# Patient Record
Sex: Female | Born: 1935 | Race: White | Hispanic: No | Marital: Married | Smoking: Never smoker
Health system: Southern US, Community
[De-identification: ages and names within clinical notes are randomized; demographics above are authoritative.]

## PROBLEM LIST (undated history)

## (undated) DIAGNOSIS — K760 Fatty (change of) liver, not elsewhere classified: Secondary | ICD-10-CM

## (undated) DIAGNOSIS — R143 Flatulence: Secondary | ICD-10-CM

## (undated) DIAGNOSIS — I10 Essential (primary) hypertension: Secondary | ICD-10-CM

## (undated) DIAGNOSIS — R0602 Shortness of breath: Secondary | ICD-10-CM

## (undated) DIAGNOSIS — C801 Malignant (primary) neoplasm, unspecified: Secondary | ICD-10-CM

## (undated) DIAGNOSIS — I712 Thoracic aortic aneurysm, without rupture: Secondary | ICD-10-CM

## (undated) DIAGNOSIS — J189 Pneumonia, unspecified organism: Secondary | ICD-10-CM

## (undated) DIAGNOSIS — H269 Unspecified cataract: Secondary | ICD-10-CM

## (undated) DIAGNOSIS — I499 Cardiac arrhythmia, unspecified: Secondary | ICD-10-CM

## (undated) DIAGNOSIS — N951 Menopausal and female climacteric states: Secondary | ICD-10-CM

## (undated) DIAGNOSIS — E7800 Pure hypercholesterolemia, unspecified: Secondary | ICD-10-CM

## (undated) DIAGNOSIS — R112 Nausea with vomiting, unspecified: Secondary | ICD-10-CM

## (undated) DIAGNOSIS — K259 Gastric ulcer, unspecified as acute or chronic, without hemorrhage or perforation: Secondary | ICD-10-CM

## (undated) DIAGNOSIS — K2940 Chronic atrophic gastritis without bleeding: Secondary | ICD-10-CM

## (undated) DIAGNOSIS — R059 Cough, unspecified: Secondary | ICD-10-CM

## (undated) DIAGNOSIS — N390 Urinary tract infection, site not specified: Secondary | ICD-10-CM

## (undated) DIAGNOSIS — K861 Other chronic pancreatitis: Secondary | ICD-10-CM

## (undated) DIAGNOSIS — R141 Gas pain: Secondary | ICD-10-CM

## (undated) DIAGNOSIS — K449 Diaphragmatic hernia without obstruction or gangrene: Secondary | ICD-10-CM

## (undated) DIAGNOSIS — C50919 Malignant neoplasm of unspecified site of unspecified female breast: Secondary | ICD-10-CM

## (undated) DIAGNOSIS — R142 Eructation: Secondary | ICD-10-CM

## (undated) DIAGNOSIS — M1990 Unspecified osteoarthritis, unspecified site: Secondary | ICD-10-CM

## (undated) DIAGNOSIS — T8859XA Other complications of anesthesia, initial encounter: Secondary | ICD-10-CM

## (undated) DIAGNOSIS — T4145XA Adverse effect of unspecified anesthetic, initial encounter: Secondary | ICD-10-CM

## (undated) DIAGNOSIS — R05 Cough: Secondary | ICD-10-CM

## (undated) DIAGNOSIS — R0789 Other chest pain: Secondary | ICD-10-CM

## (undated) DIAGNOSIS — IMO0002 Reserved for concepts with insufficient information to code with codable children: Secondary | ICD-10-CM

## (undated) DIAGNOSIS — Z9889 Other specified postprocedural states: Secondary | ICD-10-CM

## (undated) DIAGNOSIS — K589 Irritable bowel syndrome without diarrhea: Secondary | ICD-10-CM

## (undated) DIAGNOSIS — D126 Benign neoplasm of colon, unspecified: Secondary | ICD-10-CM

## (undated) DIAGNOSIS — Z923 Personal history of irradiation: Secondary | ICD-10-CM

## (undated) DIAGNOSIS — Z800 Family history of malignant neoplasm of digestive organs: Secondary | ICD-10-CM

## (undated) DIAGNOSIS — J029 Acute pharyngitis, unspecified: Secondary | ICD-10-CM

## (undated) DIAGNOSIS — K219 Gastro-esophageal reflux disease without esophagitis: Secondary | ICD-10-CM

## (undated) DIAGNOSIS — J40 Bronchitis, not specified as acute or chronic: Secondary | ICD-10-CM

## (undated) DIAGNOSIS — R490 Dysphonia: Secondary | ICD-10-CM

## (undated) HISTORY — PX: CATARACT EXTRACTION: SUR2

## (undated) HISTORY — DX: Dysphonia: R49.0

## (undated) HISTORY — DX: Malignant neoplasm of unspecified site of unspecified female breast: C50.919

## (undated) HISTORY — DX: Family history of malignant neoplasm of digestive organs: Z80.0

## (undated) HISTORY — DX: Essential (primary) hypertension: I10

## (undated) HISTORY — PX: BREAST SURGERY: SHX581

## (undated) HISTORY — DX: Unspecified cataract: H26.9

## (undated) HISTORY — DX: Flatulence: R14.3

## (undated) HISTORY — DX: Acute pharyngitis, unspecified: J02.9

## (undated) HISTORY — DX: Chronic atrophic gastritis without bleeding: K29.40

## (undated) HISTORY — PX: EYE SURGERY: SHX253

## (undated) HISTORY — DX: Gastric ulcer, unspecified as acute or chronic, without hemorrhage or perforation: K25.9

## (undated) HISTORY — PX: SKIN CANCER EXCISION: SHX779

## (undated) HISTORY — DX: Cough: R05

## (undated) HISTORY — DX: Other chest pain: R07.89

## (undated) HISTORY — DX: Benign neoplasm of colon, unspecified: D12.6

## (undated) HISTORY — PX: COLONOSCOPY: SHX174

## (undated) HISTORY — DX: Thoracic aortic aneurysm, without rupture: I71.2

## (undated) HISTORY — DX: Fatty (change of) liver, not elsewhere classified: K76.0

## (undated) HISTORY — DX: Gastro-esophageal reflux disease without esophagitis: K21.9

## (undated) HISTORY — DX: Other chronic pancreatitis: K86.1

## (undated) HISTORY — DX: Eructation: R14.2

## (undated) HISTORY — DX: Malignant (primary) neoplasm, unspecified: C80.1

## (undated) HISTORY — DX: Pneumonia, unspecified organism: J18.9

## (undated) HISTORY — DX: Urinary tract infection, site not specified: N39.0

## (undated) HISTORY — DX: Flatulence: R14.1

## (undated) HISTORY — DX: Cardiac arrhythmia, unspecified: I49.9

## (undated) HISTORY — DX: Bronchitis, not specified as acute or chronic: J40

## (undated) HISTORY — DX: Cough, unspecified: R05.9

## (undated) HISTORY — PX: HEMORROIDECTOMY: SUR656

## (undated) HISTORY — DX: Reserved for concepts with insufficient information to code with codable children: IMO0002

## (undated) HISTORY — DX: Pure hypercholesterolemia, unspecified: E78.00

## (undated) HISTORY — DX: Irritable bowel syndrome, unspecified: K58.9

## (undated) HISTORY — DX: Diaphragmatic hernia without obstruction or gangrene: K44.9

## (undated) HISTORY — DX: Menopausal and female climacteric states: N95.1

---

## 1997-12-31 ENCOUNTER — Other Ambulatory Visit: Admission: RE | Admit: 1997-12-31 | Discharge: 1997-12-31 | Payer: Self-pay | Admitting: Obstetrics and Gynecology

## 1999-01-02 ENCOUNTER — Other Ambulatory Visit: Admission: RE | Admit: 1999-01-02 | Discharge: 1999-01-02 | Payer: Self-pay | Admitting: Obstetrics and Gynecology

## 1999-08-11 ENCOUNTER — Encounter: Admission: RE | Admit: 1999-08-11 | Discharge: 1999-08-11 | Payer: Self-pay | Admitting: Obstetrics and Gynecology

## 1999-08-11 ENCOUNTER — Encounter: Payer: Self-pay | Admitting: Obstetrics and Gynecology

## 2000-02-12 ENCOUNTER — Other Ambulatory Visit: Admission: RE | Admit: 2000-02-12 | Discharge: 2000-02-12 | Payer: Self-pay | Admitting: Obstetrics and Gynecology

## 2000-10-26 ENCOUNTER — Encounter: Payer: Self-pay | Admitting: Obstetrics and Gynecology

## 2000-10-26 ENCOUNTER — Encounter: Admission: RE | Admit: 2000-10-26 | Discharge: 2000-10-26 | Payer: Self-pay | Admitting: Obstetrics and Gynecology

## 2001-03-22 ENCOUNTER — Other Ambulatory Visit: Admission: RE | Admit: 2001-03-22 | Discharge: 2001-03-22 | Payer: Self-pay | Admitting: Obstetrics and Gynecology

## 2002-04-13 ENCOUNTER — Other Ambulatory Visit: Admission: RE | Admit: 2002-04-13 | Discharge: 2002-04-13 | Payer: Self-pay | Admitting: Obstetrics and Gynecology

## 2003-01-22 ENCOUNTER — Encounter: Payer: Self-pay | Admitting: Obstetrics and Gynecology

## 2003-01-22 ENCOUNTER — Encounter: Admission: RE | Admit: 2003-01-22 | Discharge: 2003-01-22 | Payer: Self-pay | Admitting: Obstetrics and Gynecology

## 2003-01-25 ENCOUNTER — Encounter: Admission: RE | Admit: 2003-01-25 | Discharge: 2003-01-25 | Payer: Self-pay | Admitting: Obstetrics and Gynecology

## 2003-01-25 ENCOUNTER — Encounter: Payer: Self-pay | Admitting: Obstetrics and Gynecology

## 2003-02-26 ENCOUNTER — Encounter: Admission: RE | Admit: 2003-02-26 | Discharge: 2003-02-26 | Payer: Self-pay | Admitting: Obstetrics and Gynecology

## 2003-02-26 ENCOUNTER — Encounter (INDEPENDENT_AMBULATORY_CARE_PROVIDER_SITE_OTHER): Payer: Self-pay | Admitting: *Deleted

## 2003-02-26 ENCOUNTER — Encounter: Payer: Self-pay | Admitting: Obstetrics and Gynecology

## 2003-03-01 ENCOUNTER — Encounter (HOSPITAL_COMMUNITY): Admission: RE | Admit: 2003-03-01 | Discharge: 2003-05-30 | Payer: Self-pay | Admitting: General Surgery

## 2003-03-01 ENCOUNTER — Encounter: Payer: Self-pay | Admitting: General Surgery

## 2003-03-02 ENCOUNTER — Encounter: Payer: Self-pay | Admitting: General Surgery

## 2003-03-20 ENCOUNTER — Encounter (INDEPENDENT_AMBULATORY_CARE_PROVIDER_SITE_OTHER): Payer: Self-pay | Admitting: General Surgery

## 2003-03-20 ENCOUNTER — Encounter (INDEPENDENT_AMBULATORY_CARE_PROVIDER_SITE_OTHER): Payer: Self-pay | Admitting: Specialist

## 2003-03-21 ENCOUNTER — Ambulatory Visit (HOSPITAL_BASED_OUTPATIENT_CLINIC_OR_DEPARTMENT_OTHER): Admission: RE | Admit: 2003-03-21 | Discharge: 2003-03-21 | Payer: Self-pay | Admitting: General Surgery

## 2003-03-21 ENCOUNTER — Encounter: Admission: RE | Admit: 2003-03-21 | Discharge: 2003-03-21 | Payer: Self-pay | Admitting: General Surgery

## 2003-03-21 ENCOUNTER — Encounter: Payer: Self-pay | Admitting: General Surgery

## 2003-03-23 HISTORY — PX: BREAST LUMPECTOMY: SHX2

## 2003-04-27 ENCOUNTER — Ambulatory Visit: Admission: RE | Admit: 2003-04-27 | Discharge: 2003-07-04 | Payer: Self-pay | Admitting: Radiation Oncology

## 2003-05-15 ENCOUNTER — Other Ambulatory Visit: Admission: RE | Admit: 2003-05-15 | Discharge: 2003-05-15 | Payer: Self-pay | Admitting: Obstetrics and Gynecology

## 2003-08-07 ENCOUNTER — Ambulatory Visit: Admission: RE | Admit: 2003-08-07 | Discharge: 2003-08-07 | Payer: Self-pay | Admitting: Radiation Oncology

## 2003-10-30 ENCOUNTER — Encounter: Admission: RE | Admit: 2003-10-30 | Discharge: 2003-10-30 | Payer: Self-pay | Admitting: Oncology

## 2004-01-23 ENCOUNTER — Encounter: Admission: RE | Admit: 2004-01-23 | Discharge: 2004-01-23 | Payer: Self-pay | Admitting: Oncology

## 2004-08-18 ENCOUNTER — Ambulatory Visit: Payer: Self-pay | Admitting: Oncology

## 2004-08-26 ENCOUNTER — Other Ambulatory Visit: Admission: RE | Admit: 2004-08-26 | Discharge: 2004-08-26 | Payer: Self-pay | Admitting: Obstetrics and Gynecology

## 2004-09-23 ENCOUNTER — Ambulatory Visit: Payer: Self-pay | Admitting: Internal Medicine

## 2004-10-13 ENCOUNTER — Ambulatory Visit: Payer: Self-pay | Admitting: Internal Medicine

## 2004-10-28 ENCOUNTER — Ambulatory Visit: Payer: Self-pay | Admitting: Gastroenterology

## 2005-01-23 ENCOUNTER — Encounter: Admission: RE | Admit: 2005-01-23 | Discharge: 2005-01-23 | Payer: Self-pay | Admitting: Oncology

## 2005-02-06 ENCOUNTER — Ambulatory Visit: Payer: Self-pay | Admitting: Oncology

## 2005-07-23 ENCOUNTER — Ambulatory Visit (HOSPITAL_COMMUNITY): Admission: RE | Admit: 2005-07-23 | Discharge: 2005-07-23 | Payer: Self-pay | Admitting: Obstetrics and Gynecology

## 2005-07-23 ENCOUNTER — Encounter (INDEPENDENT_AMBULATORY_CARE_PROVIDER_SITE_OTHER): Payer: Self-pay | Admitting: Specialist

## 2005-09-17 ENCOUNTER — Ambulatory Visit: Payer: Self-pay | Admitting: Oncology

## 2005-10-22 ENCOUNTER — Other Ambulatory Visit: Admission: RE | Admit: 2005-10-22 | Discharge: 2005-10-22 | Payer: Self-pay | Admitting: Obstetrics and Gynecology

## 2006-01-15 ENCOUNTER — Ambulatory Visit: Payer: Self-pay | Admitting: Gastroenterology

## 2006-01-28 ENCOUNTER — Encounter: Admission: RE | Admit: 2006-01-28 | Discharge: 2006-01-28 | Payer: Self-pay | Admitting: Oncology

## 2006-03-04 ENCOUNTER — Ambulatory Visit: Payer: Self-pay | Admitting: Gastroenterology

## 2006-03-05 ENCOUNTER — Ambulatory Visit: Payer: Self-pay | Admitting: Gastroenterology

## 2006-03-10 ENCOUNTER — Ambulatory Visit: Payer: Self-pay | Admitting: Oncology

## 2006-03-10 LAB — CBC WITH DIFFERENTIAL/PLATELET
BASO%: 0.7 % (ref 0.0–2.0)
EOS%: 1 % (ref 0.0–7.0)
HCT: 32.5 % — ABNORMAL LOW (ref 34.8–46.6)
LYMPH%: 43.7 % (ref 14.0–48.0)
MCH: 31.5 pg (ref 26.0–34.0)
MCHC: 34.4 g/dL (ref 32.0–36.0)
MCV: 91.6 fL (ref 81.0–101.0)
MONO%: 8.8 % (ref 0.0–13.0)
NEUT%: 45.8 % (ref 39.6–76.8)
lymph#: 2.2 10*3/uL (ref 0.9–3.3)

## 2006-03-10 LAB — COMPREHENSIVE METABOLIC PANEL
AST: 15 U/L (ref 0–37)
Alkaline Phosphatase: 48 U/L (ref 39–117)
BUN: 15 mg/dL (ref 6–23)
Creatinine, Ser: 0.86 mg/dL (ref 0.40–1.20)
Glucose, Bld: 105 mg/dL — ABNORMAL HIGH (ref 70–99)
Potassium: 4.1 mEq/L (ref 3.5–5.3)
Total Bilirubin: 0.4 mg/dL (ref 0.3–1.2)

## 2006-03-12 ENCOUNTER — Ambulatory Visit: Payer: Self-pay | Admitting: Gastroenterology

## 2006-03-12 ENCOUNTER — Encounter: Payer: Self-pay | Admitting: Gastroenterology

## 2006-03-12 DIAGNOSIS — K2940 Chronic atrophic gastritis without bleeding: Secondary | ICD-10-CM | POA: Insufficient documentation

## 2006-05-11 ENCOUNTER — Ambulatory Visit: Payer: Self-pay | Admitting: Gastroenterology

## 2006-05-11 LAB — CONVERTED CEMR LAB: Folate: >20 ng/mL

## 2006-07-16 ENCOUNTER — Ambulatory Visit: Payer: Self-pay | Admitting: Gastroenterology

## 2006-08-16 ENCOUNTER — Ambulatory Visit: Payer: Self-pay | Admitting: Gastroenterology

## 2006-09-13 ENCOUNTER — Ambulatory Visit: Payer: Self-pay | Admitting: Oncology

## 2006-09-16 LAB — CBC WITH DIFFERENTIAL/PLATELET
Basophils Absolute: 0.1 10*3/uL (ref 0.0–0.1)
EOS%: 0.7 % (ref 0.0–7.0)
Eosinophils Absolute: 0 10*3/uL (ref 0.0–0.5)
HCT: 35.1 % (ref 34.8–46.6)
HGB: 12.2 g/dL (ref 11.6–15.9)
MCH: 31.6 pg (ref 26.0–34.0)
MCV: 90.5 fL (ref 81.0–101.0)
MONO%: 9.5 % (ref 0.0–13.0)
NEUT#: 2.3 10*3/uL (ref 1.5–6.5)
NEUT%: 44.8 % (ref 39.6–76.8)

## 2006-09-16 LAB — COMPREHENSIVE METABOLIC PANEL
AST: 18 U/L (ref 0–37)
Albumin: 4.1 g/dL (ref 3.5–5.2)
Alkaline Phosphatase: 47 U/L (ref 39–117)
BUN: 14 mg/dL (ref 6–23)
Calcium: 8.7 mg/dL (ref 8.4–10.5)
Chloride: 103 mEq/L (ref 96–112)
Creatinine, Ser: 0.85 mg/dL (ref 0.40–1.20)
Glucose, Bld: 92 mg/dL (ref 70–99)
Potassium: 3.9 mEq/L (ref 3.5–5.3)

## 2007-01-31 ENCOUNTER — Encounter: Admission: RE | Admit: 2007-01-31 | Discharge: 2007-01-31 | Payer: Self-pay | Admitting: Oncology

## 2007-03-25 ENCOUNTER — Ambulatory Visit: Payer: Self-pay | Admitting: Oncology

## 2007-03-31 LAB — CBC WITH DIFFERENTIAL/PLATELET
BASO%: 0.6 % (ref 0.0–2.0)
EOS%: 0.3 % (ref 0.0–7.0)
MCH: 31.8 pg (ref 26.0–34.0)
MCHC: 35.1 g/dL (ref 32.0–36.0)
RBC: 3.83 10*6/uL (ref 3.70–5.32)
RDW: 13.8 % (ref 11.3–14.5)
lymph#: 1.6 10*3/uL (ref 0.9–3.3)

## 2007-03-31 LAB — COMPREHENSIVE METABOLIC PANEL
ALT: 10 U/L (ref 0–35)
AST: 17 U/L (ref 0–37)
Albumin: 4.1 g/dL (ref 3.5–5.2)
Calcium: 8.7 mg/dL (ref 8.4–10.5)
Chloride: 101 mEq/L (ref 96–112)
Potassium: 3.9 mEq/L (ref 3.5–5.3)

## 2007-07-01 ENCOUNTER — Ambulatory Visit: Payer: Self-pay | Admitting: Gastroenterology

## 2007-08-13 DIAGNOSIS — C50919 Malignant neoplasm of unspecified site of unspecified female breast: Secondary | ICD-10-CM | POA: Insufficient documentation

## 2007-08-13 DIAGNOSIS — I499 Cardiac arrhythmia, unspecified: Secondary | ICD-10-CM | POA: Insufficient documentation

## 2007-08-13 DIAGNOSIS — K589 Irritable bowel syndrome without diarrhea: Secondary | ICD-10-CM

## 2007-08-13 DIAGNOSIS — R0789 Other chest pain: Secondary | ICD-10-CM

## 2007-08-13 DIAGNOSIS — N951 Menopausal and female climacteric states: Secondary | ICD-10-CM

## 2007-08-13 DIAGNOSIS — J40 Bronchitis, not specified as acute or chronic: Secondary | ICD-10-CM | POA: Insufficient documentation

## 2007-08-13 DIAGNOSIS — R143 Flatulence: Secondary | ICD-10-CM

## 2007-08-13 DIAGNOSIS — K219 Gastro-esophageal reflux disease without esophagitis: Secondary | ICD-10-CM | POA: Insufficient documentation

## 2007-08-13 DIAGNOSIS — R059 Cough, unspecified: Secondary | ICD-10-CM | POA: Insufficient documentation

## 2007-08-13 DIAGNOSIS — E7800 Pure hypercholesterolemia, unspecified: Secondary | ICD-10-CM

## 2007-08-13 DIAGNOSIS — R141 Gas pain: Secondary | ICD-10-CM | POA: Insufficient documentation

## 2007-08-13 DIAGNOSIS — N390 Urinary tract infection, site not specified: Secondary | ICD-10-CM

## 2007-08-13 DIAGNOSIS — R05 Cough: Secondary | ICD-10-CM

## 2007-08-13 DIAGNOSIS — J189 Pneumonia, unspecified organism: Secondary | ICD-10-CM

## 2007-08-13 DIAGNOSIS — I10 Essential (primary) hypertension: Secondary | ICD-10-CM

## 2007-08-13 DIAGNOSIS — R142 Eructation: Secondary | ICD-10-CM

## 2007-09-26 ENCOUNTER — Ambulatory Visit: Payer: Self-pay | Admitting: Oncology

## 2007-10-18 LAB — CBC WITH DIFFERENTIAL/PLATELET
BASO%: 0.8 % (ref 0.0–2.0)
Basophils Absolute: 0 10*3/uL (ref 0.0–0.1)
Eosinophils Absolute: 0 10*3/uL (ref 0.0–0.5)
HCT: 38.1 % (ref 34.8–46.6)
HGB: 13.1 g/dL (ref 11.6–15.9)
LYMPH%: 48.3 % — ABNORMAL HIGH (ref 14.0–48.0)
MCHC: 34.3 g/dL (ref 32.0–36.0)
MONO#: 0.4 10*3/uL (ref 0.1–0.9)
NEUT%: 41.5 % (ref 39.6–76.8)
Platelets: 290 10*3/uL (ref 145–400)
WBC: 4.7 10*3/uL (ref 3.9–10.0)

## 2007-10-18 LAB — COMPREHENSIVE METABOLIC PANEL
BUN: 14 mg/dL (ref 6–23)
CO2: 26 mEq/L (ref 19–32)
Calcium: 9.4 mg/dL (ref 8.4–10.5)
Chloride: 105 mEq/L (ref 96–112)
Creatinine, Ser: 0.77 mg/dL (ref 0.40–1.20)
Glucose, Bld: 128 mg/dL — ABNORMAL HIGH (ref 70–99)
Total Bilirubin: 1.2 mg/dL (ref 0.3–1.2)

## 2007-10-18 LAB — LACTATE DEHYDROGENASE: LDH: 149 U/L (ref 94–250)

## 2008-02-07 ENCOUNTER — Encounter: Admission: RE | Admit: 2008-02-07 | Discharge: 2008-02-07 | Payer: Self-pay | Admitting: Oncology

## 2008-08-13 ENCOUNTER — Encounter: Admission: RE | Admit: 2008-08-13 | Discharge: 2008-08-13 | Payer: Self-pay | Admitting: Oncology

## 2008-10-15 ENCOUNTER — Ambulatory Visit: Payer: Self-pay | Admitting: Oncology

## 2008-10-17 ENCOUNTER — Encounter: Payer: Self-pay | Admitting: Gastroenterology

## 2008-10-17 LAB — COMPREHENSIVE METABOLIC PANEL
ALT: 9 U/L (ref 0–35)
AST: 17 U/L (ref 0–37)
BUN: 10 mg/dL (ref 6–23)
CO2: 25 mEq/L (ref 19–32)
Creatinine, Ser: 0.78 mg/dL (ref 0.40–1.20)
Total Bilirubin: 0.7 mg/dL (ref 0.3–1.2)

## 2008-10-17 LAB — CBC WITH DIFFERENTIAL/PLATELET
BASO%: 0.8 % (ref 0.0–2.0)
EOS%: 1 % (ref 0.0–7.0)
HCT: 36.1 % (ref 34.8–46.6)
LYMPH%: 48.9 % (ref 14.0–49.7)
MCH: 31.3 pg (ref 25.1–34.0)
MCHC: 34 g/dL (ref 31.5–36.0)
NEUT%: 41.2 % (ref 38.4–76.8)
Platelets: 281 10*3/uL (ref 145–400)

## 2009-02-07 ENCOUNTER — Encounter: Admission: RE | Admit: 2009-02-07 | Discharge: 2009-02-07 | Payer: Self-pay | Admitting: Oncology

## 2009-02-15 ENCOUNTER — Encounter: Payer: Self-pay | Admitting: Gastroenterology

## 2009-02-21 ENCOUNTER — Encounter: Admission: RE | Admit: 2009-02-21 | Discharge: 2009-02-21 | Payer: Self-pay | Admitting: Oncology

## 2009-03-19 ENCOUNTER — Ambulatory Visit: Payer: Self-pay | Admitting: Gastroenterology

## 2009-03-19 DIAGNOSIS — R079 Chest pain, unspecified: Secondary | ICD-10-CM

## 2009-04-15 ENCOUNTER — Ambulatory Visit (HOSPITAL_COMMUNITY): Admission: RE | Admit: 2009-04-15 | Discharge: 2009-04-15 | Payer: Self-pay | Admitting: Gastroenterology

## 2009-04-15 ENCOUNTER — Encounter: Payer: Self-pay | Admitting: Gastroenterology

## 2009-06-18 ENCOUNTER — Ambulatory Visit: Payer: Self-pay | Admitting: Gastroenterology

## 2009-10-14 ENCOUNTER — Ambulatory Visit: Payer: Self-pay | Admitting: Oncology

## 2009-10-16 LAB — COMPREHENSIVE METABOLIC PANEL
ALT: 16 U/L (ref 0–35)
AST: 25 U/L (ref 0–37)
Albumin: 3.8 g/dL (ref 3.5–5.2)
Calcium: 8.6 mg/dL (ref 8.4–10.5)
Chloride: 99 mEq/L (ref 96–112)
Creatinine, Ser: 0.72 mg/dL (ref 0.40–1.20)
Glucose, Bld: 100 mg/dL — ABNORMAL HIGH (ref 70–99)
Sodium: 130 mEq/L — ABNORMAL LOW (ref 135–145)
Total Bilirubin: 1.1 mg/dL (ref 0.3–1.2)

## 2009-10-16 LAB — CBC WITH DIFFERENTIAL/PLATELET
BASO%: 1.4 % (ref 0.0–2.0)
HCT: 34.2 % — ABNORMAL LOW (ref 34.8–46.6)
LYMPH%: 41.7 % (ref 14.0–49.7)
MCH: 32.2 pg (ref 25.1–34.0)
MCV: 92.4 fL (ref 79.5–101.0)
MONO#: 0.5 10*3/uL (ref 0.1–0.9)
MONO%: 9.9 % (ref 0.0–14.0)
NEUT%: 46 % (ref 38.4–76.8)
WBC: 5.4 10*3/uL (ref 3.9–10.3)

## 2009-10-23 ENCOUNTER — Encounter: Payer: Self-pay | Admitting: Gastroenterology

## 2010-02-13 ENCOUNTER — Encounter: Payer: Self-pay | Admitting: Gastroenterology

## 2010-02-13 ENCOUNTER — Encounter: Admission: RE | Admit: 2010-02-13 | Discharge: 2010-02-13 | Payer: Self-pay | Admitting: Cardiovascular Disease

## 2010-02-14 ENCOUNTER — Encounter: Admission: RE | Admit: 2010-02-14 | Discharge: 2010-02-14 | Payer: Self-pay | Admitting: Oncology

## 2010-03-21 ENCOUNTER — Telehealth: Payer: Self-pay | Admitting: Gastroenterology

## 2010-04-18 ENCOUNTER — Ambulatory Visit: Payer: Self-pay | Admitting: Oncology

## 2010-04-22 LAB — COMPREHENSIVE METABOLIC PANEL
AST: 21 U/L (ref 0–37)
CO2: 28 mEq/L (ref 19–32)
Glucose, Bld: 88 mg/dL (ref 70–99)
Potassium: 3.8 mEq/L (ref 3.5–5.3)

## 2010-04-22 LAB — CBC WITH DIFFERENTIAL/PLATELET
BASO%: 0.7 % (ref 0.0–2.0)
EOS%: 0.7 % (ref 0.0–7.0)
HCT: 35.9 % (ref 34.8–46.6)
LYMPH%: 41.1 % (ref 14.0–49.7)
MCH: 31.6 pg (ref 25.1–34.0)
MCHC: 34.3 g/dL (ref 31.5–36.0)
MONO%: 9.1 % (ref 0.0–14.0)
NEUT#: 2.7 10*3/uL (ref 1.5–6.5)
RBC: 3.9 10*6/uL (ref 3.70–5.45)
RDW: 13.4 % (ref 11.2–14.5)

## 2010-04-22 LAB — CANCER ANTIGEN 27.29: CA 27.29: 22 U/mL (ref 0–39)

## 2010-04-29 ENCOUNTER — Encounter: Payer: Self-pay | Admitting: Gastroenterology

## 2010-08-16 ENCOUNTER — Other Ambulatory Visit: Payer: Self-pay | Admitting: Oncology

## 2010-08-16 DIAGNOSIS — Z9889 Other specified postprocedural states: Secondary | ICD-10-CM

## 2010-08-17 ENCOUNTER — Encounter: Payer: Self-pay | Admitting: Cardiovascular Disease

## 2010-08-17 ENCOUNTER — Encounter: Payer: Self-pay | Admitting: Oncology

## 2010-08-26 NOTE — Letter (Signed)
[facility]   Imported By: Lennie Odor 12/03/2009 13:57:26  _____________________________________________________________________  External Attachment:    Type:   Image     Comment:   External Document

## 2010-08-26 NOTE — Progress Notes (Signed)
Summary: Med Refill  Phone Note Call from Patient Call back at Home Phone [phone]   Caller: Patient Call For: Dr Jarold Motto Reason for Call: Refill Medication, Talk to Nurse Details for Reason: Med Refill Summary of Call: Pt needs new RX for Domperidone.  Initial call taken by: Dwan Bolt,  March 21, 2010 11:19 AM  Follow-up for Phone Call        Pt given information on [facility].  Pt will contact them re price and call us back and let us know where she wants Rx sent. Follow-up by: Ashok Cordia RN,  March 21, 2010 3:22 PM  Additional Follow-up for Phone Call Additional follow up Details #1::        Pt calling.  Would like rx sent to [facility].  Rx sent as requested. Additional Follow-up by: Ashok Cordia RN,  March 24, 2010 12:07 PM    Prescriptions: DOMPERIDOME 10 MG 1 by mouth tid  #90 x 6   Entered by:   Ashok Cordia RN   Authorized by:   Mardella Layman MD [facility]   Signed by:   Ashok Cordia RN on 03/24/2010   Method used:   Faxed to ...       [facility]* (retail)       [address]       [place], [place]  [address]       Ph: [phone]       Fax: [phone]   RxID:   [id number]

## 2010-08-26 NOTE — Letter (Signed)
Summary: [facility]  [facility]   Imported By: Sherian Rein 06/17/2010 14:42:31  _____________________________________________________________________  External Attachment:    Type:   Image     Comment:   External Document

## 2010-12-09 NOTE — Assessment & Plan Note (Signed)
[facility]                         GASTROENTEROLOGY OFFICE NOTE   NAME:Haley [name], Haley [name]                      MRN:          [id number]  DATE:07/01/2007                            DOB:          January 10, 1934    Mrs. Dsouza continues to complain of gas and bloating, belching, burping,  all made worse by acid suppressive therapy in the form of Nexium and  omeprazole.  She has had a thorough, extensive GI workup, which has been  extensively negative.  She currently is taking a variety of homeopathic  agents, including Digest Gold three times a day and some other ginger  extracts for upper GI complaints.  She has had no anorexia, weight-loss,  or specific hepatobiliary complaints.   All of her laboratory data has been unremarkable, as have radiographic  studies, except for some fatty infiltration of her liver.  She has had  both endoscopy and colonoscopy, which were normal.   EXAMINATION:  She weighs 144 pounds and blood pressure is 126/72, pulse  was 62 and regular.  ABDOMINAL EXAM:  Entirely unremarkable, without organomegaly, masses or  tenderness.  Bowel sounds were normal.  RECTAL EXAM:  Deferred.   ASSESSMENT:  I remain perplexed about Sui's problems, which seem to  be mostly functional in nature.  She has had no response to acid  suppressant therapy, treatment for bacterial overgrowth syndrome, or  treatment with pancreatic extracts.   RECOMMENDATIONS:  I have gone ahead and set Celise up for 24-hour pH  probe testing and esophageal manometry to see if we can get to the  bottom of her problem.  She did not desire any further laboratory tests  at this time.  She has had previous small-bowel biopsy in August of  2007, which was entirely normal, as were celiac antibodies.     Vania Rea. Jarold Motto, MD, Caleen Essex, FAGA  Electronically Signed    DRP/MedQ  DD: 07/01/2007  DT: 07/01/2007  Job #: [id number]   cc:   Brett Canales A. Cleta Alberts, M.D.

## 2010-12-12 NOTE — H&P (Signed)
NAME:  Haley [name], Haley [name]               ACCOUNT NO.:  [id number]   MEDICAL RECORD NO.:  [id number]            PATIENT TYPE:   LOCATION:                                 FACILITY:   PHYSICIAN:  Juluis Mire, M.D.        DATE OF BIRTH:   DATE OF ADMISSION:  DATE OF DISCHARGE:                                HISTORY & PHYSICAL   HISTORY OF PRESENT ILLNESS:  The patient is a 77 year old gravida 2, para 2,  post menopausal female who presents for a hysteroscopy with D&C.   In relation to her present admission, the patient has a history of previous  lumpectomy of the right breast consistent with intraductal carcinoma in  situ.  She underwent radiation therapy and is presently in a study where she  is either receiving Arimidex or Tamoxifen, she is unsure.  Has had some  bleeding.  We attempted to do a saline infusion, ultrasound, endometrium and  homogenous with cystic areas.  Because of cervical stenosis, we could not  get a catheter to pass.  In view of this, the patient now presents for a  hysteroscopy with D&C to rule out endometrial pathology.  Since we do not  know whether she is on Arimidex or Tamoxifen.   ALLERGIES:  No known drug allergies.   MEDICATIONS:  1.  Lipitor.  2.  Verapamil.  3.  Nexium.  4.  Toprol.   PAST MEDICAL HISTORY:  1.  Hypertension.  2.  Bradycardia under active management.  3.  Sees Dr. Tresa Endo of Cardiology.  There is a questionable history of      atherosclerotic cardiovascular disease in the past.  Recent evaluation      was evidently negative.   PAST SURGICAL HISTORY:  1.  Hemorrhoidectomy.  2.  Previous lumpectomy as noted.   OBSTETRIC HISTORY:  Two vaginal deliveries.   SOCIAL HISTORY:  No tobacco or alcohol use.   FAMILY HISTORY:  Noncontributory.   REVIEW OF SYSTEMS:  Noncontributory.   PHYSICAL EXAMINATION:  VITAL SIGNS:  Afebrile, stable vital signs.  HEENT:  Normocephalic.  Pupils equal, round and reactive to light and  accommodation.   Extraocular movements intact.  Sclerae and conjunctivae are  clear.  Oropharynx clear.  NECK:  Without thyromegaly.  BREASTS:  Not examined.  LUNGS:  Clear.  CARDIOVASCULAR:  Regular rate and rhythm.  No murmurs or gallops.  ABDOMEN:  Benign.  No masses, organomegaly or tenderness.  PELVIC:  Normal external genitalia.  Atrophic vaginal changes.  Cervix  slightly stenotic.  Uterus feels to be of normal size and shape.  Adnexa  unremarkable.  EXTREMITIES:  Trace edema.  NEUROLOGICAL:  Grossly within normal limits.   IMPRESSION:  1.  Abnormal postmenopausal bleeding.  Rule out endometrial pathology.  2.  History of previous lumpectomy of right breast.  3.  Hypertension and history of bradycardia.   PLAN:  The patient will undergo hysteroscopy with D&C.  The risks of surgery  have been discussed including the risks of infection.  The risk of  hemorrhage could require transfusion or  possible hysterectomy.  The risk of  uterine perforation could lead to injury to adjacent organs requiring  exploratory surgery.  Risk of deep vein thrombosis and pulmonary embolus.  The patient expressed understanding of indications and risks.      Juluis Mire, M.D.  Electronically Signed     JSM/MEDQ  D:  07/23/2005  T:  07/23/2005  Job:  [id number]

## 2010-12-12 NOTE — Procedures (Signed)
[facility]                            ABDOMINAL ULTRASOUND REPORT   NAME:Haley [name], Haley [name]                      MRN:          [id number]  DATE:03/08/2006                            DOB:          November 18, 1933    ACCESSION NUMBER:  [id number]   READING PHYSICIAN:  Vania Rea. Jarold Motto, MD, Clementeen Graham, FACP   PROCEDURE:  Multiplanar abdominal ultrasound imaging was performed in the  upright, supine, right and left lateral decubitus positions.   RESULTS:  Abdominal aorta appears normal.  Maximal diameter of 2.1 cm.  The  IVC is patent.   The pancreas is not dilated.  There are no focal masses or lesions.  However, the pancreatic duct is slightly dilated measuring 4.3 mm.   Gallbladder is normal.  Wall thickness 2.2 cm. with no pericholecystic fluid  or intraluminal echogenic foci to suggest gallstone disease.   The common bile duct is normal and measures 5.3 mm in maximal diameter  without evidence of intraluminal foci.   The liver - there appears to be rather marked hepatomegaly with rather  marked increased liver echogenicity, but no focal hepatic mass, lesions or  dilated biliary structures.   Kidneys are normal in appearance; right 9.5 cm, left 10.0 cm.   Spleen is normal in size, measuring 10.6 cm without parenchymal lesion.   ASSESSMENT:  This ultrasound exam shows an echodense liver consistent with  fatty infiltration of the liver.  There is no biliary ductal dilatation or  focal fatty mass lesions.  The gallbladder is well imaged and appears  normal.  The pancreas is of normal size, but there is a slightly prominent  pancreatic duct suggestive of chronic pancreatitis.                                   Vania Rea. Jarold Motto, MD, Clementeen Graham, [place]   DRP/MedQ  DD:  03/08/2006  DT:  03/08/2006  Job #:  [id number]

## 2010-12-12 NOTE — Op Note (Signed)
Haley [name], Haley [name]               ACCOUNT NO.:  [id number]   MEDICAL RECORD NO.:  [id number]          PATIENT TYPE:  AMB   LOCATION:  SDC                           FACILITY:  [facility]   PHYSICIAN:  Juluis Mire, M.D.   DATE OF BIRTH:  11-28-1933   DATE OF PROCEDURE:  07/23/2005  DATE OF DISCHARGE:                                 OPERATIVE REPORT   PREOPERATIVE DIAGNOSIS:  Postmenopausal bleeding.  Patient possibly on  tamoxifen, rule out endometrial pathology.   POSTOPERATIVE DIAGNOSIS:  Postmenopausal bleeding.  Patient possibly on  tamoxifen, rule out endometrial pathology.   PROCEDURES:  1.  Paracervical block.  2.  Cervical dilatation.  3.  Hysteroscopy with endometrial biopsies.  4.  Endometrial curettings.   SURGEON:  Juluis Mire, M.D.   ANESTHESIA:  Paracervical block and sedation.   ESTIMATED BLOOD LOSS:  Minimal.   PACKS AND DRAINS:  None.   INTRAOPERATIVE BLOOD REPLACED:  None.   COMPLICATIONS:  None.   INDICATIONS:  As noted in the history and physical.   PROCEDURE:  The patient was taken to the OR and placed in the supine  position.  After a satisfactory level of sedation, the patient was placed in  the dorsal lithotomy position using the Allen stirrups.  At this point in  time the patient was then draped as a sterile field.  A speculum was placed  in the vaginal vault.  The cervix and vagina were cleansed with Betadine.  A  paracervical block was instituted using 1% Nesacaine.  The cervix was  secured with a single-tooth tenaculum.  The uterus sounded to 8 cm.  The  cervix was serially dilated to a size 33 Pratt dilator.  The operative  hysteroscope was introduced, the intrauterine cavity was distended using  sorbitol.  Visualization revealed an atrophic endometrium.  There was no  evidence of excrescences or polyps.  Multiple endometrial biopsies were  obtained along with curettings.  There was no active bleeding or signs of  perforation.  Total deficit  was 40 mL.  The  single-tooth tenaculum and speculum were then removed.  The patient taken of  the dorsal lithotomy position, once alert transferred to the recovery room  in good condition.  Sponge, instrument and needle count reported as correct  by the circulating nurse.      Juluis Mire, M.D.  Electronically Signed     JSM/MEDQ  D:  07/23/2005  T:  07/23/2005  Job:  [id number]

## 2011-02-09 ENCOUNTER — Other Ambulatory Visit: Payer: Self-pay | Admitting: Dermatology

## 2011-02-16 ENCOUNTER — Ambulatory Visit
Admission: RE | Admit: 2011-02-16 | Discharge: 2011-02-16 | Disposition: A | Discharge status: Home / Self Care | Payer: Medicare Other | Source: Ambulatory Visit | Attending: Oncology | Admitting: Oncology

## 2011-02-16 DIAGNOSIS — Z9889 Other specified postprocedural states: Secondary | ICD-10-CM

## 2011-02-24 ENCOUNTER — Other Ambulatory Visit: Payer: Self-pay | Admitting: Cardiovascular Disease

## 2011-02-24 DIAGNOSIS — R911 Solitary pulmonary nodule: Secondary | ICD-10-CM

## 2011-02-25 ENCOUNTER — Ambulatory Visit
Admission: RE | Admit: 2011-02-25 | Discharge: 2011-02-25 | Disposition: A | Discharge status: Home / Self Care | Payer: Medicare Other | Source: Ambulatory Visit | Attending: Cardiovascular Disease | Admitting: Cardiovascular Disease

## 2011-02-25 DIAGNOSIS — R911 Solitary pulmonary nodule: Secondary | ICD-10-CM

## 2011-02-25 MED ORDER — IOHEXOL 300 MG/ML  SOLN
75.0000 mL | Freq: Once | INTRAMUSCULAR | Status: AC | PRN
  Administered 2011-02-25: 75 mL via INTRAVENOUS

## 2011-03-04 ENCOUNTER — Telehealth: Payer: Self-pay | Admitting: Gastroenterology

## 2011-03-04 NOTE — Telephone Encounter (Signed)
Called pharm and advised them that we can not refill meds until she has an office visit not seen since 2010. I have called pt and advised her she made an appt for 03/17/2011 she states that she has not been taking her meds like she should be. She would have been out in [place] of this year if she was taking as rxed. She will come for ov before any more rx is given.

## 2011-03-17 ENCOUNTER — Ambulatory Visit (INDEPENDENT_AMBULATORY_CARE_PROVIDER_SITE_OTHER): Payer: Medicare Other | Admitting: Gastroenterology

## 2011-03-17 ENCOUNTER — Encounter: Payer: Self-pay | Admitting: Gastroenterology

## 2011-03-17 VITALS — BP 122/70 | HR 60 | Ht 66.0 in | Wt 148.8 lb

## 2011-03-17 DIAGNOSIS — K219 Gastro-esophageal reflux disease without esophagitis: Secondary | ICD-10-CM

## 2011-03-17 DIAGNOSIS — Z853 Personal history of malignant neoplasm of breast: Secondary | ICD-10-CM | POA: Insufficient documentation

## 2011-03-17 DIAGNOSIS — Z800 Family history of malignant neoplasm of digestive organs: Secondary | ICD-10-CM | POA: Insufficient documentation

## 2011-03-17 DIAGNOSIS — K224 Dyskinesia of esophagus: Secondary | ICD-10-CM

## 2011-03-17 MED ORDER — AMBULATORY NON FORMULARY MEDICATION: Status: DC

## 2011-03-17 NOTE — Patient Instructions (Signed)
We have sent your Domperidone to the pharmacy in [place].

## 2011-03-17 NOTE — Progress Notes (Signed)
This is a 77 year old Caucasian female with chronic acid reflux and esophageal motility disturbance documented by manometry. She has done well on domperidone 10 mg 3 times a day as needed and Prevacid 15 mg a day. She denies chest pain, dysphagia, or hepatobiliary complaints at this time. Her appetite is good her weight is stable. She is up-to-date on her endoscopy, and is due for followup colonoscopy in January of 2013 per positive family history of colon cancer. She denies a current lower gastrointestinal problems, melena, or hematochezia. Her appetite is good and her weight is stable. He has no other neurological difficulties or bladder control issues.  Current Medications, Allergies, Past Medical History, Past Surgical History, Family History and Social History were reviewed in [employer] record.  Pertinent Review of Systems Negative.. she denies a current pulmonary or cardiovascular symptomatology. No history of Raynaud's phenomenon and or other symptoms of collagen vascular disease. She has had previous mastectomy for breast cancer, radiation therapy, and previous tamoxifen treatment.   Physical Exam: Awake alert no acute distress. Cannot appreciate stigmata of chronic liver disease. Her chest is clear she appears to be in a regular rhythm without murmurs gallops or rubs. I cannot appreciate hepatosplenomegaly, abdominal masses or tenderness or distention. Peripheral extremities unremarkable mental status is normal.    Assessment and Plan: Chronic GERD with associated esophageal motility disturbance, and probable delayed gastric emptying. We will continue all of her medications as listed above with when necessary followup as needed. Colonoscopy will be scheduled in January or February. She does have a pulmonary nodule which is being evaluated by Dr. Levy Pupa pulmonary medicine. No diagnosis found.

## 2011-03-20 ENCOUNTER — Ambulatory Visit (INDEPENDENT_AMBULATORY_CARE_PROVIDER_SITE_OTHER): Payer: Medicare Other | Admitting: Emergency Medicine

## 2011-03-20 ENCOUNTER — Encounter: Payer: Self-pay | Admitting: Emergency Medicine

## 2011-03-20 VITALS — BP 124/76 | HR 50 | Temp 97.8°F | Ht 66.5 in | Wt 151.0 lb

## 2011-03-20 DIAGNOSIS — R918 Other nonspecific abnormal finding of lung field: Secondary | ICD-10-CM

## 2011-03-20 NOTE — Assessment & Plan Note (Signed)
Never smoker, but she is at risk for recurrent breast CA. The stability over 1 year by CT scan is reassuring. She needs another scar for stability in 1 year or sooner if she develops symptoms. She needs a scan at that time for her thoracic aortic aneurysm so we will coordinate.  - follow up CT scan w contrast in July 2013

## 2011-03-20 NOTE — Progress Notes (Signed)
Subjective:    Patient ID: Haley [name], female    DOB: 24-Aug-1933, 77 y.o.   MRN: [id number]  HPI 77 yo never smoker, hx of breast CA '04 that has been followed by Dr Donnie Coffin. F/u MRI last year showed possible thoracic aortic aneurysm, so she underwent CT 02/13/10 that showed 6.83mm R apical nodule and a 1cm LUL ground glass nodule. She is [place] native, never lived in [place] or [place]. No hx sarcoidosis or other nodular disease. She is referred regarding the pulm nodules.  Denies any symptoms   Review of Systems No symptoms.   Past Medical History  Diagnosis Date  . Urinary tract infection, site not specified   . Symptomatic menopausal or female climacteric states   . Fatty liver   . Bronchitis, not specified as acute or chronic   . Hiatal hernia   . Atrophic gastritis without mention of hemorrhage   . Cardiac dysrhythmia, unspecified   . Pneumonia, organism unspecified   . Pure hypercholesterolemia   . Unspecified essential hypertension   . Cough   . Irritable bowel syndrome   . Other chest pain   . Malignant neoplasm of breast (female), unspecified site   . Esophageal reflux   . Chronic pancreatitis   . Flatulence, eructation, and gas pain   . Gastric erosions     [name]  . Irritable bowel syndrome   . Family history of malignant neoplasm of gastrointestinal tract      Family History  Problem Relation Age of Onset  . Colon cancer Father   . Diabetes Paternal Aunt   . Heart disease Mother   . Heart disease Brother      History   Social History  . Marital Status: Married    Spouse Name: N/A    Number of Children: 2  . Years of Education: N/A   Occupational History  . Retired     Programmer, systems   Social History Main Topics  . Smoking status: Never Smoker   . Smokeless tobacco: Never Used  . Alcohol Use: No  . Drug Use: No  . Sexually Active: Not on file   Other Topics Concern  . Not on file   Social History Narrative  . No narrative on file     No Known Allergies   Outpatient Prescriptions Prior to Visit  Medication Sig Dispense Refill  . atorvastatin (LIPITOR) 10 MG tablet Take 20 mg by mouth daily.       Marland Kitchen b complex vitamins tablet Take 1 tablet by mouth daily.        . Cholecalciferol (VITAMIN D3) 2000 UNITS TABS Take 1 tablet by mouth daily.        Boris Lown Oil 300 MG CAPS Take 1 capsule by mouth daily.        . lansoprazole (PREVACID) 15 MG capsule Take 15 mg by mouth daily.        . metoprolol succinate (TOPROL-XL) 25 MG 24 hr tablet Take 1/2 tablet by mouth once a day as needed      . Probiotic Product (PROBIOTIC FORMULA) CAPS Take 1 capsule by mouth daily.        . verapamil (CALAN-SR) 120 MG CR tablet Take 120 mg by mouth at bedtime.        . AMBULATORY NON FORMULARY MEDICATION Domperidone 10 mg  Take one tablet by mouth three times a day  240 tablet  3  . calcium citrate-vitamin D (CITRACAL+D) 315-200 MG-UNIT per tablet  Take 2 tablets by mouth daily.               Objective:   Physical Exam  Gen: Pleasant, well-nourished, in no distress,  normal affect  ENT: No lesions,  mouth clear,  oropharynx clear, no postnasal drip  Neck: No JVD, no TMG, no carotid bruits  Lungs: No use of accessory muscles, no dullness to percussion, clear without rales or rhonchi  Cardiovascular: RRR, heart sounds normal, no murmur or gallops, no peripheral edema  Musculoskeletal: No deformities, no cyanosis or clubbing  Neuro: alert, non focal  Skin: Warm, no lesions or rashes     Assessment & Plan:  Pulmonary nodules Never smoker, but she is at risk for recurrent breast CA. The stability over 1 year by CT scan is reassuring. She needs another scar for stability in 1 year or sooner if she develops symptoms. She needs a scan at that time for her thoracic aortic aneurysm so we will coordinate.  - follow up CT scan w contrast in July 2013

## 2011-03-20 NOTE — Patient Instructions (Signed)
We will set up a repeat CT scan of the chest with contrast to be done in July 2013.  Please follow up with Dr Delton Coombes after your CT scan to review.

## 2011-04-23 ENCOUNTER — Other Ambulatory Visit: Payer: Self-pay | Admitting: Oncology

## 2011-04-23 ENCOUNTER — Encounter (HOSPITAL_BASED_OUTPATIENT_CLINIC_OR_DEPARTMENT_OTHER): Payer: Medicare Other | Admitting: Oncology

## 2011-04-23 DIAGNOSIS — Z853 Personal history of malignant neoplasm of breast: Secondary | ICD-10-CM

## 2011-04-23 DIAGNOSIS — D0590 Unspecified type of carcinoma in situ of unspecified breast: Secondary | ICD-10-CM

## 2011-04-23 LAB — CBC WITH DIFFERENTIAL/PLATELET
Basophils Absolute: 0 10*3/uL (ref 0.0–0.1)
EOS%: 0.7 % (ref 0.0–7.0)
HGB: 11.9 g/dL (ref 11.6–15.9)
LYMPH%: 43.8 % (ref 14.0–49.7)
MCH: 31.5 pg (ref 25.1–34.0)
MCV: 91.2 fL (ref 79.5–101.0)
MONO%: 8.1 % (ref 0.0–14.0)
Platelets: 273 10*3/uL (ref 145–400)
RBC: 3.78 10*6/uL (ref 3.70–5.45)
RDW: 13.6 % (ref 11.2–14.5)

## 2011-04-24 LAB — COMPREHENSIVE METABOLIC PANEL
ALT: <8 U/L (ref 0–35)
AST: 18 U/L (ref 0–37)
Creatinine, Ser: 0.71 mg/dL (ref 0.50–1.10)
Sodium: 141 mEq/L (ref 135–145)
Total Bilirubin: 0.7 mg/dL (ref 0.3–1.2)

## 2011-04-30 ENCOUNTER — Other Ambulatory Visit: Payer: Self-pay | Admitting: Oncology

## 2011-04-30 ENCOUNTER — Encounter (HOSPITAL_BASED_OUTPATIENT_CLINIC_OR_DEPARTMENT_OTHER): Payer: Medicare Other | Admitting: Oncology

## 2011-04-30 DIAGNOSIS — E559 Vitamin D deficiency, unspecified: Secondary | ICD-10-CM

## 2011-04-30 DIAGNOSIS — Z1231 Encounter for screening mammogram for malignant neoplasm of breast: Secondary | ICD-10-CM

## 2011-04-30 DIAGNOSIS — D0590 Unspecified type of carcinoma in situ of unspecified breast: Secondary | ICD-10-CM

## 2011-06-15 ENCOUNTER — Other Ambulatory Visit: Payer: Self-pay | Admitting: Dermatology

## 2011-10-05 ENCOUNTER — Other Ambulatory Visit: Payer: Self-pay | Admitting: Dermatology

## 2011-11-16 ENCOUNTER — Ambulatory Visit (INDEPENDENT_AMBULATORY_CARE_PROVIDER_SITE_OTHER): Payer: Medicare Other | Admitting: Family Medicine

## 2011-11-16 VITALS — BP 121/69 | HR 64 | Temp 98.3°F | Resp 16 | Ht 66.0 in | Wt 146.8 lb

## 2011-11-16 DIAGNOSIS — J40 Bronchitis, not specified as acute or chronic: Secondary | ICD-10-CM

## 2011-11-16 MED ORDER — AMOXICILLIN 875 MG PO TABS: 875.0000 mg | ORAL_TABLET | Freq: Two times a day (BID) | ORAL | Status: AC

## 2011-11-16 MED ORDER — HYDROCODONE-HOMATROPINE 5-1.5 MG/5ML PO SYRP: 5.0000 mL | ORAL_SOLUTION | Freq: Four times a day (QID) | ORAL | Status: AC | PRN

## 2011-11-16 MED ORDER — BENZONATATE 100 MG PO CAPS: ORAL_CAPSULE | ORAL | Status: AC

## 2011-11-16 NOTE — Patient Instructions (Signed)

## 2011-11-16 NOTE — Progress Notes (Signed)
Subjective:  patient is here with about a day history of a respiratory tract infection. Aparantly  it started in her chest and is stayed there. She is coughing up purulent phlegm. She does not smoke. Her son and that her husband had similar but didn't last. Her just seems to have gone. She used to get a lot of infections back when she worked, but has not done badly since she has been retired. She stays pretty active. She does not smoke.  Objective: Pleasant alert [place] female in no acute distress though she does cough some. Her TMs are normal. Throat clear. Neck supple without significant nodes. Chest is clear to auscultation. Heart regular without murmurs.  Assessment  bronchitis, persistent   PLlan:  Antibiotics and cough rx.

## 2012-01-01 ENCOUNTER — Encounter: Payer: Self-pay | Admitting: Gastroenterology

## 2012-01-01 ENCOUNTER — Other Ambulatory Visit (INDEPENDENT_AMBULATORY_CARE_PROVIDER_SITE_OTHER): Payer: Medicare Other

## 2012-01-01 ENCOUNTER — Ambulatory Visit (INDEPENDENT_AMBULATORY_CARE_PROVIDER_SITE_OTHER): Payer: Medicare Other | Admitting: Gastroenterology

## 2012-01-01 VITALS — BP 120/68 | HR 60 | Ht 66.0 in | Wt 143.0 lb

## 2012-01-01 DIAGNOSIS — Z800 Family history of malignant neoplasm of digestive organs: Secondary | ICD-10-CM

## 2012-01-01 DIAGNOSIS — R1310 Dysphagia, unspecified: Secondary | ICD-10-CM

## 2012-01-01 DIAGNOSIS — K224 Dyskinesia of esophagus: Secondary | ICD-10-CM

## 2012-01-01 DIAGNOSIS — K219 Gastro-esophageal reflux disease without esophagitis: Secondary | ICD-10-CM

## 2012-01-01 DIAGNOSIS — R1314 Dysphagia, pharyngoesophageal phase: Secondary | ICD-10-CM

## 2012-01-01 DIAGNOSIS — Z79899 Other long term (current) drug therapy: Secondary | ICD-10-CM

## 2012-01-01 DIAGNOSIS — D649 Anemia, unspecified: Secondary | ICD-10-CM

## 2012-01-01 DIAGNOSIS — B0802 Orf virus disease: Secondary | ICD-10-CM

## 2012-01-01 LAB — IBC PANEL: Saturation Ratios: 31.1 % (ref 20.0–50.0)

## 2012-01-01 LAB — VITAMIN B12: Vitamin B-12: 855 pg/mL (ref 211–911)

## 2012-01-01 MED ORDER — NYSTATIN 100000 UNIT/ML MT SUSP: OROMUCOSAL | Status: DC

## 2012-01-01 MED ORDER — DEXLANSOPRAZOLE 60 MG PO CPDR: 60.0000 mg | DELAYED_RELEASE_CAPSULE | Freq: Every day | ORAL | Status: DC

## 2012-01-01 MED ORDER — MOVIPREP 100 G PO SOLR: 1.0000 | Freq: Once | ORAL | Status: DC

## 2012-01-01 NOTE — Patient Instructions (Signed)
Please go to the basement today for your labs.  Your procedure has been scheduled for 01/18/2012, please follow the seperate instructions.  Stop your Prevacid  Start Dexilant once a day, samples given Use Nystatin swish and swallow three times a day. Your prescription(s) have been sent to you pharmacy, Nystatin, [facility]

## 2012-01-01 NOTE — Progress Notes (Signed)
History of Present Illness:  This is a 78 year old Caucasian female with chronic acid reflux. Previous esophageal manometry has shown lower esophageal sphincter incompetency, normal peristalsis but markedly diminished amplitude of contractions. She is on Prevacid 50 mg a day and continues with symptoms are really hard to discern. She complains of burning in her mouth, occasional dysphagia, increased mucus in her throat, and apparently has had a recent bronchitis with treatment  with amoxicillin per primary care. She previously was on domperidone 10 mg 3 times a day but discontinued this because of mouth soreness. Her family history is positive for colon cancer in her father. Last colonoscopy was 5 years ago. There is no history of hepatitis or pancreatitis. The patient does apparently take a daily multivitamin and vitamin D 3.  I have reviewed this patient's present history, medical and surgical past history, allergies and medications.     ROS: The remainder of the 10 point ROS is negative... no Raynaud's phenomenim or history of collagen vascular disease, other cardiopulmonary or systemic symptoms. Her appetite is good her weight is stable, and she denies a specific food intolerances. Lab review shows a chronic mild anemia, but I cannot see a previous B12 level.     Physical Exam: Blood pressure 120/68, pulse 60 and regular, weight 142 pounds. General well developed well nourished patient in no acute distress, appearing her stated age Eyes PERRLA, no icterus, fundoscopic exam per opthamologist Skin no lesions noted Neck supple, no adenopathy, no thyroid enlargement, no tenderness Chest clear to percussion and auscultation Heart no significant murmurs, gallops or rubs noted Abdomen no hepatosplenomegaly masses or tenderness, BS normal. . Extremities no acute joint lesions, edema, phlebitis or evidence of cellulitis. Neurologic patient oriented x 3, cranial nerves intact, no focal neurologic  deficits noted. Psychological mental status normal and normal affect.  Assessment and plan: Probable worsening acid reflux in a patient with an esophageal motility disorder. She may have an element of Candida esophagitis related to recent antibiotic use. On oropharyngeal exam, I could not see any mucosal lesions or evidence of thrush. I have increased her PPI current to Dexilant 60 mg every morning, and we'll treat her with Mycostatin mouthwash 3 times a day, and followup endoscopy at the time of her colonoscopy per her family history of colon cancer. I have ordered anemia profile for review. She is to continue other medications per primary care. She has had previous surgery for breast carcinoma.  Encounter Diagnoses  Name Primary?  . Family history of malignant neoplasm of gastrointestinal tract Yes  . Esophageal reflux   . Esophageal dysphagia

## 2012-01-18 ENCOUNTER — Ambulatory Visit (AMBULATORY_SURGERY_CENTER): Payer: Medicare Other | Admitting: Gastroenterology

## 2012-01-18 ENCOUNTER — Encounter: Payer: Self-pay | Admitting: Gastroenterology

## 2012-01-18 VITALS — BP 164/82 | HR 65 | Temp 98.4°F | Resp 22 | Ht 66.0 in | Wt 143.0 lb

## 2012-01-18 DIAGNOSIS — K219 Gastro-esophageal reflux disease without esophagitis: Secondary | ICD-10-CM

## 2012-01-18 DIAGNOSIS — Z1211 Encounter for screening for malignant neoplasm of colon: Secondary | ICD-10-CM

## 2012-01-18 DIAGNOSIS — Z8719 Personal history of other diseases of the digestive system: Secondary | ICD-10-CM

## 2012-01-18 DIAGNOSIS — D126 Benign neoplasm of colon, unspecified: Secondary | ICD-10-CM

## 2012-01-18 DIAGNOSIS — Z800 Family history of malignant neoplasm of digestive organs: Secondary | ICD-10-CM

## 2012-01-18 MED ORDER — SODIUM CHLORIDE 0.9 % IV SOLN: 500.0000 mL | INTRAVENOUS | Status: DC

## 2012-01-18 MED ORDER — DEXLANSOPRAZOLE 60 MG PO CPDR: 60.0000 mg | DELAYED_RELEASE_CAPSULE | Freq: Every day | ORAL | Status: DC

## 2012-01-18 NOTE — Progress Notes (Signed)
16;04 Shon Hough, CRNA hung 2nd bag of normal saline 0.9% 50 ml. Maw

## 2012-01-18 NOTE — Progress Notes (Signed)
The pt tolerated the colonoscopy very well. Maw   

## 2012-01-18 NOTE — Op Note (Signed)
[facility] [address] [place], [place]  [address]  ENDOSCOPY PROCEDURE REPORT  PATIENT:  Haley [name], Haley [name]  MR#:  [id number] BIRTHDATE:  1933-12-31, 77 yrs. old  GENDER:  female  ENDOSCOPIST:  Vania Rea. Jarold Motto, MD, [facility] Referred by:  PROCEDURE DATE:  01/18/2012 PROCEDURE:  EGD with biopsy for H. pylori [id number] ASA CLASS:  Class III INDICATIONS:  CHRONIC GERD,,ON PPI RX.  MEDICATIONS:   There was residual sedation effect present from prior procedure., propofol (Diprivan) 150 mg IV TOPICAL ANESTHETIC:  DESCRIPTION OF PROCEDURE:   After the risks and benefits of the procedure were explained, informed consent was obtained.  The [facility] GIF-H180 [id number] endoscope was introduced through the mouth and advanced to the second portion of the duodenum.  The instrument was slowly withdrawn as the mucosa was fully examined. <<PROCEDUREIMAGES>>  The upper, middle, and distal third of the esophagus were carefully inspected and no abnormalities were noted. The z-line was well seen at the GEJ. The endoscope was pushed into the fundus which was normal including a retroflexed view. The antrum,gastric body, first and second part of the duodenum were unremarkable. CLO BX. DONE    Retroflexed views revealed no abnormalities.    The scope was then withdrawn from the patient and the procedure completed.  COMPLICATIONS:  None  ENDOSCOPIC IMPRESSION: 1) Normal EGD GERD ON RX. RECOMMENDATIONS: 1) Await biopsy results 2) Rx CLO if positive 3) continue PPI  ______________________________ Vania Rea. Jarold Motto, MD, Clementeen Graham  CC:  Lesle Chris, MD  n. Rosalie Doctor:   Vania Rea. Javoris Star at 01/18/2012 04:12 PM  Oretha Ellis, [phone]

## 2012-01-18 NOTE — Op Note (Signed)
[facility] [address] [place], [place]  [address]  COLONOSCOPY PROCEDURE REPORT  PATIENT:  Haley [name], Haley [name]  MR#:  [id number] BIRTHDATE:  1934-02-07, 77 yrs. old  GENDER:  female ENDOSCOPIST:  Vania Rea. Jarold Motto, MD, [facility] REF. BY: PROCEDURE DATE:  01/18/2012 PROCEDURE:  Colonoscopy with snare polypectomy ASA CLASS:  Class III INDICATIONS:  Routine Risk Screening, family history of colon cancer MEDICATIONS:   propofol (Diprivan) 200 mg IV  DESCRIPTION OF PROCEDURE:   After the risks and benefits and of the procedure were explained, informed consent was obtained. Digital rectal exam was performed and revealed no abnormalities. The LB CF-[id number] endoscope was introduced through the anus and advanced to the cecum, which was identified by both the appendix and ileocecal valve.  The quality of the prep was excellent, using MoviPrep.  The instrument was then slowly withdrawn as the colon was fully examined. <<PROCEDUREIMAGES>>  FINDINGS:  A sessile polyp was found in the cecum. 5-6 MM FLAT CECAL POLYP HOT SNARE REMOVED.SEE PICTURES.   Retroflexed views in the rectum revealed no abnormalities.    The scope was then withdrawn from the patient and the procedure completed.  COMPLICATIONS:  None ENDOSCOPIC IMPRESSION: 1) Sessile polyp in the cecum R/O ADENOMA RECOMMENDATIONS: 1) Await biopsy results 2) Repeat colonoscopy in 5 years if polyp adenomatous; otherwise 10 years  REPEAT EXAM:  No  ______________________________ Vania Rea. Jarold Motto, MD, Clement[facility] Graham  CC:  Lesle Chris, MD  n. Rosalie Doctor:   Vania Rea. Boni Maclellan at 01/18/2012 04:01 PM  Oretha Ellis, [id number]

## 2012-01-18 NOTE — Progress Notes (Signed)
Patient did not experience any of the following events: a burn prior to discharge; a fall within the facility; wrong site/side/patient/procedure/implant event; or a hospital transfer or hospital admission upon discharge from the facility. (G8907) Patient did not have preoperative order for IV antibiotic SSI prophylaxis. (G8918)  

## 2012-01-18 NOTE — Progress Notes (Signed)
The pt tolerated the egd very well. Maw   

## 2012-01-18 NOTE — Patient Instructions (Signed)

## 2012-01-19 ENCOUNTER — Telehealth: Payer: Self-pay

## 2012-01-19 ENCOUNTER — Telehealth: Payer: Self-pay | Admitting: *Deleted

## 2012-01-19 MED ORDER — OMEPRAZOLE 40 MG PO CPDR: 40.0000 mg | DELAYED_RELEASE_CAPSULE | Freq: Every day | ORAL | Status: DC

## 2012-01-19 NOTE — Telephone Encounter (Signed)
Patient has been advised that we have gotten a note from her insurance company requiring prior authorization for Dexilant. I did attempt to get an authorization but it has been denied as patient states that she has never tried omeprazole in the past. She has tried Nexium and Prevacid. I have advised her that I will send a script for omeprazole for 1 month to see if this controls her symptoms. If not, she is to call back. She verbalizes understanding.

## 2012-01-19 NOTE — Telephone Encounter (Signed)
  Follow up Call-  Call back number 01/18/2012  Post procedure Call Back phone  # [phone]  Permission to leave phone message Yes     Patient questions:  Do you have a fever, pain , or abdominal swelling? no Pain Score  0 *  Have you tolerated food without any problems? yes  Have you been able to return to your normal activities? yes  Do you have any questions about your discharge instructions: Diet   no Medications  no Follow up visit  no  Do you have questions or concerns about your Care? no  Actions: * If pain score is 4 or above: No action needed, pain <4.    Per the pt, "My neck is a little stiff".  She said it does happen some time to her.  I advised her to call back if it does not improved.  The pt said she would call if needed. Maw

## 2012-01-20 NOTE — Addendum Note (Signed)
Addended by: Clide Cliff D on: 01/20/2012 01:48 PM   Modules accepted: Orders

## 2012-01-21 ENCOUNTER — Encounter: Payer: Self-pay | Admitting: Gastroenterology

## 2012-01-26 ENCOUNTER — Encounter: Payer: Self-pay | Admitting: Gastroenterology

## 2012-02-04 ENCOUNTER — Telehealth: Payer: Self-pay

## 2012-02-04 NOTE — Telephone Encounter (Signed)
Called patient's insurance company to attempt to get another PA for Dexilant after she has tried [employer]. [facility] state that we cannot initiate another PA until 60 days after the denial. Faxed this to the pharmacy to let them know of Denial.

## 2012-02-10 ENCOUNTER — Telehealth: Payer: Self-pay | Admitting: Gastroenterology

## 2012-02-10 NOTE — Telephone Encounter (Signed)
STOP ALL REFLUX MEDS AND DO 24H PH AND MANOMETRY

## 2012-02-10 NOTE — Telephone Encounter (Signed)
Gave pt B12 results. Pt reports she still has the same problems she had when she came for her OV and prior to the [facility] in June, 2013. She reports burning in her throat and hoarseness. She was switched from prevacid to Dexilant and back to Prilosec; none helping with the symptoms. She took one bottle of Nystatin as prescribed stating it helped some, but did not clear the problem. Please advise. Thanks.

## 2012-02-11 NOTE — Telephone Encounter (Signed)
lmom for pt to call back

## 2012-02-11 NOTE — Telephone Encounter (Signed)
Dr Jarold Motto, I placed the 2010 report on your desk to review for repeat EM and 24 HR PH. Thanks.

## 2012-02-11 NOTE — Telephone Encounter (Signed)
Informed pt she needs an EM and 24hr Ph Probe and asked if it's OK to order.  Pt stated she had one done last year, but she'll repeat it if needed. She states at that she did not have a problem with hoarseness and burning in her throat. I cannot find anything in EPIC; called Jill in Endo at [facility] who will check for me.

## 2012-02-11 NOTE — Telephone Encounter (Signed)
Spoke with Jesusita Oka at [facility] ENDO who stated pt's last EM and 24 HR Urine was 03/2009. He stated the EM report was basically normal except: proximal esophageal pressure was 15 and the norm is 30 - 180; Upper UES was 156 and the norm is 30-118. There were some problems noted on the [facility] study and he will fax over the report for Dr Jarold Motto to review.

## 2012-02-12 MED ORDER — DEXLANSOPRAZOLE 60 MG PO CPDR: 60.0000 mg | DELAYED_RELEASE_CAPSULE | Freq: Every day | ORAL | Status: AC

## 2012-02-12 NOTE — Telephone Encounter (Signed)
Spoke with pt and informed her of results of the prior studies and that Dr Jarold Motto would like for her to go back on Domperidone. We talked about her severe reflux also. Pt would like to try dexilant again since she was limited to a couple of weeks d/t the cost. If it doesn't help with the soreness and hoarseness, she will restart the domperidone.

## 2012-02-12 NOTE — Telephone Encounter (Signed)
Dr Jarold Motto reviewed the EM and 24 HR PH Probe report from 04/15/2009. Previous esophageal manometry has shown lower esophageal sphincter incompetency, normal peristalsis but markedly diminished amplitude of contractions. Dr Jarold Motto thinks pt should go back on the Domperidone. lmom for pt to call back.

## 2012-02-17 ENCOUNTER — Encounter: Payer: Self-pay | Admitting: Gastroenterology

## 2012-02-17 ENCOUNTER — Ambulatory Visit
Admission: RE | Admit: 2012-02-17 | Discharge: 2012-02-17 | Disposition: A | Discharge status: Home / Self Care | Payer: Medicare Other | Source: Ambulatory Visit | Attending: Oncology | Admitting: Oncology

## 2012-02-17 DIAGNOSIS — Z1231 Encounter for screening mammogram for malignant neoplasm of breast: Secondary | ICD-10-CM

## 2012-03-20 ENCOUNTER — Other Ambulatory Visit: Payer: Self-pay | Admitting: Gastroenterology

## 2012-03-22 ENCOUNTER — Other Ambulatory Visit: Payer: Self-pay | Admitting: Gastroenterology

## 2012-04-20 ENCOUNTER — Telehealth: Payer: Self-pay | Admitting: Gastroenterology

## 2012-04-20 MED ORDER — ESOMEPRAZOLE MAGNESIUM 40 MG PO CPDR: 40.0000 mg | DELAYED_RELEASE_CAPSULE | Freq: Every day | ORAL | Status: DC

## 2012-04-20 NOTE — Telephone Encounter (Signed)
Pt reports she needs a refill on omeprazole; she states it didn't work as well as the [employer], but her insurance will not cover unless she fails Nexium and the omeprazole. Will leave her samples of nexium to try.

## 2012-04-21 ENCOUNTER — Telehealth: Payer: Self-pay | Admitting: *Deleted

## 2012-04-21 NOTE — Telephone Encounter (Signed)
Patient confirmed over the phone the new date and time 

## 2012-05-17 ENCOUNTER — Telehealth: Payer: Self-pay | Admitting: Gastroenterology

## 2012-05-17 MED ORDER — ESOMEPRAZOLE MAGNESIUM 40 MG PO CPDR: 40.0000 mg | DELAYED_RELEASE_CAPSULE | Freq: Every day | ORAL | Status: DC

## 2012-05-17 NOTE — Telephone Encounter (Signed)
Patient requesting that 3 months worth of Nexium be sent in to [facility], informed her that I would do this now so she can pick up today.  She said she is still having a few problems but that her insurance won't cover the Rx she has had in past until she try's and fails other PPI's.

## 2012-05-18 ENCOUNTER — Other Ambulatory Visit: Payer: Medicare Other | Admitting: Lab

## 2012-05-18 ENCOUNTER — Ambulatory Visit (HOSPITAL_BASED_OUTPATIENT_CLINIC_OR_DEPARTMENT_OTHER): Payer: Medicare Other | Admitting: Oncology

## 2012-05-18 VITALS — BP 128/73 | HR 58 | Temp 97.5°F | Resp 20 | Ht 66.0 in | Wt 144.6 lb

## 2012-05-18 DIAGNOSIS — C50919 Malignant neoplasm of unspecified site of unspecified female breast: Secondary | ICD-10-CM

## 2012-05-18 DIAGNOSIS — Z87898 Personal history of other specified conditions: Secondary | ICD-10-CM

## 2012-05-18 NOTE — Progress Notes (Signed)
Hematology and Oncology Follow Up Visit  Haley [name] [id number] Jan 23, 1934 78 y.o. 05/18/2012 4:26 PM   DIAGNOSIS:   PROBLEMS:   1. 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  2. History gastroesophageal reflux disease.  3. History of irritable bowel syndrome.  PAST THERAPY:  As above ; hx of tamoxifen therapy x 5 years  Interim History:  She has been doing well, apart from GI issues for which she has close f/u. She has annual physical in 2 months. She has ongoing issues with palpitations.  Medications: I have reviewed the patient's current medications.  Allergies: No Known Allergies  Past Medical History, Surgical history, Social history, and Family History were reviewed and updated.  Review of Systems: Constitutional:  Negative for fever, chills, night sweats, anorexia, weight loss, pain. Cardiovascular: no chest pain or dyspnea on exertion +palpitations Respiratory: no cough, shortness of breath, or wheezing Neurological: no TIA or stroke symptoms Dermatological: negative ENT: negative Skin Gastrointestinal: negative Genito-Urinary: negative Hematological and Lymphatic: negative Breast: negative Musculoskeletal: negative Remaining ROS negative.  Physical Exam:  Blood pressure 128/73, pulse 58, temperature 97.5 F (36.4 C), resp. rate 20, height 5\' 6"  (1.676 m), weight 144 lb 9.6 oz (65.59 kg).  ECOG: 0   HEENT:  Sclerae anicteric, conjunctivae pink.  Oropharynx clear.  No mucositis or candidiasis.  Nodes:  No cervical, supraclavicular, or axillary lymphadenopathy palpated.  Breast Exam:  Right breast is benign.  No masses, discharge, skin change, or nipple inversion.  Left breast is benign.  No masses, discharge, skin change, or nipple inversion..  Lungs:  Clear to auscultation bilaterally.  No crackles, rhonchi, or wheezes.  Heart:  Regular rate and rhythm.   Abdomen:  Soft, nontender.  Positive bowel sounds.  No organomegaly or masses palpated.  Musculoskeletal:  No focal spinal tenderness to palpation.  Extremities:  Benign.  No peripheral edema or cyanosis.  Skin:  Benign.  Neuro:  Nonfocal.    Lab Results: Lab Results  Component Value Date   WBC 4.9 04/23/2011   HGB 11.9 04/23/2011   HCT 34.5* 04/23/2011   MCV 91.2 04/23/2011   PLT 273 04/23/2011     Chemistry      Component Value Date/Time   NA 141 04/23/2011 1403   NA 141 04/23/2011 1403   K 4.3 04/23/2011 1403   K 4.3 04/23/2011 1403   CL 105 04/23/2011 1403   CL 105 04/23/2011 1403   CO2 26 04/23/2011 1403   CO2 26 04/23/2011 1403   BUN 13 04/23/2011 1403   BUN 13 04/23/2011 1403   CREATININE 0.71 04/23/2011 1403   CREATININE 0.71 04/23/2011 1403      Component Value Date/Time   CALCIUM 8.9 04/23/2011 1403   CALCIUM 8.9 04/23/2011 1403   ALKPHOS 62 04/23/2011 1403   ALKPHOS 62 04/23/2011 1403   AST 18 04/23/2011 1403   AST 18 04/23/2011 1403   ALT <8 04/23/2011 1403   ALT <8 04/23/2011 1403   BILITOT 0.7 04/23/2011 1403   BILITOT 0.7 04/23/2011 1403       Radiological Studies:  .mammogram 02/17/12-wnl   IMPRESSIONS AND PLAN: A 78 y.o. female with   Hx of DCIS s/p lumpectomy, xrt and enrollment in B35. She is doing well and will be seen in 12 months with imaging studies.  Spent more than half the time coordinating care, as well as discussion of BMI and  its implications.      Haley [name] 10/23/20134:26 PM Cell [phone]

## 2012-06-13 ENCOUNTER — Telehealth: Payer: Self-pay | Admitting: Gastroenterology

## 2012-06-13 DIAGNOSIS — R918 Other nonspecific abnormal finding of lung field: Secondary | ICD-10-CM

## 2012-06-14 ENCOUNTER — Telehealth: Payer: Self-pay | Admitting: Gastroenterology

## 2012-06-14 MED ORDER — ESOMEPRAZOLE MAGNESIUM 40 MG PO CPDR: 40.0000 mg | DELAYED_RELEASE_CAPSULE | Freq: Every day | ORAL | Status: DC

## 2012-06-14 NOTE — Telephone Encounter (Signed)
Pt reports she was reading her paperwork from previous visits and she noticed a CT scan has been ordered. Informed pt I will call her back.  Pulmonary nodules - BYRUM,ROBERT S., MD 03/20/2011 5:12 PM Signed  Never smoker, but she is at risk for recurrent breast CA. The stability over 1 year by CT scan is reassuring. She needs another scar for stability in 1 year or sooner if she develops symptoms. She needs a scan at that time for her thoracic aortic aneurysm so we will coordinate.  - follow up CT scan w contrast in July 2013  Dr Delton Coombes this CT scan was never scheduled, but the order is in per OV note above. Do you still want the scan done and can your office contact the pt? Thanks. Graciella Freer, RN for doctors Jarold Motto and Pyrtle.

## 2012-06-14 NOTE — Telephone Encounter (Signed)
Called [facility] at [phone] per patient request because her Nexium needed prior authorization.  Per Dorene Grebe at [employer] patient is covered until 06-14-2013  Case number [id number]  Called patient and told her that Nexium was approved and I will resend to pharmacy   Patient said she is off Dexilant and Omeprazole because they did not help her   Advised patient that she needs to call Dr. Norval Gable nurse if she is not better and Nexium is not helping.  Patient verbalized understanding   Fax confirmation for Nexium will be sent

## 2012-06-15 ENCOUNTER — Other Ambulatory Visit: Payer: Self-pay | Admitting: Dermatology

## 2012-06-15 NOTE — Telephone Encounter (Signed)
Pt aware that CT order has been placed and PCC's will call with appt time and date tomorrow.

## 2012-06-15 NOTE — Telephone Encounter (Signed)
This woman never had her follow up CT scan for pulm nodules (at least I can't find in our system). She needs CT scan with contrast - I will put the order in now, please call her to let her know that we are going to schedule it. Thanks

## 2012-07-04 ENCOUNTER — Other Ambulatory Visit: Payer: Self-pay | Admitting: Dermatology

## 2012-07-05 ENCOUNTER — Ambulatory Visit (INDEPENDENT_AMBULATORY_CARE_PROVIDER_SITE_OTHER): Payer: Medicare Other | Admitting: Internal Medicine

## 2012-07-05 VITALS — BP 126/70 | HR 58 | Temp 97.7°F | Resp 16 | Ht 66.25 in | Wt 147.2 lb

## 2012-07-05 DIAGNOSIS — K219 Gastro-esophageal reflux disease without esophagitis: Secondary | ICD-10-CM

## 2012-07-05 DIAGNOSIS — I498 Other specified cardiac arrhythmias: Secondary | ICD-10-CM

## 2012-07-05 DIAGNOSIS — Z0000 Encounter for general adult medical examination without abnormal findings: Secondary | ICD-10-CM

## 2012-07-05 DIAGNOSIS — R001 Bradycardia, unspecified: Secondary | ICD-10-CM

## 2012-07-05 LAB — POCT UA - MICROSCOPIC ONLY
Bacteria, U Microscopic: NEGATIVE
Casts, Ur, LPF, POC: NEGATIVE
Crystals, Ur, HPF, POC: NEGATIVE
Mucus, UA: NEGATIVE
Yeast, UA: NEGATIVE

## 2012-07-05 LAB — POCT CBC
HCT, POC: 42.1 % (ref 37.7–47.9)
Hemoglobin: 12.6 g/dL (ref 12.2–16.2)
Lymph, poc: 2.6 (ref 0.6–3.4)
MCH, POC: 29 pg (ref 27–31.2)
MCHC: 29.9 g/dL — AB (ref 31.8–35.4)
MPV: 7.8 fL (ref 0–99.8)
POC Granulocyte: 2.6 (ref 2–6.9)
POC LYMPH PERCENT: 46.5 %L (ref 10–50)
POC MID %: 6.4 %M (ref 0–12)
RDW, POC: 14.7 %
WBC: 5.6 10*3/uL (ref 4.6–10.2)

## 2012-07-05 LAB — COMPREHENSIVE METABOLIC PANEL
AST: 22 U/L (ref 0–37)
Alkaline Phosphatase: 69 U/L (ref 39–117)
BUN: 13 mg/dL (ref 6–23)
Creat: 0.77 mg/dL (ref 0.50–1.10)
Glucose, Bld: 80 mg/dL (ref 70–99)
Total Bilirubin: 1 mg/dL (ref 0.3–1.2)

## 2012-07-05 LAB — LIPID PANEL
Cholesterol: 159 mg/dL (ref 0–200)
HDL: 61 mg/dL (ref >39)

## 2012-07-05 LAB — POCT URINALYSIS DIPSTICK
Blood, UA: NEGATIVE
Glucose, UA: NEGATIVE
Nitrite, UA: NEGATIVE
Protein, UA: NEGATIVE
Spec Grav, UA: <=1.005
Urobilinogen, UA: 0.2

## 2012-07-05 NOTE — Progress Notes (Signed)
  Subjective:    Patient ID: Haley [name], female    DOB: Mar 15, 1934, 78 y.o.   MRN: [id number]  HPI Doing well, needs cpe for work Never had personal cpe since started medicare 10 yrs ago. Dr. Tresa Endo takes care of arrythmias, Dr. Jarold Motto many GI issues, derm and opth., other issues Feels good   Review of Systems  Constitutional: Negative.   HENT: Negative.   Eyes: Negative.   Respiratory: Negative.   Cardiovascular: Positive for palpitations.  Gastrointestinal: Negative.   Genitourinary: Positive for enuresis.  Musculoskeletal: Positive for arthralgias.  Neurological: Negative.   Hematological: Negative.   Psychiatric/Behavioral: Negative.        Objective:   Physical Exam  Vitals reviewed. Constitutional: She is oriented to person, place, and time. She appears well-developed and well-nourished.  HENT:  Right Ear: External ear normal.  Left Ear: External ear normal.  Nose: Nose normal.  Mouth/Throat: Oropharynx is clear and moist.  Neck: Neck supple. No tracheal deviation present. No thyromegaly present.  Cardiovascular: Normal rate, regular rhythm and normal heart sounds.   Pulmonary/Chest: Effort normal and breath sounds normal.  Abdominal: Soft. Bowel sounds are normal. She exhibits no mass. There is no tenderness.  Musculoskeletal: Normal range of motion.  Lymphadenopathy:    She has no cervical adenopathy.  Neurological: She is alert and oriented to person, place, and time. She has normal reflexes. No cranial nerve deficit. She exhibits normal muscle tone. Coordination normal.  Skin: Skin is warm.  Psychiatric: She has a normal mood and affect.   EKG bradycardia      Assessment & Plan:  Doing well OK for school Notify Dr. Tresa Endo about bradycardia

## 2012-07-05 NOTE — Progress Notes (Signed)
  Subjective:    Patient ID: Haley [name], female    DOB: Apr 15, 1934, 78 y.o.   MRN: [id number]  HPI    Review of Systems     Objective:   Physical Exam        Assessment & Plan:

## 2012-07-05 NOTE — Progress Notes (Signed)
  Tuberculosis Risk Questionnaire  1. Were you born outside the [place] in one of the following parts of the world:    Af[place], [place], Central [place], [place] America or Eastern [place]?  No  2. Have you traveled outside the [place] and lived for more than one month in one of the following parts of the world:  Af[place], [place], [place], [place] or Eastern [place]?  No  3. Do you have a compromised immune system such as from any of the following conditions:  HIV/AIDS, organ or bone marrow transplantation, diabetes, immunosuppressive   medicines (e.g. Prednisone, Remicaide), leukemia, lymphoma, cancer of the   head or neck, gastrectomy or jejunal bypass, end-stage renal disease (on   dialysis), or silicosis?  No    4. Have you ever done one of the following:    Used crack cocaine, injected illegal drugs, worked or resided in jail or prison,   worked or resided at a homeless shelter, or worked as a healthcare worker in   direct contact with patients?  No  5. Have you ever been exposed to anyone with infectious tuberculosis?  No Tuberculosis Symptom Questionnaire  Do you currently have any of the following symptoms?  1. Unexplained cough lasting more than 3 weeks? No  Unexplained fever lasting more than 3 weeks. No   3. Night Sweats (sweating that leaves the bedclothes and sheets wet)   No  4. Shortness of Breath No  5. Chest Pain No  6. Unintentional weight loss  No  7. Unexplained fatigue (very tired for no reason) No 

## 2012-07-05 NOTE — Patient Instructions (Addendum)

## 2012-07-06 LAB — IFOBT (OCCULT BLOOD): IFOBT: POSITIVE

## 2012-07-10 ENCOUNTER — Encounter: Payer: Self-pay | Admitting: Radiology

## 2012-07-25 ENCOUNTER — Telehealth: Payer: Self-pay | Admitting: Emergency Medicine

## 2012-07-25 DIAGNOSIS — R918 Other nonspecific abnormal finding of lung field: Secondary | ICD-10-CM

## 2012-07-25 NOTE — Telephone Encounter (Signed)
Attempted to contact patient on number provided. No answer. LMOAM for pt to return my call to schedule CT. Rhonda J Cobb

## 2012-07-25 NOTE — Telephone Encounter (Signed)
Please place order for CT Chest. Order from 02/2011 has expired. Thanks. Rhonda J Cobb

## 2012-07-26 ENCOUNTER — Ambulatory Visit (INDEPENDENT_AMBULATORY_CARE_PROVIDER_SITE_OTHER)
Admission: RE | Admit: 2012-07-26 | Discharge: 2012-07-26 | Disposition: A | Discharge status: Home / Self Care | Payer: Medicare Other | Source: Ambulatory Visit | Attending: Emergency Medicine | Admitting: Emergency Medicine

## 2012-07-26 ENCOUNTER — Other Ambulatory Visit: Payer: Medicare Other

## 2012-07-26 DIAGNOSIS — R918 Other nonspecific abnormal finding of lung field: Secondary | ICD-10-CM

## 2012-07-26 MED ORDER — IOHEXOL 300 MG/ML  SOLN
80.0000 mL | Freq: Once | INTRAMUSCULAR | Status: AC | PRN
  Administered 2012-07-26: 80 mL via INTRAVENOUS

## 2012-08-11 ENCOUNTER — Encounter: Payer: Self-pay | Admitting: Emergency Medicine

## 2012-08-11 ENCOUNTER — Ambulatory Visit (INDEPENDENT_AMBULATORY_CARE_PROVIDER_SITE_OTHER): Payer: Medicare Other | Admitting: Emergency Medicine

## 2012-08-11 VITALS — BP 118/72 | HR 56 | Temp 97.7°F | Ht 66.5 in | Wt 150.4 lb

## 2012-08-11 DIAGNOSIS — R918 Other nonspecific abnormal finding of lung field: Secondary | ICD-10-CM

## 2012-08-11 NOTE — Patient Instructions (Addendum)
We will perform a PET scan We will refer your to see Cardiothoracic Surgery to discuss possible removal of your left lung nodule.  Follow with Dr Delton Coombes in 1 month

## 2012-08-11 NOTE — Progress Notes (Signed)
Subjective:    Patient ID: Haley [name], female    DOB: 05/02/1934, 79 y.o.   MRN: [id number] HPI 79 yo never smoker, hx of breast CA '04 that has been followed by Dr Donnie Coffin. F/u MRI last year showed possible thoracic aortic aneurysm, so she underwent CT 02/13/10 that showed 6.29mm R apical nodule and a 1cm LUL ground glass nodule. She is [place] native, never lived in [place] or [place]. No hx sarcoidosis or other nodular disease. She is referred regarding the pulm nodules.  Denies any symptoms  ROV 08/11/12 -- returns to follow CT scan done 07/26/12. Shows interval enlargement of LUL GG nodule to 1.9x1.4cm. RUL nodular opacity is slightly smaller. TAA is unchanged in size 4.5cm. She denies any new sxs, although she did have a viral URI in the spring that caused persistent cough. She has some reflux sx.      Objective:   Physical Exam Filed Vitals:   08/11/12 1547  BP: 118/72  Pulse: 56  Temp: 97.7 F (36.5 C)   Gen: Pleasant, well-nourished, in no distress,  normal affect  ENT: No lesions,  mouth clear,  oropharynx clear, no postnasal drip  Neck: No JVD, no TMG, no carotid bruits  Lungs: No use of accessory muscles, no dullness to percussion, clear without rales or rhonchi  Cardiovascular: RRR, heart sounds normal, no murmur or gallops, no peripheral edema  Musculoskeletal: No deformities, no cyanosis or clubbing  Neuro: alert, non focal  Skin: Warm, no lesions or rashes    CT scan chest 07/26/12 --  Comparison: Chest CT 02/25/2011.  Findings:  Mediastinum: Ascending thoracic aortic aneurysm is similar in size  measuring 4.5 cm. Heart size is normal. There is no significant  pericardial fluid, thickening or pericardial calcification. There  is atherosclerosis of the thoracic aorta, the great vessels of the  mediastinum and the coronary arteries, including calcified  atherosclerotic plaque in the right coronary arteries. No  pathologically enlarged mediastinal or  hilar lymph nodes. Esophagus  is unremarkable in appearance.  Lungs/Pleura: Compared to prior examinations there is continued  interval growth of a the ground-glass attenuation nodule in the  periphery of the left upper lobe which currently measures 1.9 x 1.4  cm (image 8 of series 3). This has no definite soft tissue  component on the soft tissue windows. Previously noted linear  nodular opacity in the right apex is slightly more prominent, but  only measures 7 mm. No other definite new or enlarging suspicious  appearing pulmonary nodules or masses are confidently identified.  No acute consolidative airspace disease. No pleural effusions.  There is a small amount of chronic scarring in the right middle  lobe and lingula.  Upper Abdomen: Unremarkable.  Musculoskeletal: There are no aggressive appearing lytic or blastic  lesions noted in the visualized portions of the skeleton.  IMPRESSION:  1. Slight interval growth of a pure ground-glass attenuation  nodule in the left upper lobe which currently measures 1.9 x 1.4  cm. This remains suspicious for a slow-growing neoplasm such as a  low grade adenocarcinoma in situ, and consideration for surgical  resection may be warranted depending on the patient's clinical  status. At the very least, continued attention on follow-up  imaging is recommended with a repeat CT scan in 1 year.  2. Linear opacity in the right apex is very similar, measuring  only 7 mm on today's examination.  3. Mild aneurysmal dilatation of the ascending thoracic aorta (4.5  cm  in diameter) is similar to prior.      Assessment & Plan:  Pulmonary nodules LUL ground glass nodule has enlarged after 1.5 yrs, no real solid component. I believe she could tolerate surgery if she agreed to it. - check PET scan - refer to [facility] to discuss possible primary resection.  - if she doesn't want sgy right now then I will follow serial scans.  - rov 1 month

## 2012-08-11 NOTE — Assessment & Plan Note (Signed)
LUL ground glass nodule has enlarged after 1.5 yrs, no real solid component. I believe she could tolerate surgery if she agreed to it. - check PET scan - refer to [facility] to discuss possible primary resection.  - if she doesn't want sgy right now then I will follow serial scans.  - rov 1 month

## 2012-08-29 ENCOUNTER — Telehealth: Payer: Self-pay | Admitting: Emergency Medicine

## 2012-08-29 ENCOUNTER — Encounter (HOSPITAL_COMMUNITY)
Admission: RE | Admit: 2012-08-29 | Discharge: 2012-08-29 | Disposition: A | Discharge status: Home / Self Care | Payer: Medicare Other | Source: Ambulatory Visit | Attending: Emergency Medicine | Admitting: Emergency Medicine

## 2012-08-29 DIAGNOSIS — R911 Solitary pulmonary nodule: Secondary | ICD-10-CM

## 2012-08-29 DIAGNOSIS — I712 Thoracic aortic aneurysm, without rupture, unspecified: Secondary | ICD-10-CM

## 2012-08-29 DIAGNOSIS — J984 Other disorders of lung: Secondary | ICD-10-CM | POA: Insufficient documentation

## 2012-08-29 DIAGNOSIS — R918 Other nonspecific abnormal finding of lung field: Secondary | ICD-10-CM

## 2012-08-29 HISTORY — DX: Thoracic aortic aneurysm, without rupture, unspecified: I71.20

## 2012-08-29 HISTORY — DX: Thoracic aortic aneurysm, without rupture: I71.2

## 2012-08-29 LAB — GLUCOSE, CAPILLARY: Glucose-Capillary: 84 mg/dL (ref 70–99)

## 2012-08-29 MED ORDER — FLUDEOXYGLUCOSE F - 18 (FDG) INJECTION
16.7000 | Freq: Once | INTRAVENOUS | Status: AC | PRN
  Administered 2012-08-29: 16.7 via INTRAVENOUS

## 2012-08-29 NOTE — Telephone Encounter (Signed)
Reviewed PET scan with pt. She has consult w Dr Dorris Fetch on Thursday to consider resection. I will get PFT so the info is available for Dr Dorris Fetch to review

## 2012-08-31 ENCOUNTER — Encounter (HOSPITAL_COMMUNITY)
Admission: RE | Admit: 2012-08-31 | Discharge: 2012-08-31 | Disposition: A | Discharge status: Home / Self Care | Payer: Medicare Other | Source: Ambulatory Visit | Attending: Emergency Medicine | Admitting: Emergency Medicine

## 2012-08-31 DIAGNOSIS — R942 Abnormal results of pulmonary function studies: Secondary | ICD-10-CM | POA: Insufficient documentation

## 2012-08-31 DIAGNOSIS — R911 Solitary pulmonary nodule: Secondary | ICD-10-CM

## 2012-08-31 DIAGNOSIS — R05 Cough: Secondary | ICD-10-CM | POA: Insufficient documentation

## 2012-08-31 DIAGNOSIS — R059 Cough, unspecified: Secondary | ICD-10-CM | POA: Insufficient documentation

## 2012-08-31 DIAGNOSIS — R0609 Other forms of dyspnea: Secondary | ICD-10-CM | POA: Insufficient documentation

## 2012-08-31 DIAGNOSIS — Z01811 Encounter for preprocedural respiratory examination: Secondary | ICD-10-CM | POA: Insufficient documentation

## 2012-08-31 DIAGNOSIS — R0989 Other specified symptoms and signs involving the circulatory and respiratory systems: Secondary | ICD-10-CM | POA: Insufficient documentation

## 2012-08-31 LAB — PULMONARY FUNCTION TEST

## 2012-08-31 MED ORDER — ALBUTEROL SULFATE (5 MG/ML) 0.5% IN NEBU
2.5000 mg | INHALATION_SOLUTION | Freq: Once | RESPIRATORY_TRACT | Status: AC
  Administered 2012-08-31: 2.5 mg via RESPIRATORY_TRACT

## 2012-09-01 ENCOUNTER — Encounter: Payer: Self-pay | Admitting: Thoracic Surgery (Cardiothoracic Vascular Surgery)

## 2012-09-01 ENCOUNTER — Encounter: Payer: Self-pay | Admitting: *Deleted

## 2012-09-01 ENCOUNTER — Institutional Professional Consult (permissible substitution) (INDEPENDENT_AMBULATORY_CARE_PROVIDER_SITE_OTHER): Payer: Medicare Other | Admitting: Thoracic Surgery (Cardiothoracic Vascular Surgery)

## 2012-09-01 VITALS — BP 149/81 | HR 64 | Resp 20 | Ht 66.5 in | Wt 150.0 lb

## 2012-09-01 DIAGNOSIS — R911 Solitary pulmonary nodule: Secondary | ICD-10-CM

## 2012-09-01 NOTE — Progress Notes (Signed)
PCP is DAUB, Stan Head, MD Referring Provider is Leslye Peer, MD  Chief Complaint  Patient presents with  . Lung Lesion    eval and treat...CT CHEST 07/26/12.Marland KitchenMarland KitchenPET 08/29/12    HPI: 79 year old [name] presents with chief complaint of a lung mass.  [name], who is a lifelong nonsmoker. She has a history of breast cancer. She had an MRI done as part of her evaluation which showed an ascending aortic aneurysm. This led to a CT scan. The aneurysm was 4.5 cm on CT in 2011. She recently had a repeat CT which showed no change in the aneurysm. However it did show that a small groundglass opacity in the left upper lobe increased in size in the 18 month interval since the previous scan. A 7 mm area of nodularity in the right upper lobe was unchanged and likely represents scar. A PET CT showed the left upper lobe lesion to be hypermetabolic. She saw Dr. Delton Coombes and now is referred for surgical consideration.  As noted she is a lifelong nonsmoker. She says that she does sometimes get short of breath with exertion particularly she walks rapidly up stairs. She does have a history of supraventricular tachycardia and palpitations. He's not had any history of coronary disease. She has had a cough, but no hemoptysis, weight loss, change in appetite, or change in energy level.   Past Medical History  Diagnosis Date  . Urinary tract infection, site not specified   . Symptomatic menopausal or female climacteric states   . Fatty liver   . Bronchitis, not specified as acute or chronic   . Hiatal hernia   . Atrophic gastritis without mention of hemorrhage   . Cardiac dysrhythmia, unspecified   . Pneumonia, organism unspecified   . Pure hypercholesterolemia   . Unspecified essential hypertension   . Cough   . Irritable bowel syndrome   . Other chest pain   . Malignant neoplasm of breast (female), unspecified site   . Esophageal reflux   . Chronic pancreatitis   . Flatulence,  eructation, and gas pain   . Gastric erosions     Haley [name]  . Irritable bowel syndrome   . Family history of malignant neoplasm of gastrointestinal tract   . Sorethroat     since virus 2 months ago  . Hoarseness     since virus 2 months agp    Past Surgical History  Procedure Date  . Mastectomy, partial 2004    right   . Hemorroidectomy   . Cataract extraction     Bilateral  . Colonoscopy     Family History  Problem Relation Age of Onset  . Colon cancer Father 41  . Diabetes Paternal Aunt   . Heart disease Mother   . Heart disease Brother   . Cancer Brother     mass behind lung-died age 59  . Multiple myeloma Brother 32    died age 49    Social History History  Substance Use Topics  . Smoking status: Never Smoker   . Smokeless tobacco: Never Used  . Alcohol Use: No    Current Outpatient Prescriptions  Medication Sig Dispense Refill  . atorvastatin (LIPITOR) 10 MG tablet Take 20 mg by mouth daily.       Marland Kitchen b complex vitamins tablet Take 1 tablet by mouth daily.        . Cholecalciferol (VITAMIN D3) 2000 UNITS TABS Take 1 tablet by mouth daily.        Marland Kitchen  esomeprazole (NEXIUM) 40 MG capsule Take 1 capsule (40 mg total) by mouth daily before breakfast.  30 capsule  4  . metoprolol succinate (TOPROL-XL) 25 MG 24 hr tablet Take 1/2 tablet by mouth once a day as needed      . Probiotic Product (PROBIOTIC FORMULA) CAPS Take 1 capsule by mouth daily.        . verapamil (CALAN-SR) 120 MG CR tablet Take 120 mg by mouth at bedtime.          No Known Allergies  Review of Systems  Constitutional: Negative.   HENT: Positive for hearing loss.   Respiratory: Positive for cough and shortness of breath. Negative for wheezing.   Cardiovascular: Positive for palpitations. Negative for chest pain and leg swelling.       Atrial fib  Genitourinary: Positive for frequency.  Musculoskeletal: Positive for joint swelling.       Leg cramps  Hematological: Bruises/bleeds easily.        History of breast cancer  All other systems reviewed and are negative.    BP 149/81  Pulse 64  Resp 20  Ht 5' 6.5" (1.689 m)  Wt 150 lb (68.04 kg)  BMI 23.85 kg/m2  SpO2 98% Physical Exam  Vitals reviewed. Constitutional: She is oriented to person, place, and time. She appears well-developed and well-nourished. No distress.  HENT:  Head: Normocephalic and atraumatic.  Eyes: EOM are normal. Pupils are equal, round, and reactive to light.  Neck: Neck supple. No thyromegaly present.  Cardiovascular: Normal rate, regular rhythm and normal heart sounds.  Exam reveals no friction rub.   No murmur heard. Pulmonary/Chest: Effort normal and breath sounds normal. She has no wheezes. She has no rales.  Abdominal: Soft. There is no tenderness.  Musculoskeletal: Normal range of motion. She exhibits no edema.  Lymphadenopathy:    She has no cervical adenopathy.  Neurological: She is alert and oriented to person, place, and time. No cranial nerve deficit.  Skin: Skin is warm and dry.     Diagnostic Tests: CT chest 07/26/13 CT CHEST WITH CONTRAST  Technique: Multidetector CT imaging of the chest was performed  following the standard protocol during bolus administration of  intravenous contrast.  Contrast: 80mL OMNIPAQUE IOHEXOL 300 MG/ML SOLN  Comparison: Chest CT 02/25/2011.  Findings:  Mediastinum: Ascending thoracic aortic aneurysm is similar in size  measuring 4.5 cm. Heart size is normal. There is no significant  pericardial fluid, thickening or pericardial calcification. There  is atherosclerosis of the thoracic aorta, the great vessels of the  mediastinum and the coronary arteries, including calcified  atherosclerotic plaque in the right coronary arteries. No  pathologically enlarged mediastinal or hilar lymph nodes. Esophagus  is unremarkable in appearance.  Lungs/Pleura: Compared to prior examinations there is continued  interval growth of a the ground-glass attenuation  nodule in the  periphery of the left upper lobe which currently measures 1.9 x 1.4  cm (image 8 of series 3). This has no definite soft tissue  component on the soft tissue windows. Previously noted linear  nodular opacity in the right apex is slightly more prominent, but  only measures 7 mm. No other definite new or enlarging suspicious  appearing pulmonary nodules or masses are confidently identified.  No acute consolidative airspace disease. No pleural effusions.  There is a small amount of chronic scarring in the right middle  lobe and lingula.  Upper Abdomen: Unremarkable.  Musculoskeletal: There are no aggressive appearing lytic or blastic  lesions  noted in the visualized portions of the skeleton.  IMPRESSION:  1. Slight interval growth of a pure ground-glass attenuation  nodule in the left upper lobe which currently measures 1.9 x 1.4  cm. This remains suspicious for a slow-growing neoplasm such as a  low grade adenocarcinoma in situ, and consideration for surgical  resection may be warranted depending on the patient's clinical  status. At the very least, continued attention on follow-up  imaging is recommended with a repeat CT scan in 1 year.  2. Linear opacity in the right apex is very similar, measuring  only 7 mm on today's examination.  3. Mild aneurysmal dilatation of the ascending thoracic aorta (4.5  cm in diameter) is similar to prior.  PET/CT 08/29/12 NUCLEAR MEDICINE PET SKULL BASE TO THIGH  Fasting Blood Glucose: 84  Technique: 16.7 mCi F-18 FDG was injected intravenously. CT data  was obtained and used for attenuation correction and anatomic  localization only. (This was not acquired as a diagnostic CT  examination.) Additional exam technical data entered on  technologist worksheet.  Comparison: CT 07/26/2012  Findings:  Neck: No hypermetabolic lymph nodes or mass within the soft tissues  of the neck.  Chest: Similar appearance of the ascending thoracic aortic   aneurysm. No hypermetabolic mediastinal or hilar nodes. There is  a pure ground-glass nodule within the left upper lobe which  measures 1.4 cm, image 53. On the corresponding PET CT images  there is mild increased FDG uptake within SUV max equal to 2.3.  Stable linear density within the medial right lung apex measuring  approximately 7 mm. No significant FDG uptake is associated with  this density.  Abdomen/Pelvis: No abnormal hypermetabolic activity within the  liver, pancreas, adrenal glands, or spleen. No hypermetabolic  lymph nodes in the abdomen or pelvis.  Skeleton: No focal hypermetabolic activity to suggest skeletal  metastasis. Spondylosis noted within the cervical thoracic and  lumbar spine.  IMPRESSION:  1. Low level, malignant range FDG uptake is associated with the  pure ground-glass nodule within the left upper lobe. The SUV max  is equal to 2.5. Generally, PET CT is of limited value in  evaluating pure ground-glass nodules and is therefore not typically  recommended.However, these finding are worrisome for low grade  pulmonary adenocarcinoma and surgical consultation should be  considered.   Impression:  Haley [name] is a 79 year old [name] who has an incidentally noted groundglass opacity in her left upper lobe. This has grown in size in the 18 months since her last CT scan. It was hypermetabolic by PET. The most likely scenario given her age is that this is an adenocarcinoma in situ or adenocarcinoma with lepidic growth. She is a nonsmoker but that type of tumor often occurs without any history of tobacco use. Given that the lesion is hypermetabolic on PET, I would not recommend continued radiographic followup. I think she would be best treated with surgical resection. If this does turn out to be an adenocarcinoma in situ type of lesion then a wide wedge resection should be curative although she would need to continue to be followed.  I discussed with the patient and her son  the nature of the proposed surgical procedure, including the need for general anesthesia, the incisions to be used, expected hospital stay, and the overall recovery. I discussed with them the indications, risks, benefits, and alternatives. They understand the risk include, but are not limited to, death, PE, DVT, MI, bleeding, possible need for transfusion, infection, prolonged  air leak, cardiac arrhythmias, or other unforeseeable complications.  She is uncertain as to whether she would want to proceed with surgery. She wishes to have some time to think this over. If she decides to proceed with surgery we can get it scheduled in a short period of time. I did ask her to let us know she opts not to have surgical resection as I would like to speak with her again before she totally ruled out the possibility. My impression is that she will proceed with surgery but just wants to think it over prior to scheduling the procedure.   Plan: Patient will call to let us know whether or not she was to proceed with surgical resection.

## 2012-09-01 NOTE — Progress Notes (Signed)
Spoke with pt at [name] today.  Educational information given and explained regarding lung surgery.  Questions and concerns addressed

## 2012-09-06 ENCOUNTER — Other Ambulatory Visit: Payer: Self-pay | Admitting: *Deleted

## 2012-09-06 DIAGNOSIS — R918 Other nonspecific abnormal finding of lung field: Secondary | ICD-10-CM

## 2012-09-09 ENCOUNTER — Encounter (HOSPITAL_COMMUNITY): Payer: Self-pay | Admitting: Pharmacy Technician

## 2012-09-09 ENCOUNTER — Telehealth: Payer: Self-pay | Admitting: *Deleted

## 2012-09-09 NOTE — Telephone Encounter (Signed)
Spoke with pt regarding upcoming surgical procedure. She stated she did not have any questions at this time.

## 2012-09-15 ENCOUNTER — Encounter (HOSPITAL_COMMUNITY)
Admission: RE | Admit: 2012-09-15 | Discharge: 2012-09-15 | Disposition: A | Discharge status: Home / Self Care | Payer: Medicare Other | Source: Ambulatory Visit | Attending: Thoracic Surgery (Cardiothoracic Vascular Surgery) | Admitting: Thoracic Surgery (Cardiothoracic Vascular Surgery)

## 2012-09-15 ENCOUNTER — Encounter (HOSPITAL_COMMUNITY): Payer: Self-pay

## 2012-09-15 ENCOUNTER — Other Ambulatory Visit (HOSPITAL_COMMUNITY): Payer: Medicare Other

## 2012-09-15 VITALS — BP 119/75 | HR 61 | Temp 97.5°F | Resp 20 | Ht 66.0 in | Wt 145.7 lb

## 2012-09-15 DIAGNOSIS — R918 Other nonspecific abnormal finding of lung field: Secondary | ICD-10-CM

## 2012-09-15 HISTORY — DX: Other complications of anesthesia, initial encounter: T88.59XA

## 2012-09-15 HISTORY — DX: Unspecified osteoarthritis, unspecified site: M19.90

## 2012-09-15 HISTORY — DX: Other specified postprocedural states: Z98.890

## 2012-09-15 HISTORY — DX: Other specified postprocedural states: R11.2

## 2012-09-15 HISTORY — DX: Shortness of breath: R06.02

## 2012-09-15 HISTORY — DX: Adverse effect of unspecified anesthetic, initial encounter: T41.45XA

## 2012-09-15 LAB — BLOOD GAS, ARTERIAL
Acid-Base Excess: 0.5 mmol/L (ref 0.0–2.0)
Drawn by: 206361
O2 Saturation: 96.8 %
pCO2 arterial: 43.6 mmHg (ref 35.0–45.0)
pO2, Arterial: 87.5 mmHg (ref 80.0–100.0)

## 2012-09-15 LAB — APTT: aPTT: 31 seconds (ref 24–37)

## 2012-09-15 LAB — COMPREHENSIVE METABOLIC PANEL
ALT: 13 U/L (ref 0–35)
AST: 27 U/L (ref 0–37)
Albumin: 3.8 g/dL (ref 3.5–5.2)
Calcium: 9.1 mg/dL (ref 8.4–10.5)
Creatinine, Ser: 0.71 mg/dL (ref 0.50–1.10)
Sodium: 136 mEq/L (ref 135–145)

## 2012-09-15 LAB — CBC
MCH: 31.1 pg (ref 26.0–34.0)
MCV: 90 fL (ref 78.0–100.0)
Platelets: 259 10*3/uL (ref 150–400)
RDW: 13.6 % (ref 11.5–15.5)
WBC: 5.1 10*3/uL (ref 4.0–10.5)

## 2012-09-15 LAB — TYPE AND SCREEN: Antibody Screen: NEGATIVE

## 2012-09-15 LAB — SURGICAL PCR SCREEN: Staphylococcus aureus: POSITIVE — AB

## 2012-09-15 LAB — URINALYSIS, ROUTINE W REFLEX MICROSCOPIC
Bilirubin Urine: NEGATIVE
Nitrite: NEGATIVE
Protein, ur: NEGATIVE mg/dL
Specific Gravity, Urine: 1.01 (ref 1.005–1.030)
Urobilinogen, UA: 0.2 mg/dL (ref 0.0–1.0)

## 2012-09-15 LAB — PROTIME-INR: Prothrombin Time: 12.9 seconds (ref 11.6–15.2)

## 2012-09-15 NOTE — Pre-Procedure Instructions (Signed)
Haley [name]  09/15/2012   Your procedure is scheduled on:  Monday, February 24th.  Report to [facility] at 5:30 AM.  Call this number if you have problems the morning of surgery: [phone]   Remember:   Do not eat food or drink liquids after midnight.    Take these medicines the morning of surgery with A SIP OF WATER: Esomeprazole (Nexium) and Metoprolol (Toprolol).    Do not wear jewelry, make-up or nail polish.  Do not wear lotions, powders, or perfumes. You may wear deodorant.  Do not shave 48 hours prior to surgery.  Do not bring valuables to the hospital.  Contacts, dentures or bridgework may not be worn into surgery.  Leave suitcase in the car. After surgery it may be brought to your room.  For patients admitted to the hospital, checkout time is 11:00 AM the day of  discharge.     Special Instructions: Shower using CHG 2 nights before surgery and the night before surgery.  If you shower the day of surgery use CHG.  Use special wash - you have one bottle of CHG for all showers.  You should use approximately 1/3 of the bottle for each shower. N/A   Please read over the following fact sheets that you were given: Pain Booklet, Coughing and Deep Breathing, Blood Transfusion Information and Surgical Site Infection Prevention

## 2012-09-18 MED ORDER — DEXTROSE 5 % IV SOLN
1.5000 g | INTRAVENOUS | Status: AC
  Administered 2012-09-19: 1.5 g via INTRAVENOUS
  Filled 2012-09-18: qty 1.5

## 2012-09-19 ENCOUNTER — Inpatient Hospital Stay (HOSPITAL_COMMUNITY): Payer: Medicare Other

## 2012-09-19 ENCOUNTER — Encounter (HOSPITAL_COMMUNITY): Payer: Self-pay | Admitting: Anesthesiology

## 2012-09-19 ENCOUNTER — Inpatient Hospital Stay (HOSPITAL_COMMUNITY)
Admission: RE | Admit: 2012-09-19 | Discharge: 2012-09-21 | DRG: 164 | Disposition: A | Discharge status: Home / Self Care | Payer: Medicare Other | Source: Ambulatory Visit | Attending: Thoracic Surgery (Cardiothoracic Vascular Surgery) | Admitting: Thoracic Surgery (Cardiothoracic Vascular Surgery)

## 2012-09-19 ENCOUNTER — Encounter (HOSPITAL_COMMUNITY)
Admission: RE | Disposition: A | Discharge status: Home / Self Care | Payer: Self-pay | Source: Ambulatory Visit | Attending: Thoracic Surgery (Cardiothoracic Vascular Surgery)

## 2012-09-19 ENCOUNTER — Encounter (HOSPITAL_COMMUNITY): Payer: Self-pay | Admitting: Certified Registered Nurse Anesthetist

## 2012-09-19 ENCOUNTER — Ambulatory Visit: Payer: Medicare Other | Admitting: Emergency Medicine

## 2012-09-19 ENCOUNTER — Ambulatory Visit (HOSPITAL_COMMUNITY): Payer: Medicare Other | Admitting: Anesthesiology

## 2012-09-19 DIAGNOSIS — D381 Neoplasm of uncertain behavior of trachea, bronchus and lung: Secondary | ICD-10-CM

## 2012-09-19 DIAGNOSIS — I712 Thoracic aortic aneurysm, without rupture, unspecified: Secondary | ICD-10-CM | POA: Diagnosis present

## 2012-09-19 DIAGNOSIS — I10 Essential (primary) hypertension: Secondary | ICD-10-CM | POA: Diagnosis present

## 2012-09-19 DIAGNOSIS — D62 Acute posthemorrhagic anemia: Secondary | ICD-10-CM | POA: Diagnosis not present

## 2012-09-19 DIAGNOSIS — E7800 Pure hypercholesterolemia, unspecified: Secondary | ICD-10-CM | POA: Diagnosis present

## 2012-09-19 DIAGNOSIS — K7689 Other specified diseases of liver: Secondary | ICD-10-CM | POA: Diagnosis present

## 2012-09-19 DIAGNOSIS — Z833 Family history of diabetes mellitus: Secondary | ICD-10-CM

## 2012-09-19 DIAGNOSIS — K219 Gastro-esophageal reflux disease without esophagitis: Secondary | ICD-10-CM | POA: Diagnosis present

## 2012-09-19 DIAGNOSIS — C3410 Malignant neoplasm of upper lobe, unspecified bronchus or lung: Principal | ICD-10-CM | POA: Diagnosis present

## 2012-09-19 DIAGNOSIS — Z8249 Family history of ischemic heart disease and other diseases of the circulatory system: Secondary | ICD-10-CM

## 2012-09-19 DIAGNOSIS — Z800 Family history of malignant neoplasm of digestive organs: Secondary | ICD-10-CM

## 2012-09-19 DIAGNOSIS — E876 Hypokalemia: Secondary | ICD-10-CM | POA: Diagnosis not present

## 2012-09-19 DIAGNOSIS — E871 Hypo-osmolality and hyponatremia: Secondary | ICD-10-CM | POA: Diagnosis not present

## 2012-09-19 DIAGNOSIS — Z853 Personal history of malignant neoplasm of breast: Secondary | ICD-10-CM

## 2012-09-19 DIAGNOSIS — Z79899 Other long term (current) drug therapy: Secondary | ICD-10-CM

## 2012-09-19 DIAGNOSIS — K589 Irritable bowel syndrome without diarrhea: Secondary | ICD-10-CM | POA: Diagnosis present

## 2012-09-19 DIAGNOSIS — Z01812 Encounter for preprocedural laboratory examination: Secondary | ICD-10-CM

## 2012-09-19 DIAGNOSIS — R918 Other nonspecific abnormal finding of lung field: Secondary | ICD-10-CM

## 2012-09-19 HISTORY — PX: VIDEO ASSISTED THORACOSCOPY (VATS)/WEDGE RESECTION: SHX6174

## 2012-09-19 SURGERY — VIDEO ASSISTED THORACOSCOPY (VATS)/WEDGE RESECTION
Anesthesia: General | Site: Chest | Laterality: Left | Wound class: Clean Contaminated

## 2012-09-19 MED ORDER — LACTATED RINGERS IV SOLN
INTRAVENOUS | Status: DC | PRN
  Administered 2012-09-19: 07:00:00 via INTRAVENOUS

## 2012-09-19 MED ORDER — SENNOSIDES-DOCUSATE SODIUM 8.6-50 MG PO TABS
1.0000 | ORAL_TABLET | Freq: Every evening | ORAL | Status: DC | PRN
  Filled 2012-09-19: qty 1

## 2012-09-19 MED ORDER — FENTANYL 10 MCG/ML IV SOLN
INTRAVENOUS | Status: DC
  Administered 2012-09-19: 50 mL via INTRAVENOUS
  Filled 2012-09-19: qty 50

## 2012-09-19 MED ORDER — POTASSIUM CHLORIDE 10 MEQ/50ML IV SOLN
10.0000 meq | Freq: Every day | INTRAVENOUS | Status: DC | PRN
  Administered 2012-09-20 (×3): 10 meq via INTRAVENOUS
  Filled 2012-09-19: qty 150

## 2012-09-19 MED ORDER — OXYCODONE-ACETAMINOPHEN 5-325 MG PO TABS
1.0000 | ORAL_TABLET | ORAL | Status: DC | PRN
  Administered 2012-09-20 – 2012-09-21 (×3): 2 via ORAL
  Filled 2012-09-19 (×3): qty 2

## 2012-09-19 MED ORDER — CHLORHEXIDINE GLUCONATE CLOTH 2 % EX PADS
6.0000 | MEDICATED_PAD | Freq: Every day | CUTANEOUS | Status: DC
  Administered 2012-09-20: 6 via TOPICAL

## 2012-09-19 MED ORDER — MUPIROCIN 2 % EX OINT
1.0000 "application " | TOPICAL_OINTMENT | Freq: Two times a day (BID) | CUTANEOUS | Status: DC
  Administered 2012-09-19 – 2012-09-21 (×4): 1 via NASAL
  Filled 2012-09-19: qty 22

## 2012-09-19 MED ORDER — OXYCODONE-ACETAMINOPHEN 5-325 MG PO TABS: 1.0000 | ORAL_TABLET | ORAL | Status: DC | PRN

## 2012-09-19 MED ORDER — VITAMIN D3 50 MCG (2000 UT) PO TABS: 1.0000 | ORAL_TABLET | Freq: Every day | ORAL | Status: DC

## 2012-09-19 MED ORDER — SODIUM CHLORIDE 0.9 % IJ SOLN: 9.0000 mL | INTRAMUSCULAR | Status: DC | PRN

## 2012-09-19 MED ORDER — ACETAMINOPHEN 10 MG/ML IV SOLN
1000.0000 mg | Freq: Four times a day (QID) | INTRAVENOUS | Status: AC
  Administered 2012-09-19 – 2012-09-20 (×4): 1000 mg via INTRAVENOUS
  Filled 2012-09-19 (×5): qty 100

## 2012-09-19 MED ORDER — SUCCINYLCHOLINE CHLORIDE 20 MG/ML IJ SOLN
INTRAMUSCULAR | Status: DC | PRN
  Administered 2012-09-19: 100 mg via INTRAVENOUS

## 2012-09-19 MED ORDER — HEMOSTATIC AGENTS (NO CHARGE) OPTIME
TOPICAL | Status: DC | PRN
  Administered 2012-09-19: 1 via TOPICAL

## 2012-09-19 MED ORDER — DEXTROSE 5 % IV SOLN: 1.5000 g | Freq: Two times a day (BID) | INTRAVENOUS | Status: DC

## 2012-09-19 MED ORDER — GLYCOPYRROLATE 0.2 MG/ML IJ SOLN
INTRAMUSCULAR | Status: DC | PRN
  Administered 2012-09-19: 0.6 mg via INTRAVENOUS

## 2012-09-19 MED ORDER — HYDROMORPHONE HCL PF 1 MG/ML IJ SOLN
INTRAMUSCULAR | Status: AC
  Filled 2012-09-19: qty 2

## 2012-09-19 MED ORDER — ONDANSETRON HCL 4 MG/2ML IJ SOLN: 4.0000 mg | Freq: Four times a day (QID) | INTRAMUSCULAR | Status: DC | PRN

## 2012-09-19 MED ORDER — OXYCODONE HCL 5 MG PO TABS: 5.0000 mg | ORAL_TABLET | ORAL | Status: AC | PRN

## 2012-09-19 MED ORDER — OXYCODONE HCL 5 MG PO TABS: 5.0000 mg | ORAL_TABLET | Freq: Once | ORAL | Status: DC | PRN

## 2012-09-19 MED ORDER — BISACODYL 5 MG PO TBEC
10.0000 mg | DELAYED_RELEASE_TABLET | Freq: Every day | ORAL | Status: DC
  Administered 2012-09-20: 10 mg via ORAL
  Filled 2012-09-19: qty 2

## 2012-09-19 MED ORDER — 0.9 % SODIUM CHLORIDE (POUR BTL) OPTIME
TOPICAL | Status: DC | PRN
  Administered 2012-09-19: 3000 mL

## 2012-09-19 MED ORDER — FENTANYL 10 MCG/ML IV SOLN
INTRAVENOUS | Status: DC
  Administered 2012-09-19: 50 ug via INTRAVENOUS
  Administered 2012-09-19: 60 ug via INTRAVENOUS
  Administered 2012-09-19: 50 ug via INTRAVENOUS
  Administered 2012-09-20: 60 ug via INTRAVENOUS
  Administered 2012-09-20: 20 ug via INTRAVENOUS
  Administered 2012-09-20: 50 ug via INTRAVENOUS
  Filled 2012-09-19: qty 50

## 2012-09-19 MED ORDER — LEVALBUTEROL HCL 0.63 MG/3ML IN NEBU: 0.6300 mg | INHALATION_SOLUTION | Freq: Four times a day (QID) | RESPIRATORY_TRACT | Status: DC

## 2012-09-19 MED ORDER — HYDROMORPHONE HCL PF 1 MG/ML IJ SOLN
0.2500 mg | INTRAMUSCULAR | Status: DC | PRN
  Administered 2012-09-19 (×3): 0.5 mg via INTRAVENOUS

## 2012-09-19 MED ORDER — PROPOFOL 10 MG/ML IV BOLUS
INTRAVENOUS | Status: DC | PRN
  Administered 2012-09-19: 70 mg via INTRAVENOUS

## 2012-09-19 MED ORDER — LIDOCAINE HCL (CARDIAC) 20 MG/ML IV SOLN
INTRAVENOUS | Status: DC | PRN
  Administered 2012-09-19: 10 mg via INTRAVENOUS

## 2012-09-19 MED ORDER — DIPHENHYDRAMINE HCL 12.5 MG/5ML PO ELIX: 12.5000 mg | ORAL_SOLUTION | Freq: Four times a day (QID) | ORAL | Status: DC | PRN

## 2012-09-19 MED ORDER — DIPHENHYDRAMINE HCL 12.5 MG/5ML PO ELIX
12.5000 mg | ORAL_SOLUTION | Freq: Four times a day (QID) | ORAL | Status: DC | PRN
  Filled 2012-09-19: qty 5

## 2012-09-19 MED ORDER — B COMPLEX PO TABS: 0.5000 | ORAL_TABLET | Freq: Every day | ORAL | Status: DC

## 2012-09-19 MED ORDER — PANTOPRAZOLE SODIUM 40 MG PO TBEC: 80.0000 mg | DELAYED_RELEASE_TABLET | Freq: Every day | ORAL | Status: DC

## 2012-09-19 MED ORDER — BISACODYL 5 MG PO TBEC: 10.0000 mg | DELAYED_RELEASE_TABLET | Freq: Every day | ORAL | Status: DC

## 2012-09-19 MED ORDER — VERAPAMIL HCL ER 120 MG PO TBCR: 120.0000 mg | EXTENDED_RELEASE_TABLET | Freq: Every day | ORAL | Status: DC

## 2012-09-19 MED ORDER — NALOXONE HCL 0.4 MG/ML IJ SOLN: 0.4000 mg | INTRAMUSCULAR | Status: DC | PRN

## 2012-09-19 MED ORDER — POTASSIUM CHLORIDE 10 MEQ/50ML IV SOLN: 10.0000 meq | Freq: Every day | INTRAVENOUS | Status: DC | PRN

## 2012-09-19 MED ORDER — MIDAZOLAM HCL 5 MG/5ML IJ SOLN
INTRAMUSCULAR | Status: DC | PRN
  Administered 2012-09-19: 2 mg via INTRAVENOUS

## 2012-09-19 MED ORDER — SODIUM CHLORIDE 0.9 % IV SOLN
10.0000 mg | INTRAVENOUS | Status: DC | PRN
  Administered 2012-09-19: 40 ug/min via INTRAVENOUS

## 2012-09-19 MED ORDER — MIDAZOLAM HCL 2 MG/2ML IJ SOLN: 0.5000 mg | Freq: Once | INTRAMUSCULAR | Status: DC | PRN

## 2012-09-19 MED ORDER — LEVALBUTEROL HCL 0.63 MG/3ML IN NEBU
0.6300 mg | INHALATION_SOLUTION | Freq: Four times a day (QID) | RESPIRATORY_TRACT | Status: DC
  Administered 2012-09-19 – 2012-09-20 (×4): 0.63 mg via RESPIRATORY_TRACT
  Filled 2012-09-19 (×8): qty 3

## 2012-09-19 MED ORDER — SENNOSIDES-DOCUSATE SODIUM 8.6-50 MG PO TABS: 1.0000 | ORAL_TABLET | Freq: Every evening | ORAL | Status: DC | PRN

## 2012-09-19 MED ORDER — DIPHENHYDRAMINE HCL 50 MG/ML IJ SOLN: 12.5000 mg | Freq: Four times a day (QID) | INTRAMUSCULAR | Status: DC | PRN

## 2012-09-19 MED ORDER — OXYCODONE HCL 5 MG/5ML PO SOLN: 5.0000 mg | Freq: Once | ORAL | Status: DC | PRN

## 2012-09-19 MED ORDER — METOPROLOL SUCCINATE 12.5 MG HALF TABLET: 12.5000 mg | ORAL_TABLET | Freq: Every day | ORAL | Status: DC

## 2012-09-19 MED ORDER — ATORVASTATIN CALCIUM 20 MG PO TABS: 20.0000 mg | ORAL_TABLET | Freq: Every day | ORAL | Status: DC

## 2012-09-19 MED ORDER — PROMETHAZINE HCL 25 MG/ML IJ SOLN
INTRAMUSCULAR | Status: AC
  Filled 2012-09-19: qty 1

## 2012-09-19 MED ORDER — PROMETHAZINE HCL 25 MG/ML IJ SOLN
6.2500 mg | INTRAMUSCULAR | Status: DC | PRN
  Administered 2012-09-19: 6.25 mg via INTRAVENOUS

## 2012-09-19 MED ORDER — EPHEDRINE SULFATE 50 MG/ML IJ SOLN
INTRAMUSCULAR | Status: DC | PRN
  Administered 2012-09-19: 10 mg via INTRAVENOUS

## 2012-09-19 MED ORDER — ROCURONIUM BROMIDE 100 MG/10ML IV SOLN
INTRAVENOUS | Status: DC | PRN
  Administered 2012-09-19: 5 mg via INTRAVENOUS
  Administered 2012-09-19: 30 mg via INTRAVENOUS

## 2012-09-19 MED ORDER — FENTANYL CITRATE 0.05 MG/ML IJ SOLN
INTRAMUSCULAR | Status: DC | PRN
  Administered 2012-09-19: 50 ug via INTRAVENOUS
  Administered 2012-09-19: 200 ug via INTRAVENOUS

## 2012-09-19 MED ORDER — OXYCODONE HCL 5 MG PO TABS: 5.0000 mg | ORAL_TABLET | ORAL | Status: DC | PRN

## 2012-09-19 MED ORDER — DEXTROSE-NACL 5-0.45 % IV SOLN: INTRAVENOUS | Status: DC

## 2012-09-19 MED ORDER — MEPERIDINE HCL 25 MG/ML IJ SOLN: 6.2500 mg | INTRAMUSCULAR | Status: DC | PRN

## 2012-09-19 MED ORDER — DEXTROSE-NACL 5-0.45 % IV SOLN
INTRAVENOUS | Status: DC
  Administered 2012-09-19: 15:00:00 via INTRAVENOUS

## 2012-09-19 MED ORDER — NEOSTIGMINE METHYLSULFATE 1 MG/ML IJ SOLN
INTRAMUSCULAR | Status: DC | PRN
  Administered 2012-09-19: 3 mg via INTRAVENOUS

## 2012-09-19 MED ORDER — ACETAMINOPHEN 10 MG/ML IV SOLN: 1000.0000 mg | Freq: Four times a day (QID) | INTRAVENOUS | Status: DC

## 2012-09-19 MED ORDER — ONDANSETRON HCL 4 MG/2ML IJ SOLN
4.0000 mg | Freq: Four times a day (QID) | INTRAMUSCULAR | Status: DC | PRN
  Administered 2012-09-20: 4 mg via INTRAVENOUS
  Filled 2012-09-19: qty 2

## 2012-09-19 MED ORDER — ONDANSETRON HCL 4 MG/2ML IJ SOLN
INTRAMUSCULAR | Status: DC | PRN
  Administered 2012-09-19: 4 mg via INTRAVENOUS

## 2012-09-19 SURGICAL SUPPLY — 76 items
BENZOIN TINCTURE PRP APPL 2/3 (GAUZE/BANDAGES/DRESSINGS) ×3 IMPLANT
CANISTER SUCTION 2500CC (MISCELLANEOUS) ×3 IMPLANT
CATH KIT ON Q 5IN SLV (PAIN MANAGEMENT) IMPLANT
CATH THORACIC 28FR (CATHETERS) ×3 IMPLANT
CATH THORACIC 36FR (CATHETERS) IMPLANT
CATH THORACIC 36FR RT ANG (CATHETERS) IMPLANT
CLIP TI MEDIUM 6 (CLIP) ×3 IMPLANT
CLOTH BEACON ORANGE TIMEOUT ST (SAFETY) ×3 IMPLANT
CONT SPEC 4OZ CLIKSEAL STRL BL (MISCELLANEOUS) ×18 IMPLANT
COVER SURGICAL LIGHT HANDLE (MISCELLANEOUS) ×6 IMPLANT
DERMABOND ADHESIVE PROPEN (GAUZE/BANDAGES/DRESSINGS) ×1
DERMABOND ADVANCED .7 DNX6 (GAUZE/BANDAGES/DRESSINGS) ×2 IMPLANT
DRAIN CHANNEL 28F RND 3/8 FF (WOUND CARE) IMPLANT
DRAIN CHANNEL 32F RND 10.7 FF (WOUND CARE) IMPLANT
DRAPE LAPAROSCOPIC ABDOMINAL (DRAPES) ×3 IMPLANT
DRAPE WARM FLUID 44X44 (DRAPE) ×3 IMPLANT
ELECT REM PT RETURN 9FT ADLT (ELECTROSURGICAL) ×3
ELECTRODE REM PT RTRN 9FT ADLT (ELECTROSURGICAL) ×2 IMPLANT
GLOVE BIOGEL M STER SZ 6 (GLOVE) ×6 IMPLANT
GLOVE BIOGEL PI IND STRL 6.5 (GLOVE) ×4 IMPLANT
GLOVE BIOGEL PI IND STRL 7.5 (GLOVE) ×2 IMPLANT
GLOVE BIOGEL PI INDICATOR 6.5 (GLOVE) ×2
GLOVE BIOGEL PI INDICATOR 7.5 (GLOVE) ×1
GLOVE EUDERMIC 7 POWDERFREE (GLOVE) ×6 IMPLANT
GLOVE SURG SS PI 7.0 STRL IVOR (GLOVE) ×3 IMPLANT
GLOVE SURG SS PI 7.5 STRL IVOR (GLOVE) ×3 IMPLANT
GOWN PREVENTION PLUS XLARGE (GOWN DISPOSABLE) ×9 IMPLANT
GOWN STRL NON-REIN LRG LVL3 (GOWN DISPOSABLE) ×9 IMPLANT
HANDLE STAPLE ENDO GIA SHORT (STAPLE) ×1
HEMOSTAT SURGICEL 2X14 (HEMOSTASIS) ×3 IMPLANT
KIT BASIN OR (CUSTOM PROCEDURE TRAY) ×3 IMPLANT
KIT ROOM TURNOVER OR (KITS) ×3 IMPLANT
KIT SUCTION CATH 14FR (SUCTIONS) ×3 IMPLANT
NS IRRIG 1000ML POUR BTL (IV SOLUTION) ×6 IMPLANT
PACK CHEST (CUSTOM PROCEDURE TRAY) ×3 IMPLANT
PAD ARMBOARD 7.5X6 YLW CONV (MISCELLANEOUS) ×6 IMPLANT
POUCH ENDO CATCH II 15MM (MISCELLANEOUS) IMPLANT
POUCH SPECIMEN RETRIEVAL 10MM (ENDOMECHANICALS) IMPLANT
RELOAD EGIA 45 MED/THCK PURPLE (STAPLE) ×6 IMPLANT
RELOAD EGIA 60 MED/THCK PURPLE (STAPLE) ×12 IMPLANT
SEALANT PROGEL (MISCELLANEOUS) IMPLANT
SEALANT SURG COSEAL 4ML (VASCULAR PRODUCTS) IMPLANT
SEALANT SURG COSEAL 8ML (VASCULAR PRODUCTS) IMPLANT
SOLUTION ANTI FOG 6CC (MISCELLANEOUS) ×3 IMPLANT
SPECIMEN JAR MEDIUM (MISCELLANEOUS) ×3 IMPLANT
SPONGE GAUZE 4X4 12PLY (GAUZE/BANDAGES/DRESSINGS) ×3 IMPLANT
STAPLER ENDO GIA 12MM SHORT (STAPLE) ×2 IMPLANT
SUT PROLENE 4 0 RB 1 (SUTURE)
SUT PROLENE 4-0 RB1 .5 CRCL 36 (SUTURE) IMPLANT
SUT SILK  1 MH (SUTURE) ×1
SUT SILK 1 MH (SUTURE) ×2 IMPLANT
SUT SILK 2 0 SH (SUTURE) ×9 IMPLANT
SUT SILK 2 0SH CR/8 30 (SUTURE) IMPLANT
SUT SILK 3 0SH CR/8 30 (SUTURE) IMPLANT
SUT VIC AB 0 CTX 27 (SUTURE) ×3 IMPLANT
SUT VIC AB 1 CTX 27 (SUTURE) IMPLANT
SUT VIC AB 2-0 CT1 27 (SUTURE) ×1
SUT VIC AB 2-0 CT1 TAPERPNT 27 (SUTURE) ×2 IMPLANT
SUT VIC AB 2-0 CTX 36 (SUTURE) IMPLANT
SUT VIC AB 3-0 MH 27 (SUTURE) IMPLANT
SUT VIC AB 3-0 SH 27 (SUTURE)
SUT VIC AB 3-0 SH 27X BRD (SUTURE) IMPLANT
SUT VIC AB 3-0 X1 27 (SUTURE) ×6 IMPLANT
SUT VICRYL 0 UR6 27IN ABS (SUTURE) ×6 IMPLANT
SUT VICRYL 2 TP 1 (SUTURE) IMPLANT
SWAB COLLECTION DEVICE MRSA (MISCELLANEOUS) IMPLANT
SYSTEM SAHARA CHEST DRAIN ATS (WOUND CARE) ×3 IMPLANT
TAPE CLOTH SURG 4X10 WHT LF (GAUZE/BANDAGES/DRESSINGS) ×3 IMPLANT
TIP APPLICATOR SPRAY EXTEND 16 (VASCULAR PRODUCTS) IMPLANT
TOWEL OR 17X24 6PK STRL BLUE (TOWEL DISPOSABLE) ×3 IMPLANT
TOWEL OR 17X26 10 PK STRL BLUE (TOWEL DISPOSABLE) ×6 IMPLANT
TRAP SPECIMEN MUCOUS 40CC (MISCELLANEOUS) IMPLANT
TRAY FOLEY CATH 14FR (SET/KITS/TRAYS/PACK) ×3 IMPLANT
TUBE ANAEROBIC SPECIMEN COL (MISCELLANEOUS) IMPLANT
TUNNELER SHEATH ON-Q 11GX8 (MISCELLANEOUS) IMPLANT
WATER STERILE IRR 1000ML POUR (IV SOLUTION) ×6 IMPLANT

## 2012-09-19 NOTE — Progress Notes (Signed)
Report to Dr. Ivin Booty, re: EKG done at PAT appt. No change in plan or new orders furnished.

## 2012-09-19 NOTE — Preoperative (Signed)
Beta Blockers   Reason not to administer Beta Blockers:Not Applicable, Pt took Toprol at 0500 this am

## 2012-09-19 NOTE — Interval H&P Note (Signed)
History and Physical Interval Note:  09/19/2012 7:28 AM  Haley [name]  has presented today for surgery, with the diagnosis of LEFT UPPER LOBE MASS  The various methods of treatment have been discussed with the patient and family. After consideration of risks, benefits and other options for treatment, the patient has consented to  Procedure(s): VIDEO ASSISTED THORACOSCOPY (VATS)/WEDGE RESECTION (Left) LOBECTOMY (Left) as a surgical intervention .  The patient's history has been reviewed, patient examined, no change in status, stable for surgery.  I have reviewed the patient's chart and labs.  Questions were answered to the patient's satisfaction.     HENDRICKSON,STEVEN C

## 2012-09-19 NOTE — Anesthesia Postprocedure Evaluation (Signed)
  Anesthesia Post-op Note  Patient: Haley [name]  Procedure(s) Performed: Procedure(s): LEFT VIDEO ASSISTED THORACOSCOPY (VATS)/LEFT UPPER LOBE WEDGE RESECTION & NODE DISSECTION (Left)  Patient Location: PACU  Anesthesia Type:General  Level of Consciousness: sedated and patient cooperative, responsive to voice  Airway and Oxygen Therapy: Patient Spontanous Breathing and Patient connected to nasal cannula oxygen  Post-op Pain: mild  Post-op Assessment: Post-op Vital signs reviewed, Patient's Cardiovascular Status Stable, Respiratory Function Stable, Patent Airway, No signs of Nausea or vomiting and Pain level controlled  Post-op Vital Signs: Reviewed and stable  Complications: No apparent anesthesia complications

## 2012-09-19 NOTE — Brief Op Note (Addendum)
09/19/2012  9:39 AM  PATIENT:  Kandice Moos  79 y.o. female  PRE-OPERATIVE DIAGNOSIS:  LEFT UPPER LOBE MASS  POST-OPERATIVE DIAGNOSIS:  LEFT UPPER LOBE MASS  PROCEDURE:  Procedure(s):  LEFT VIDEO ASSISTED THORACOSCOPY  -LUL Wedge Resection -Lymph Node Resection  SURGEON:  Surgeon(s) and Role:    * Loreli Slot, MD - Primary  PHYSICIAN ASSISTANT: Lowella Dandy PA-C  ANESTHESIA:   general  EBL:  Total I/O In: 1000 [I.V.:1000] Out: 50 [Urine:50]  BLOOD ADMINISTERED:none  DRAINS: 28 Straight chest tube left pleural space   LOCAL MEDICATIONS USED:  NONE  SPECIMEN:  Source of Specimen:  Left upper Lobe of Left Lung, Lymph Nodes  DISPOSITION OF SPECIMEN:  PATHOLOGY  COUNTS:  YES  PLAN OF CARE: Admit to inpatient   PATIENT DISPOSITION:  PACU - hemodynamically stable.   Delay start of Pharmacological VTE agent (>24hrs) due to surgical blood loss or risk of bleeding: yes  Frozen- adenocarcinoma in situ

## 2012-09-19 NOTE — Anesthesia Procedure Notes (Signed)
Procedure Name: Intubation Date/Time: 09/19/2012 7:41 AM Performed by: Rogelia Boga Pre-anesthesia Checklist: Patient identified, Emergency Drugs available, Suction available, Patient being monitored and Timeout performed Patient Re-evaluated:Patient Re-evaluated prior to inductionOxygen Delivery Method: Circle system utilized Preoxygenation: Pre-oxygenation with 100% oxygen Intubation Type: IV induction, Rapid sequence and Cricoid Pressure applied Laryngoscope Size: Mac and 4 Grade View: Grade II Tube type: Oral Endobronchial tube: Double lumen EBT and EBT position confirmed by fiberoptic bronchoscope and 35 Fr Number of attempts: 1 Airway Equipment and Method: Stylet Placement Confirmation: ETT inserted through vocal cords under direct vision,  positive ETCO2 and breath sounds checked- equal and bilateral Secured at: 29 cm Tube secured with: Tape Dental Injury: Teeth and Oropharynx as per pre-operative assessment

## 2012-09-19 NOTE — H&P (View-Only) (Signed)
PCP is DAUB, STEVE A, MD Referring Provider is Byrum, Robert S, MD  Chief Complaint  Patient presents with  . Lung Lesion    eval and treat...CT CHEST 07/26/12...PET 08/29/12    HPI: 79-year-old woman presents with chief complaint of a lung mass.  Mrs. Ang is a 79-year-old woman, who is a lifelong nonsmoker. She has a history of breast cancer. She had an MRI done as part of her evaluation which showed an ascending aortic aneurysm. This led to a CT scan. The aneurysm was 4.5 cm on CT in 2011. She recently had a repeat CT which showed no change in the aneurysm. However it did show that a small groundglass opacity in the left upper lobe increased in size in the 18 month interval since the previous scan. A 7 mm area of nodularity in the right upper lobe was unchanged and likely represents scar. A PET CT showed the left upper lobe lesion to be hypermetabolic. She saw Dr. Byrum and now is referred for surgical consideration.  As noted she is a lifelong nonsmoker. She says that she does sometimes get short of breath with exertion particularly she walks rapidly up stairs. She does have a history of supraventricular tachycardia and palpitations. He's not had any history of coronary disease. She has had a cough, but no hemoptysis, weight loss, change in appetite, or change in energy level.   Past Medical History  Diagnosis Date  . Urinary tract infection, site not specified   . Symptomatic menopausal or female climacteric states   . Fatty liver   . Bronchitis, not specified as acute or chronic   . Hiatal hernia   . Atrophic gastritis without mention of hemorrhage   . Cardiac dysrhythmia, unspecified   . Pneumonia, organism unspecified   . Pure hypercholesterolemia   . Unspecified essential hypertension   . Cough   . Irritable bowel syndrome   . Other chest pain   . Malignant neoplasm of breast (female), unspecified site   . Esophageal reflux   . Chronic pancreatitis   . Flatulence,  eructation, and gas pain   . Gastric erosions     [place]  . Irritable bowel syndrome   . Family history of malignant neoplasm of gastrointestinal tract   . Sorethroat     since virus 2 months ago  . Hoarseness     since virus 2 months agp    Past Surgical History  Procedure Date  . Mastectomy, partial 2004    right   . Hemorroidectomy   . Cataract extraction     Bilateral  . Colonoscopy     Family History  Problem Relation Age of Onset  . Colon cancer Father 78  . Diabetes Paternal Aunt   . Heart disease Mother   . Heart disease Brother   . Cancer Brother     mass behind lung-died age 50  . Multiple myeloma Brother 68    died age 72    Social History History  Substance Use Topics  . Smoking status: Never Smoker   . Smokeless tobacco: Never Used  . Alcohol Use: No    Current Outpatient Prescriptions  Medication Sig Dispense Refill  . atorvastatin (LIPITOR) 10 MG tablet Take 20 mg by mouth daily.       . b complex vitamins tablet Take 1 tablet by mouth daily.        . Cholecalciferol (VITAMIN D3) 2000 UNITS TABS Take 1 tablet by mouth daily.        .   esomeprazole (NEXIUM) 40 MG capsule Take 1 capsule (40 mg total) by mouth daily before breakfast.  30 capsule  4  . metoprolol succinate (TOPROL-XL) 25 MG 24 hr tablet Take 1/2 tablet by mouth once a day as needed      . Probiotic Product (PROBIOTIC FORMULA) CAPS Take 1 capsule by mouth daily.        . verapamil (CALAN-SR) 120 MG CR tablet Take 120 mg by mouth at bedtime.          No Known Allergies  Review of Systems  Constitutional: Negative.   HENT: Positive for hearing loss.   Respiratory: Positive for cough and shortness of breath. Negative for wheezing.   Cardiovascular: Positive for palpitations. Negative for chest pain and leg swelling.       Atrial fib  Genitourinary: Positive for frequency.  Musculoskeletal: Positive for joint swelling.       Leg cramps  Hematological: Bruises/bleeds easily.        History of breast cancer  All other systems reviewed and are negative.    BP 149/81  Pulse 64  Resp 20  Ht 5' 6.5" (1.689 m)  Wt 150 lb (68.04 kg)  BMI 23.85 kg/m2  SpO2 98% Physical Exam  Vitals reviewed. Constitutional: She is oriented to person, place, and time. She appears well-developed and well-nourished. No distress.  HENT:  Head: Normocephalic and atraumatic.  Eyes: EOM are normal. Pupils are equal, round, and reactive to light.  Neck: Neck supple. No thyromegaly present.  Cardiovascular: Normal rate, regular rhythm and normal heart sounds.  Exam reveals no friction rub.   No murmur heard. Pulmonary/Chest: Effort normal and breath sounds normal. She has no wheezes. She has no rales.  Abdominal: Soft. There is no tenderness.  Musculoskeletal: Normal range of motion. She exhibits no edema.  Lymphadenopathy:    She has no cervical adenopathy.  Neurological: She is alert and oriented to person, place, and time. No cranial nerve deficit.  Skin: Skin is warm and dry.     Diagnostic Tests: CT chest 07/26/13 CT CHEST WITH CONTRAST  Technique: Multidetector CT imaging of the chest was performed  following the standard protocol during bolus administration of  intravenous contrast.  Contrast: 80mL OMNIPAQUE IOHEXOL 300 MG/ML SOLN  Comparison: Chest CT 02/25/2011.  Findings:  Mediastinum: Ascending thoracic aortic aneurysm is similar in size  measuring 4.5 cm. Heart size is normal. There is no significant  pericardial fluid, thickening or pericardial calcification. There  is atherosclerosis of the thoracic aorta, the great vessels of the  mediastinum and the coronary arteries, including calcified  atherosclerotic plaque in the right coronary arteries. No  pathologically enlarged mediastinal or hilar lymph nodes. Esophagus  is unremarkable in appearance.  Lungs/Pleura: Compared to prior examinations there is continued  interval growth of a the ground-glass attenuation  nodule in the  periphery of the left upper lobe which currently measures 1.9 x 1.4  cm (image 8 of series 3). This has no definite soft tissue  component on the soft tissue windows. Previously noted linear  nodular opacity in the right apex is slightly more prominent, but  only measures 7 mm. No other definite new or enlarging suspicious  appearing pulmonary nodules or masses are confidently identified.  No acute consolidative airspace disease. No pleural effusions.  There is a small amount of chronic scarring in the right middle  lobe and lingula.  Upper Abdomen: Unremarkable.  Musculoskeletal: There are no aggressive appearing lytic or blastic  lesions   noted in the visualized portions of the skeleton.  IMPRESSION:  1. Slight interval growth of a pure ground-glass attenuation  nodule in the left upper lobe which currently measures 1.9 x 1.4  cm. This remains suspicious for a slow-growing neoplasm such as a  low grade adenocarcinoma in situ, and consideration for surgical  resection may be warranted depending on the patient's clinical  status. At the very least, continued attention on follow-up  imaging is recommended with a repeat CT scan in 1 year.  2. Linear opacity in the right apex is very similar, measuring  only 7 mm on today's examination.  3. Mild aneurysmal dilatation of the ascending thoracic aorta (4.5  cm in diameter) is similar to prior.  PET/CT 08/29/12 NUCLEAR MEDICINE PET SKULL BASE TO THIGH  Fasting Blood Glucose: 84  Technique: 16.7 mCi F-18 FDG was injected intravenously. CT data  was obtained and used for attenuation correction and anatomic  localization only. (This was not acquired as a diagnostic CT  examination.) Additional exam technical data entered on  technologist worksheet.  Comparison: CT 07/26/2012  Findings:  Neck: No hypermetabolic lymph nodes or mass within the soft tissues  of the neck.  Chest: Similar appearance of the ascending thoracic aortic   aneurysm. No hypermetabolic mediastinal or hilar nodes. There is  a pure ground-glass nodule within the left upper lobe which  measures 1.4 cm, image 53. On the corresponding PET CT images  there is mild increased FDG uptake within SUV max equal to 2.3.  Stable linear density within the medial right lung apex measuring  approximately 7 mm. No significant FDG uptake is associated with  this density.  Abdomen/Pelvis: No abnormal hypermetabolic activity within the  liver, pancreas, adrenal glands, or spleen. No hypermetabolic  lymph nodes in the abdomen or pelvis.  Skeleton: No focal hypermetabolic activity to suggest skeletal  metastasis. Spondylosis noted within the cervical thoracic and  lumbar spine.  IMPRESSION:  1. Low level, malignant range FDG uptake is associated with the  pure ground-glass nodule within the left upper lobe. The SUV max  is equal to 2.5. Generally, PET CT is of limited value in  evaluating pure ground-glass nodules and is therefore not typically  recommended.However, these finding are worrisome for low grade  pulmonary adenocarcinoma and surgical consultation should be  considered.   Impression:  [name] is a 78-year-old woman who has an incidentally noted groundglass opacity in her left upper lobe. This has grown in size in the 18 months since her last CT scan. It was hypermetabolic by PET. The most likely scenario given her age is that this is an adenocarcinoma in situ or adenocarcinoma with lepidic growth. She is a nonsmoker but that type of tumor often occurs without any history of tobacco use. Given that the lesion is hypermetabolic on PET, I would not recommend continued radiographic followup. I think she would be best treated with surgical resection. If this does turn out to be an adenocarcinoma in situ type of lesion then a wide wedge resection should be curative although she would need to continue to be followed.  I discussed with the patient and her son  the nature of the proposed surgical procedure, including the need for general anesthesia, the incisions to be used, expected hospital stay, and the overall recovery. I discussed with them the indications, risks, benefits, and alternatives. They understand the risk include, but are not limited to, death, PE, DVT, MI, bleeding, possible need for transfusion, infection, prolonged   air leak, cardiac arrhythmias, or other unforeseeable complications.  She is uncertain as to whether she would want to proceed with surgery. She wishes to have some time to think this over. If she decides to proceed with surgery we can get it scheduled in a short period of time. I did ask her to let us know she opts not to have surgical resection as I would like to speak with her again before she totally ruled out the possibility. My impression is that she will proceed with surgery but just wants to think it over prior to scheduling the procedure.   Plan: Patient will call to let us know whether or not she was to proceed with surgical resection. 

## 2012-09-19 NOTE — Transfer of Care (Signed)
Immediate Anesthesia Transfer of Care Note  Patient: Haley [name]  Procedure(s) Performed: Procedure(s): LEFT VIDEO ASSISTED THORACOSCOPY (VATS)/LEFT UPPER LOBE WEDGE RESECTION & NODE DISSECTION (Left)  Patient Location: PACU  Anesthesia Type:General  Level of Consciousness: awake, alert  and patient cooperative  Airway & Oxygen Therapy: Patient Spontanous Breathing and Patient connected to nasal cannula oxygen  Post-op Assessment: Report given to PACU RN, Post -op Vital signs reviewed and stable and Patient moving all extremities X 4  Post vital signs: Reviewed and stable  Complications: No apparent anesthesia complications

## 2012-09-19 NOTE — Anesthesia Preprocedure Evaluation (Addendum)
Anesthesia Evaluation  Patient identified by MRN, date of birth, ID band Patient awake    Reviewed: Allergy & Precautions, H&P , NPO status , Patient's Chart, lab work & pertinent test results, reviewed documented beta blocker date and time   History of Anesthesia Complications (+) PONV  Airway Mallampati: II TM Distance: >3 FB Neck ROM: Full    Dental  (+) Dental Advisory Given   Pulmonary shortness of breath and with exertion, Current Smoker,  Lung mass L breath sounds clear to auscultation  Pulmonary exam normal       Cardiovascular hypertension, Pt. on medications and Pt. on home beta blockers + Peripheral Vascular Disease (4.5 cm ascending thoracic aortic aneurysm) - dysrhythmias Rhythm:Regular Rate:Normal     Neuro/Psych negative neurological ROS     GI/Hepatic Neg liver ROS, hiatal hernia, PUD, GERD-  Medicated and Poorly Controlled,  Endo/Other  negative endocrine ROS  Renal/GU negative Renal ROS     Musculoskeletal   Abdominal   Peds  Hematology   Anesthesia Other Findings Breast cancer  Reproductive/Obstetrics                          Anesthesia Physical Anesthesia Plan  ASA: III  Anesthesia Plan: General   Post-op Pain Management:    Induction: Intravenous  Airway Management Planned: Double Lumen EBT  Additional Equipment: Arterial line and CVP  Intra-op Plan:   Post-operative Plan: Possible Post-op intubation/ventilation  Informed Consent: I have reviewed the patients History and Physical, chart, labs and discussed the procedure including the risks, benefits and alternatives for the proposed anesthesia with the patient or authorized representative who has indicated his/her understanding and acceptance.   Dental advisory given  Plan Discussed with: CRNA, Surgeon and Anesthesiologist  Anesthesia Plan Comments: (Plan routine monitors, A-line, CVP and GETA with DLT)       Anesthesia Quick Evaluation

## 2012-09-20 ENCOUNTER — Inpatient Hospital Stay (HOSPITAL_COMMUNITY): Payer: Medicare Other

## 2012-09-20 ENCOUNTER — Encounter (HOSPITAL_COMMUNITY): Payer: Self-pay | Admitting: Thoracic Surgery (Cardiothoracic Vascular Surgery)

## 2012-09-20 LAB — BLOOD GAS, ARTERIAL
Drawn by: 347621
FIO2: 0.28 %
O2 Saturation: 97.3 %
Patient temperature: 98.6

## 2012-09-20 LAB — BASIC METABOLIC PANEL
Calcium: 7.8 mg/dL — ABNORMAL LOW (ref 8.4–10.5)
Creatinine, Ser: 0.53 mg/dL (ref 0.50–1.10)
GFR calc Af Amer: >90 mL/min (ref >90)

## 2012-09-20 LAB — CBC
Hemoglobin: 10.5 g/dL — ABNORMAL LOW (ref 12.0–15.0)
MCHC: 35 g/dL (ref 30.0–36.0)
Platelets: 222 10*3/uL (ref 150–400)
RDW: 13.3 % (ref 11.5–15.5)

## 2012-09-20 MED ORDER — METOPROLOL SUCCINATE 12.5 MG HALF TABLET
12.5000 mg | ORAL_TABLET | Freq: Every day | ORAL | Status: DC
  Administered 2012-09-21: 12.5 mg via ORAL
  Filled 2012-09-20 (×2): qty 1

## 2012-09-20 MED ORDER — ATORVASTATIN CALCIUM 20 MG PO TABS
20.0000 mg | ORAL_TABLET | Freq: Every day | ORAL | Status: DC
  Administered 2012-09-20: 20 mg via ORAL
  Filled 2012-09-20 (×2): qty 1

## 2012-09-20 MED ORDER — SODIUM CHLORIDE 0.9 % IV SOLN
INTRAVENOUS | Status: DC
  Administered 2012-09-20: 10:00:00 via INTRAVENOUS

## 2012-09-20 MED ORDER — OXYCODONE-ACETAMINOPHEN 5-325 MG PO TABS: 1.0000 | ORAL_TABLET | ORAL | Status: DC | PRN

## 2012-09-20 MED ORDER — VERAPAMIL HCL ER 120 MG PO TBCR
120.0000 mg | EXTENDED_RELEASE_TABLET | Freq: Every day | ORAL | Status: DC
  Administered 2012-09-20: 120 mg via ORAL
  Filled 2012-09-20 (×2): qty 1

## 2012-09-20 MED ORDER — VERAPAMIL HCL ER 120 MG PO TBCR: 120.0000 mg | EXTENDED_RELEASE_TABLET | Freq: Every day | ORAL | Status: DC

## 2012-09-20 MED ORDER — LEVALBUTEROL HCL 0.63 MG/3ML IN NEBU
0.6300 mg | INHALATION_SOLUTION | Freq: Four times a day (QID) | RESPIRATORY_TRACT | Status: DC | PRN
  Filled 2012-09-20: qty 3

## 2012-09-20 NOTE — Progress Notes (Signed)
1 Day Post-Op Procedure(s) (LRB): LEFT VIDEO ASSISTED THORACOSCOPY (VATS)/LEFT UPPER LOBE WEDGE RESECTION & NODE DISSECTION (Left) Subjective: Feels well Some incisional discomfort   Objective: Vital signs in last 24 hours: Temp:  [96.5 F (35.8 C)-98.6 F (37 C)] 97.6 F (36.4 C) (02/25 0800) Pulse Rate:  [51-66] 51 (02/25 0800) Cardiac Rhythm:  [-] Sinus bradycardia (02/25 0800) Resp:  [9-27] 19 (02/25 0800) BP: (128-180)/(57-96) 128/73 mmHg (02/25 0800) SpO2:  [22 %-100 %] 98 % (02/25 0813) Arterial Line BP: (128-185)/(57-100) 151/62 mmHg (02/25 0800) FiO2 (%):  [2 %] 2 % (02/24 1145) Weight:  [154 lb 15.7 oz (70.3 kg)] 154 lb 15.7 oz (70.3 kg) (02/24 1329)  Hemodynamic parameters for last 24 hours:    Intake/Output from previous day: 02/24 0701 - 02/25 0700 In: 4992 [P.O.:1320; I.V.:3372; IV Piggyback:300] Out: 1077 [Urine:875; Chest Tube:202] Intake/Output this shift: Total I/O In: 55 [I.V.:5; IV Piggyback:50] Out: 400 [Urine:250; Chest Tube:150]  General appearance: alert and no distress Neurologic: intact Heart: regular rate and rhythm Lungs: clear to auscultation bilaterally no air leak  Lab Results:  Recent Labs  09/20/12 0410  WBC 11.1*  HGB 10.5*  HCT 30.0*  PLT 222   BMET:  Recent Labs  09/20/12 0410  NA 128*  K 3.6  CL 94*  CO2 26  GLUCOSE 121*  BUN 8  CREATININE 0.53  CALCIUM 7.8*    PT/INR: No results found for this basename: LABPROT, INR,  in the last 72 hours ABG    Component Value Date/Time   PHART 7.382 09/20/2012 0344   HCO3 24.0 09/20/2012 0344   TCO2 25.3 09/20/2012 0344   ACIDBASEDEF 0.4 09/20/2012 0344   O2SAT 97.3 09/20/2012 0344   CBG (last 3)  No results found for this basename: GLUCAP,  in the last 72 hours  Assessment/Plan: S/P Procedure(s) (LRB): LEFT VIDEO ASSISTED THORACOSCOPY (VATS)/LEFT UPPER LOBE WEDGE RESECTION & NODE DISSECTION (Left) POD # 1 L VATS, wedge Overall doing well  CV- stable, dc a  line  RESP- lungs clear  CT to water seal, check CXR at 1100- if ok and no significant drainage will dc CT  RENAL- hyponatremia- dc 1/2 NS, will use NS at [facility]  Hypokalemia- supplement  Anemia secondary to ABL- mild, follow   LOS: 1 day    HENDRICKSON,STEVEN C 09/20/2012

## 2012-09-20 NOTE — Progress Notes (Signed)
Wasted 19cc of fentanyl with shannon love,rn.

## 2012-09-20 NOTE — Progress Notes (Signed)
Left Flank CT d/c'd per MD order per policy.  Patient tolerated well, will continue to monitor.

## 2012-09-20 NOTE — Progress Notes (Signed)
Utilization review completed.  

## 2012-09-20 NOTE — Discharge Summary (Signed)
Physician Discharge Summary  Patient ID: LEITH HEDLUND MRN: [id number] DOB/AGE: May 15, 1934 79 y.o.  Admit date: 09/19/2012 Discharge date: 09/21/2012  Admission Diagnoses: 1.Left upper lobe (ground glass opacity) mass 2.History of hypercholesterolemia 3.History of GERD 4.History of right breast cancer 5.History of IBS  Discharge Diagnoses:  1.Left upper lobe (ground glass opacity) mass 2.History of hypercholesterolemia 3.History of GERD 4.History of right breast cancer 5.History of IBS   Procedure (s):  Left video-assisted thoracoscopy, wedge resection of left  upper lobe nodule, mediastinal lymph node sampling by Dr. Dorris Fetch on 09/19/2012.   History of Presenting Illness: This is a 79 year old woman, who is a lifelong nonsmoker. She has a history of breast cancer. She had an MRI done as part of her evaluation which showed an ascending aortic aneurysm. This led to a CT scan. The aneurysm was 4.5 cm on CT in 2011. She recently had a repeat CT which showed no change in the aneurysm;however, it did show that a small groundglass opacity in the left upper lobe increased in size in the 18 month interval since the previous scan. A 7 mm area of nodularity in the right upper lobe was unchanged and likely represents scar. A PET CT showed the left upper lobe lesion to be hypermetabolic. She saw Dr. Delton Coombes and now is referred for surgical consideration.  As note,d she is a lifelong nonsmoker. She says that she does sometimes get short of breath with exertion, particularly she walks rapidly up stairs. She does have a history of supraventricular tachycardia and palpitations. She's not had any history of coronary disease. She has had a cough, but no hemoptysis, weight loss, change in appetite, or change in energy level. Dr. Dorris Fetch had a long discussion with the patient regarding the need for a left VATS and wedge resection, as this LUL mass is likely adenocarcinoma or adenocarcinoma in  situ.Potential risks, benefits, and complications of the surgery were discussed with the patient and she agreed to proceed.  Brief Hospital Course:  She remained afebrile and hemodynamically stable. Her a line and foley were removed early in her post operative course. Daily chest x rays were obtained and remained stable. She did not have an air leak from her chest tube. It was placed to water seal today. Her chest x ray remained stable and the chest tube was removed today. 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 Final pathology is currently pending.She has been tolerating a diet. She is ambulating fairly well on room air. Provided she remains afebrile, hemodynamically stable, and pending morning round evaluation, she will be surgically stable for discharge on 09/21/2012.    Latest Vital Signs: Blood pressure 150/73, pulse 54, temperature 97.5 F (36.4 C), temperature source Oral, resp. rate 16, height 5\' 6"  (1.676 m), weight 70.3 kg (154 lb 15.7 oz), SpO2 96.00%.  Physical Exam: General appearance: alert and no distress  Neurologic: intact  Heart: regular rate and rhythm  Lungs: clear to auscultation bilaterally  no air leak   Discharge Condition:Stable  Recent laboratory studies:  Lab Results  Component Value Date   WBC 11.1* 09/20/2012   HGB 10.5* 09/20/2012   HCT 30.0* 09/20/2012   MCV 88.5 09/20/2012   PLT 222 09/20/2012   Lab Results  Component Value Date   NA 128* 09/20/2012   K 3.6 09/20/2012   CL 94* 09/20/2012   CO2 26 09/20/2012   CREATININE 0.53 09/20/2012   GLUCOSE 121* 09/20/2012      Diagnostic  Studies:  Nm Pet Image Initial (pi) Skull Base To Thigh  08/29/2012  *RADIOLOGY REPORT*  Clinical Data: Initial treatment strategy for pulmonary nodule.  NUCLEAR MEDICINE PET SKULL BASE TO THIGH  Fasting Blood Glucose:  84  Technique:  16.7 mCi F-18 FDG was injected intravenously. CT data was obtained and used for attenuation correction and anatomic  localization only.  (This was not acquired as a diagnostic CT examination.) Additional exam technical data entered on technologist worksheet.  Comparison:  CT 07/26/2012  Findings:  Neck: No hypermetabolic lymph nodes or mass within the soft tissues of the neck.  Chest:  Similar appearance of the ascending thoracic aortic aneurysm.  No hypermetabolic mediastinal or hilar nodes.  There is a pure ground-glass nodule within the left upper lobe which measures 1.4 cm, image 53.  On the corresponding PET CT images there is mild increased FDG uptake within SUV max equal to 2.3. Stable linear density within the medial right lung apex measuring approximately 7 mm.  No significant FDG uptake is associated with this density.  Abdomen/Pelvis:  No abnormal hypermetabolic activity within the liver, pancreas, adrenal glands, or spleen.  No hypermetabolic lymph nodes in the abdomen or pelvis.  Skeleton:  No focal hypermetabolic activity to suggest skeletal metastasis.  Spondylosis noted within the cervical thoracic and lumbar spine.  IMPRESSION:  1.  Low level, malignant range FDG uptake is associated with the pure ground-glass nodule within the left upper lobe.  The SUV max is equal to 2.5.  Generally, PET CT is of limited value in evaluating pure ground-glass nodules and is therefore not typically recommended.However, these finding are worrisome for low grade pulmonary adenocarcinoma and surgical consultation should be considered.   Original Report Authenticated By: Signa Kell, M.D.     Dg Chest Port 1 View  09/20/2012  *RADIOLOGY REPORT*  Clinical Data: Status post thoracotomy/VATS  PORTABLE CHEST - 1 VIEW  Comparison: 09/19/2012  Findings: Postsurgical changes in the left upper hemithorax.  Mild patchy right basilar opacity, likely atelectasis.  Possible small right pleural effusion.  Left lung base is now clear.  Increased interstitial markings, likely reflecting mild interstitial edema.  Stable left apical chest tube.   No pneumothorax is seen.  Stable right IJ venous catheter terminating at the cavoatrial junction.  Cardiomegaly.  IMPRESSION: Stable left apical chest tube. No pneumothorax is seen.  Cardiomegaly with mild interstitial edema.  Suspected right basilar atelectasis with possible small right pleural effusion.   Original Report Authenticated By: Charline Bills, M.D.      Future Appointments Provider Department Dept Phone   10/11/2012 2:45 PM Loreli Slot, MD [facility] [phone]      Discharge Medications:   Medication List    TAKE these medications       atorvastatin 20 MG tablet  Commonly known as:  LIPITOR  Take 20 mg by mouth daily.     b complex vitamins tablet  Take 0.5 tablets by mouth daily.     esomeprazole 40 MG capsule  Commonly known as:  NEXIUM  Take 1 capsule (40 mg total) by mouth daily before breakfast.     metoprolol succinate 25 MG 24 hr tablet  Commonly known as:  TOPROL-XL  Take 12.5 mg by mouth daily as needed. FOR HYPERTENSION     oxyCODONE-acetaminophen 5-325 MG per tablet  Commonly known as:  PERCOCET/ROXICET  Take 1-2 tablets by mouth every 4 (four) hours as needed for pain.  PROBIOTIC FORMULA Caps  Take 1 capsule by mouth daily.     verapamil 120 MG CR tablet  Commonly known as:  CALAN-SR  Take 120 mg by mouth at bedtime.     Vitamin D3 2000 UNITS Tabs  Take 1 tablet by mouth daily.        Follow Up Appointments:     Follow-up Information   Follow up with HENDRICKSON,STEVEN C, MD. (PA.LAT CXR to be taken (at [facility] which is in the same building as Dr. Sunday Corn office) on 10/11/2012 at 1:45 pm;Appointment with Dr. Dorris Fetch is on 10/11/2012 at 2:45 pm)    Contact information:   [address] [place] [address] [phone]       Signed: Doree Fudge MPA-C 09/20/2012, 2:06 PM

## 2012-09-20 NOTE — Op Note (Signed)
Haley [name], Haley [name]               ACCOUNT NO.:  [id number]  MEDICAL RECORD NO.:  [id number]  LOCATION:  3304                         FACILITY:  [facility]  PHYSICIAN:  Salvatore Decent. Dorris Fetch, M.D.DATE OF BIRTH:  11/28/1933  DATE OF PROCEDURE:  09/19/2012 DATE OF DISCHARGE:                              OPERATIVE REPORT   PREOPERATIVE DIAGNOSIS:  Ground-glass opacity, left upper lobe.  POSTOPERATIVE DIAGNOSIS:  Ground-glass opacity, left upper lobe.  PROCEDURE:  Left video-assisted thoracoscopy, wedge resection of left upper lobe nodule, mediastinal lymph node sampling.  SURGEON:  Salvatore Decent. Dorris Fetch, MD  FIRST ASSISTANT:  Lowella Dandy, PA-C  ANESTHESIA:  General.  FINDINGS:  1.5 to 2 cm amorphous palpable mass in the lateral aspect of the left upper lobe.  Frozen section revealed adenocarcinoma in situ with a question of some atypical cells at the margin, additional margin taken and sent for permanent section.  Lymph nodes appeared normal grossly.  CLINICAL NOTE:  Haley [name] is a 79 year old woman who is a lifelong nonsmoker.  She recently had a CT which showed a small ground-glass opacity in the left upper lobe, had increased in size in the interval since her previous scan 18 months previously.  PET-CT showed the lesion to be hypermetabolic.  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  The indications, risks, benefits, and alternatives were discussed in detail with the patient. She understood and accepted the risks and agreed to proceed.  OPERATIVE NOTE:  Haley [name] was brought to the preoperative holding area on September 19, 2012, there Anesthesia placed central line and an arterial blood pressure monitoring line.  Intravenous antibiotics were administered.  She was taken to the  operating room, anesthetized, and intubated with a double-lumen endotracheal tube.  PAS hose were placed for DVT prophylaxis.  A Foley catheter was placed.  The patient was placed in a right lateral decubitus position and the left chest was prepped and draped in usual sterile fashion.  Single lung ventilation of the right lung was carried out and was tolerated well throughout the procedure.  An incision was made in approximately at the eighth intercostal space in the midaxillary line.  It was carried through the skin and subcutaneous tissue.  The chest was entered bluntly using a hemostat.  A port was inserted and the thoracoscope was placed into the chest.  There was no visible abnormality of the visceral or parietal pleura.  There was no pleural effusion.  A small utility incision was made anterolaterally. It was carried through the skin and subcutaneous tissue.  The serratus muscles were separated.  The intercostal muscles were divided.  The left upper lobe was grasped and palpated through the same incision, and there was a palpable abnormality in the lateral aspect of the left upper lobe, consistent with the finding on CT scan.  A wedge resection was performed of this lesion with sequential firings of an Endo-GIA stapler with purple cartridges.  The specimen was removed and palpated.  The mass was relatively small and had  somewhat of an amorphous feel to it.  There was no visceral pleural involvement.  The lesion was relatively close, approximately 1 cm grossly, to the nearest margin.  The specimen was sent for frozen section which returned adenocarcinoma in situ.  There was no definite invasive cancer, and there were some atypical cells, but no adenocarcinoma, at the margin.  Just to be safe, additional margin was taken again using an Endo-GIA stapler from the staple line that had been closest to the tumor.  The specimen was sent for permanent section.  The lung was retracted  laterally.  A level 10 node was identified and taken, as were level 5 and 4 L nodes.  These were all sent as separate specimens and all appeared grossly normal.  The chest was irrigated with warm saline.  Hemostasis was achieved.  The scope was withdrawn.  A 28- French chest tube was placed through the original port incision and directed to the apex.  The lung was reinflated.  The small utility incision was closed with 0 Vicryl fascial suture followed by 2-0 Vicryl subcutaneous suture, and a 3-0 Vicryl subcuticular suture.  All sponge, needle, and instrument counts were correct at the end of the procedure. The patient was taken from the operating room to the postanesthetic care unit in good condition.     Salvatore Decent Dorris Fetch, M.D.     SCH/MEDQ  D:  09/19/2012  T:  09/20/2012  Job:  [id number]

## 2012-09-21 ENCOUNTER — Inpatient Hospital Stay (HOSPITAL_COMMUNITY): Payer: Medicare Other

## 2012-09-21 LAB — CBC
MCHC: 35 g/dL (ref 30.0–36.0)
Platelets: 217 10*3/uL (ref 150–400)
RDW: 13.2 % (ref 11.5–15.5)
WBC: 8.7 10*3/uL (ref 4.0–10.5)

## 2012-09-21 LAB — COMPREHENSIVE METABOLIC PANEL
AST: 21 U/L (ref 0–37)
Albumin: 2.8 g/dL — ABNORMAL LOW (ref 3.5–5.2)
Chloride: 98 mEq/L (ref 96–112)
Creatinine, Ser: 0.68 mg/dL (ref 0.50–1.10)
Potassium: 3.8 mEq/L (ref 3.5–5.1)
Total Bilirubin: 0.7 mg/dL (ref 0.3–1.2)
Total Protein: 5.9 g/dL — ABNORMAL LOW (ref 6.0–8.3)

## 2012-09-21 MED ORDER — OXYCODONE-ACETAMINOPHEN 5-325 MG PO TABS: 1.0000 | ORAL_TABLET | ORAL | Status: DC | PRN

## 2012-09-21 MED ORDER — SODIUM CHLORIDE 0.9 % IJ SOLN
INTRAMUSCULAR | Status: AC
  Administered 2012-09-21: 10 mL
  Filled 2012-09-21: qty 10

## 2012-09-21 NOTE — Progress Notes (Signed)
2 Days Post-Op Procedure(s) (LRB): LEFT VIDEO ASSISTED THORACOSCOPY (VATS)/LEFT UPPER LOBE WEDGE RESECTION & NODE DISSECTION (Left) Subjective: No complaints Pain controlled with percocet  Objective: Vital signs in last 24 hours: Temp:  [97.4 F (36.3 C)-98.1 F (36.7 C)] 97.9 F (36.6 C) (02/26 0752) Pulse Rate:  [53-65] 53 (02/26 0318) Cardiac Rhythm:  [-] Normal sinus rhythm (02/26 0318) Resp:  [16-24] 23 (02/26 0318) BP: (123-156)/(69-96) 151/73 mmHg (02/26 0318) SpO2:  [91 %-97 %] 92 % (02/26 0318)  Hemodynamic parameters for last 24 hours:    Intake/Output from previous day: 02/25 0701 - 02/26 0700 In: 835 [P.O.:610; I.V.:125; IV Piggyback:100] Out: 1151 [Urine:1000; Emesis/NG output:1; Chest Tube:150] Intake/Output this shift:    General appearance: alert and no distress Lungs: clear to auscultation bilaterally Wound: clean and dry, some ecchymosis  Lab Results:  Recent Labs  09/20/12 0410 09/21/12 0409  WBC 11.1* 8.7  HGB 10.5* 10.7*  HCT 30.0* 30.6*  PLT 222 217   BMET:  Recent Labs  09/20/12 0410 09/21/12 0409  NA 128* 133*  K 3.6 3.8  CL 94* 98  CO2 26 29  GLUCOSE 121* 101*  BUN 8 8  CREATININE 0.53 0.68  CALCIUM 7.8* 8.4    PT/INR: No results found for this basename: LABPROT, INR,  in the last 72 hours ABG    Component Value Date/Time   PHART 7.382 09/20/2012 0344   HCO3 24.0 09/20/2012 0344   TCO2 25.3 09/20/2012 0344   ACIDBASEDEF 0.4 09/20/2012 0344   O2SAT 97.3 09/20/2012 0344   CBG (last 3)  No results found for this basename: GLUCAP,  in the last 72 hours  Assessment/Plan: S/P Procedure(s) (LRB): LEFT VIDEO ASSISTED THORACOSCOPY (VATS)/LEFT UPPER LOBE WEDGE RESECTION & NODE DISSECTION (Left) Plan for discharge: see discharge orders Doing well POD # 2 Left VATS, wedge Will dc home today Path T1a, N0 adenocarcinoma with lepidic spread- no additional treatment needed   LOS: 2 days    HENDRICKSON,STEVEN C 09/21/2012

## 2012-09-21 NOTE — Progress Notes (Signed)
Right DL IJ d/c'd per MD order, per policy.  Patient tolerated well, will continue to monitor.

## 2012-09-21 NOTE — Discharge Summary (Signed)
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

## 2012-09-21 NOTE — Progress Notes (Signed)
Patient d/c'd home per MD order. Patient and family given d/c instructions/prescriptions and all questions answered. Patient d/c'd via wheelchair with NS and family. 

## 2012-10-10 ENCOUNTER — Other Ambulatory Visit: Payer: Self-pay | Admitting: *Deleted

## 2012-10-11 ENCOUNTER — Ambulatory Visit
Admission: RE | Admit: 2012-10-11 | Discharge: 2012-10-11 | Disposition: A | Discharge status: Home / Self Care | Payer: Medicare Other | Source: Ambulatory Visit | Attending: Thoracic Surgery (Cardiothoracic Vascular Surgery) | Admitting: Thoracic Surgery (Cardiothoracic Vascular Surgery)

## 2012-10-11 ENCOUNTER — Other Ambulatory Visit: Payer: Self-pay

## 2012-10-11 ENCOUNTER — Ambulatory Visit (INDEPENDENT_AMBULATORY_CARE_PROVIDER_SITE_OTHER): Payer: Medicare Other | Admitting: Thoracic Surgery (Cardiothoracic Vascular Surgery)

## 2012-10-11 ENCOUNTER — Encounter: Payer: Self-pay | Admitting: Thoracic Surgery (Cardiothoracic Vascular Surgery)

## 2012-10-11 DIAGNOSIS — D381 Neoplasm of uncertain behavior of trachea, bronchus and lung: Secondary | ICD-10-CM

## 2012-10-11 DIAGNOSIS — C3410 Malignant neoplasm of upper lobe, unspecified bronchus or lung: Secondary | ICD-10-CM

## 2012-10-11 DIAGNOSIS — Z09 Encounter for follow-up examination after completed treatment for conditions other than malignant neoplasm: Secondary | ICD-10-CM

## 2012-10-11 NOTE — Progress Notes (Signed)
HPI:  Mrs. Tarman returns for a scheduled postoperative followup visit. She underwent a wedge resection of a groundglass opacity in her left upper lobe. This turned out to be a stage IA adenocarcinoma with a growth. Lymph nodes were negative. She did well postoperatively without any significant complications.  She says that she is having some discomfort but is no longer taking any pain medication. She will occasionally take an Aleve. She's not had any shortness of breath. She is anxious to resume her for like to these.  Past Medical History  Diagnosis Date  . Urinary tract infection, site not specified   . Symptomatic menopausal or female climacteric states   . Fatty liver   . Bronchitis, not specified as acute or chronic   . Hiatal hernia   . Atrophic gastritis without mention of hemorrhage   . Cardiac dysrhythmia, unspecified   . Pneumonia, organism unspecified   . Pure hypercholesterolemia   . Unspecified essential hypertension   . Cough   . Irritable bowel syndrome   . Other chest pain   . Esophageal reflux   . Chronic pancreatitis   . Flatulence, eructation, and gas pain   . Gastric erosions     [name]  . Irritable bowel syndrome   . Family history of malignant neoplasm of gastrointestinal tract   . Sorethroat     since virus 2 months ago  . Hoarseness     since virus 2 months agp  . Complication of anesthesia   . PONV (postoperative nausea and vomiting)     with hemorrhoid surgery years ago, no n/v with breast surgery  . Shortness of breath     with Exertion  . Malignant neoplasm of breast (female), unspecified site     Skin Cancer legs, face, shoulder, some basal some squamous  . Arthritis     knees and left hip      Current Outpatient Prescriptions  Medication Sig Dispense Refill  . atorvastatin (LIPITOR) 20 MG tablet Take 20 mg by mouth daily.      . Cholecalciferol (VITAMIN D3) 2000 UNITS TABS Take 1 tablet by mouth daily.        Marland Kitchen esomeprazole (NEXIUM) 40  MG capsule Take 1 capsule (40 mg total) by mouth daily before breakfast.  30 capsule  4  . metoprolol succinate (TOPROL-XL) 25 MG 24 hr tablet Take 12.5 mg by mouth daily as needed. FOR HYPERTENSION      . oxyCODONE-acetaminophen (PERCOCET/ROXICET) 5-325 MG per tablet Take 1-2 tablets by mouth every 4 (four) hours as needed for pain.  30 tablet  0  . Probiotic Product (PROBIOTIC FORMULA) CAPS Take 1 capsule by mouth daily.        . verapamil (CALAN-SR) 120 MG CR tablet Take 120 mg by mouth at bedtime.        Marland Kitchen b complex vitamins tablet Take 0.5 tablets by mouth daily.        No current facility-administered medications for this visit.    Physical Exam BP 115/78  Pulse 66  Resp 16  Ht 5\' 6"  (1.676 m)  Wt 145 lb (65.772 kg)  BMI 23.41 kg/m2  SpO52 59% 79 year old woman in no acute distress Incisions healing well Lungs clear with equal breath sounds bilaterally  Diagnostic Tests: Chest x-ray 10/11/2012 *RADIOLOGY REPORT*  Clinical Data: Status post thoracotomy.  CHEST - 2 VIEW  Comparison: September 21, 2012  Findings: There is a persistent small left effusion with mild  volume loss on the left.  There is no frank consolidation in either  lung. Heart size and pulmonary vascular normal. No apparent  pneumothorax.  IMPRESSION:  Small left effusion persist. Mild scarring on the left. No edema  or consolidation. No pneumothorax.   Impression: 79 year old woman who had a wedge resection of a groundglass opacity. It turned out to be an adenocarcinoma with a growth. This was a stage IA lesion with no evidence of spread to lymph nodes. In all likelihood she is cured, but we will need to follow her to rule out recurrence. She probably is more likely to develop a second primary than to have recurrence of this lesion. I will plan to see her back every 4 months for the first year, then every 6 months for a year or 2 after that and then annually.  She may begin driving. There are no restrictions  on her activities at this point in time, but I did advise her to build into new activities gradually. This particularly applies to activities that require lifting or use of her upper body.  Plan: Return in 3 months (4 month followup) with CT of chest.

## 2012-10-27 ENCOUNTER — Telehealth: Payer: Self-pay | Admitting: Gastroenterology

## 2012-10-27 NOTE — Telephone Encounter (Signed)
lmom for pt to call back. Hx of HH, Atrophic gastritis, IBS, Reflux, Chronic pancreatitis, Cameron gastric erosions. Hx dx of Lung cancer, Stage 1A with VATS on 09/19/12 with wedge resection of LLL for adenocarcinoma. She f/u with Dr Dorris Fetch on 10/11/12 and c/o hoarseness and sore throat x 2 months.

## 2012-10-27 NOTE — Telephone Encounter (Signed)
Pt reports pain in her throat after she eats when she goes to swallow; she has not associated the problem with drinking liquids. She states she also gets mouth ulcers in the same spot and as soon as one clears, she gets another one. She seems to get the ulcers when she has loose stools; she is on Nexium, Pt given an appt with Dr Jarold Motto in am.

## 2012-10-28 ENCOUNTER — Encounter: Payer: Self-pay | Admitting: Gastroenterology

## 2012-10-28 ENCOUNTER — Ambulatory Visit (INDEPENDENT_AMBULATORY_CARE_PROVIDER_SITE_OTHER): Payer: Medicare Other | Admitting: Gastroenterology

## 2012-10-28 VITALS — BP 112/68 | HR 56 | Ht 66.0 in | Wt 144.0 lb

## 2012-10-28 DIAGNOSIS — R0989 Other specified symptoms and signs involving the circulatory and respiratory systems: Secondary | ICD-10-CM

## 2012-10-28 DIAGNOSIS — F458 Other somatoform disorders: Secondary | ICD-10-CM

## 2012-10-28 DIAGNOSIS — K219 Gastro-esophageal reflux disease without esophagitis: Secondary | ICD-10-CM

## 2012-10-28 MED ORDER — SUCRALFATE 1 GM/10ML PO SUSP: ORAL | Status: DC

## 2012-10-28 MED ORDER — ESOMEPRAZOLE MAGNESIUM 40 MG PO CPDR: 40.0000 mg | DELAYED_RELEASE_CAPSULE | Freq: Two times a day (BID) | ORAL | Status: DC

## 2012-10-28 NOTE — Patient Instructions (Addendum)
Please take your Nexium one capsule twice daily, new prescription was sent to your pharmacy.  Carafate was sent to your pharmacy, please take as directed.  Please call Dr.Patterson's nurse Aram Beecham back with a progress report on how you are doing in a week. _________________________________________________________________________________________________________________________________________________________________________                                               We are excited to introduce MyChart, a new best-in-class service that provides you online access to important information in your electronic medical record. We want to make it easier for you to view your health information - all in one secure location - when and where you need it. We expect MyChart will enhance the quality of care and service we provide.  When you register for MyChart, you can:    View your test results.    Request appointments and receive appointment reminders via email.    Request medication renewals.    View your medical history, allergies, medications and immunizations.    Communicate with your physician's office through a password-protected site.    Conveniently print information such as your medication lists.  To find out if MyChart is right for you, please talk to a member of our clinical staff today. We will gladly answer your questions about this free health and wellness tool.  If you are age 53 or older and want a member of your family to have access to your record, you must provide written consent by completing a proxy form available at our office. Please speak to our clinical staff about guidelines regarding accounts for patients younger than age 14.  As you activate your MyChart account and need any technical assistance, please call the MyChart technical support line at [phone] [phone]) or email your question to [email or web address]. If you email your question(s), please  include your name, a return phone number and the best time to reach you.  If you have non-urgent health-related questions, you can send a message to our office through MyChart at [email or web address]. If you have a medical emergency, call 911.  Thank you for using MyChart as your new health and wellness resource!   MyChart licensed from [employer],  [id number]. Patents Pending.

## 2012-10-28 NOTE — Addendum Note (Signed)
Addended by: Ok Anis A on: 10/28/2012 09:39 AM   Modules accepted: Orders

## 2012-10-28 NOTE — Progress Notes (Signed)
This is a 79 year old Caucasian female who recently had left upper lobectomy performed because of the isolated adenocarcinoma of the lung.  She apparently does not have to have chemotherapy or radiation therapy.  She's had chronic, chronic acid reflux confirmed by endoscopy, and esophageal manometry with an associated esophageal motility disorder.  She now complains of a globus sensation in her throat, burning, questions, and coughing.  She denies dysphagia, hematemesis, anorexia, weight loss, hepatobiliary or lower gastrointestinal symptoms.  She is up-to-date on her endoscopy and colonoscopy.  Lab review shows no abnormalities.  Her main concern is of" esophageal cancer".  Current Medications, Allergies, Past Medical History, Past Surgical History, Family History and Social History were reviewed in [employer] record.  ROS: All systems were reviewed and are negative unless otherwise stated in the HPI.          Physical Exam: Blood pressure 112/68, pulse 56 and regular and weight 144 with a BMI of 23.25.  Examination oropharynx is entirely normal.  There is no lymphadenopathy, thyromegaly, or other masses in the neck.  Chest is clear and she is in a regular rhythm without murmurs gallops or rubs.  I cannot appreciate hepatosplenomegaly, abdominal masses or tenderness.  Bowel sounds are normal.  Rectal exam is unremarkable suffer some rectal stenosis.  Stool is normal form ,brown color and guaiac-negative.  Peripheral extremities are unremarkable and mental status is normal.    Assessment and Plan: Chronic acid reflux in an elderly patient who has concerns about possible esophageal cancer.  I've related to her that she had a negative endoscopy one year ago, and we reviewed acid reflux, its symptoms, and its management.  I have increased her Nexium to 40 mg twice a day with Carafate suspension 1 tablespoon 30 minutes after meals and at bedtime.  She is to call for progress report  in 2 weeks' time.  I reassured her that she does not have esophageal cancer or Barrett's mucosa.

## 2012-11-04 ENCOUNTER — Ambulatory Visit (INDEPENDENT_AMBULATORY_CARE_PROVIDER_SITE_OTHER): Payer: Medicare Other | Admitting: Internal Medicine

## 2012-11-04 ENCOUNTER — Telehealth: Payer: Self-pay | Admitting: Gastroenterology

## 2012-11-04 VITALS — BP 112/84 | HR 52 | Temp 98.5°F | Resp 16 | Ht 66.0 in | Wt 146.0 lb

## 2012-11-04 DIAGNOSIS — R350 Frequency of micturition: Secondary | ICD-10-CM

## 2012-11-04 DIAGNOSIS — R300 Dysuria: Secondary | ICD-10-CM

## 2012-11-04 DIAGNOSIS — N390 Urinary tract infection, site not specified: Secondary | ICD-10-CM

## 2012-11-04 LAB — POCT UA - MICROSCOPIC ONLY
Casts, Ur, LPF, POC: NEGATIVE
Crystals, Ur, HPF, POC: NEGATIVE
Yeast, UA: NEGATIVE

## 2012-11-04 LAB — POCT URINALYSIS DIPSTICK
Bilirubin, UA: NEGATIVE
Glucose, UA: NEGATIVE
Ketones, UA: NEGATIVE
Nitrite, UA: NEGATIVE
Protein, UA: NEGATIVE
Spec Grav, UA: 1.01
Urobilinogen, UA: 0.2
pH, UA: 6

## 2012-11-04 MED ORDER — CIPROFLOXACIN HCL 500 MG PO TABS: 500.0000 mg | ORAL_TABLET | Freq: Two times a day (BID) | ORAL | Status: DC

## 2012-11-04 MED ORDER — PHENAZOPYRIDINE HCL 200 MG PO TABS: 200.0000 mg | ORAL_TABLET | Freq: Three times a day (TID) | ORAL | Status: DC | PRN

## 2012-11-04 NOTE — Patient Instructions (Signed)
Urinary Tract Infection Urinary tract infections (UTIs) can develop anywhere along your urinary tract. Your urinary tract is your body's drainage system for removing wastes and extra water. Your urinary tract includes two kidneys, two ureters, a bladder, and a urethra. Your kidneys are a pair of bean-shaped organs. Each kidney is about the size of your fist. They are located below your ribs, one on each side of your spine. CAUSES Infections are caused by microbes, which are microscopic organisms, including fungi, viruses, and bacteria. These organisms are so small that they can only be seen through a microscope. Bacteria are the microbes that most commonly cause UTIs. SYMPTOMS  Symptoms of UTIs may vary by age and gender of the patient and by the location of the infection. Symptoms in young women typically include a frequent and intense urge to urinate and a painful, burning feeling in the bladder or urethra during urination. Older women and men are more likely to be tired, shaky, and weak and have muscle aches and abdominal pain. A fever may mean the infection is in your kidneys. Other symptoms of a kidney infection include pain in your back or sides below the ribs, nausea, and vomiting. DIAGNOSIS To diagnose a UTI, your caregiver will ask you about your symptoms. Your caregiver also will ask to provide a urine sample. The urine sample will be tested for bacteria and Trembley blood cells. Hagood blood cells are made by your body to help fight infection. TREATMENT  Typically, UTIs can be treated with medication. Because most UTIs are caused by a bacterial infection, they usually can be treated with the use of antibiotics. The choice of antibiotic and length of treatment depend on your symptoms and the type of bacteria causing your infection. HOME CARE INSTRUCTIONS  If you were prescribed antibiotics, take them exactly as your caregiver instructs you. Finish the medication even if you feel better after you  have only taken some of the medication.  Drink enough water and fluids to keep your urine clear or pale yellow.  Avoid caffeine, tea, and carbonated beverages. They tend to irritate your bladder.  Empty your bladder often. Avoid holding urine for long periods of time.  Empty your bladder before and after sexual intercourse.  After a bowel movement, women should cleanse from front to back. Use each tissue only once. SEEK MEDICAL CARE IF:   You have back pain.  You develop a fever.  Your symptoms do not begin to resolve within 3 days. SEEK IMMEDIATE MEDICAL CARE IF:   You have severe back pain or lower abdominal pain.  You develop chills.  You have nausea or vomiting.  You have continued burning or discomfort with urination. MAKE SURE YOU:   Understand these instructions.  Will watch your condition.  Will get help right away if you are not doing well or get worse. Document Released: 04/22/2005 Document Revised: 01/12/2012 Document Reviewed: 08/21/2011 Exit[employer] Patient Information 2013 [employer].  

## 2012-11-04 NOTE — Progress Notes (Signed)
  Subjective:    Patient ID: CONCHETTA LAMIA, female    DOB: [birth year], 79 y.o.   MRN: [id number]  HPI Has 1-2days of burning and frequency of urination. No fever chills ,nausea or fatigue.   Review of Systems     Objective:   Physical Exam Normal exam  Results for orders placed in visit on 11/04/12  POCT URINALYSIS DIPSTICK      Result Value Range   Color, UA yellow     Clarity, UA clear     Glucose, UA negative     Bilirubin, UA negative     Ketones, UA negative     Spec Grav, UA 1.010     Blood, UA small     pH, UA 6.0     Protein, UA negative     Urobilinogen, UA 0.2     Nitrite, UA negative     Leukocytes, UA small (1+)    POCT UA - MICROSCOPIC ONLY      Result Value Range   WBC, Ur, HPF, POC 4-10     RBC, urine, microscopic 2-5     Bacteria, U Microscopic 2+     Mucus, UA trace     Epithelial cells, urine per micros 0-1     Crystals, Ur, HPF, POC negative     Casts, Ur, LPF, POC negative     Yeast, UA negative          Assessment & Plan:  Cipro/Pyridium 200mg

## 2012-11-04 NOTE — Telephone Encounter (Signed)
Pt seen 10/28/12 for dysphagia and diarrhea; she was given carafate, BID Nexium. Today, she reports her reflux is better and she's only had one episode of loose stool this week. She's had one mouth ulcer that is going away and the burning in her throat is going away. Informed her the Nexium may take > 2 weeks to work, but she should notice improvement with the Nexium and Carafate. She also c/o possible UTI; urine frequency, burning, dysuria, but denies blood in her urine. I requested pt see her PCP for the urinary symptoms. Pt stated understanding.

## 2012-11-06 LAB — URINE CULTURE

## 2012-11-10 ENCOUNTER — Telehealth: Payer: Self-pay | Admitting: Gastroenterology

## 2012-11-10 MED ORDER — MAGIC MOUTHWASH: ORAL | Status: DC

## 2012-11-10 NOTE — Telephone Encounter (Signed)
Informed pt of Dr Norval Gable order for [facility]; she stated understanding.

## 2012-11-10 NOTE — Telephone Encounter (Signed)
Dukes Magic mouthwash ,swish and swallow 5 cc  3 times a day for one week

## 2012-11-10 NOTE — Telephone Encounter (Signed)
Clarified order.

## 2012-11-10 NOTE — Telephone Encounter (Signed)
Pt was to call for a 2 week update; she was seen on 10/28/12 and she was to increase Nexium to BID and add Carafate for worsening reflux, burning in her throat as well as a globus sensation. Today she reports she is better and is on an AB for a UTI. She no longer has the globus sensation, coughing is better, reflux is better, but a little hoarseness remains. Her main focus/problem remains burning in her mouth/throat where her tongue ends. She states the problem has improved slightly and is worse with fried or spicy foods. Any advice? Is diflucan an option; she said you checked her throat and she hasn't noticed any Bohne patches or anything? Thanks.

## 2012-12-28 ENCOUNTER — Other Ambulatory Visit: Payer: Self-pay

## 2012-12-28 DIAGNOSIS — C3490 Malignant neoplasm of unspecified part of unspecified bronchus or lung: Secondary | ICD-10-CM

## 2013-01-05 ENCOUNTER — Other Ambulatory Visit: Payer: Self-pay | Admitting: *Deleted

## 2013-01-10 ENCOUNTER — Encounter: Payer: Self-pay | Admitting: Thoracic Surgery (Cardiothoracic Vascular Surgery)

## 2013-01-10 ENCOUNTER — Ambulatory Visit (INDEPENDENT_AMBULATORY_CARE_PROVIDER_SITE_OTHER): Payer: Medicare Other | Admitting: Thoracic Surgery (Cardiothoracic Vascular Surgery)

## 2013-01-10 ENCOUNTER — Ambulatory Visit
Admission: RE | Admit: 2013-01-10 | Discharge: 2013-01-10 | Disposition: A | Discharge status: Home / Self Care | Payer: Medicare Other | Source: Ambulatory Visit | Attending: Thoracic Surgery (Cardiothoracic Vascular Surgery) | Admitting: Thoracic Surgery (Cardiothoracic Vascular Surgery)

## 2013-01-10 ENCOUNTER — Ambulatory Visit: Payer: Medicare Other | Admitting: Thoracic Surgery (Cardiothoracic Vascular Surgery)

## 2013-01-10 DIAGNOSIS — Z7189 Other specified counseling: Secondary | ICD-10-CM

## 2013-01-10 DIAGNOSIS — Z712 Person consulting for explanation of examination or test findings: Secondary | ICD-10-CM

## 2013-01-10 DIAGNOSIS — C3410 Malignant neoplasm of upper lobe, unspecified bronchus or lung: Secondary | ICD-10-CM

## 2013-01-10 DIAGNOSIS — C3490 Malignant neoplasm of unspecified part of unspecified bronchus or lung: Secondary | ICD-10-CM

## 2013-01-10 DIAGNOSIS — Z09 Encounter for follow-up examination after completed treatment for conditions other than malignant neoplasm: Secondary | ICD-10-CM

## 2013-01-10 NOTE — Progress Notes (Signed)
HPI:  Haley [name] returns for a scheduled followup appointment. She had a groundglass opacity in her left upper lobe. We did a video-assisted thoracoscopy and wedge resection on February 24. It turned out to be adenocarcinoma with lepidic spread. Her nodes were negative. The tumor was stage IA. She was last in the office 3 weeks postoperatively which time she was well.  In the interim since her last visit Mrs. Shidler has continued to do well. She's not having any pain related to her incision. Her breathing is been stable. Her weight has been stable. Overall she feels well.  Past Medical History  Diagnosis Date  . Urinary tract infection, site not specified   . Symptomatic menopausal or female climacteric states   . Fatty liver   . Bronchitis, not specified as acute or chronic   . Hiatal hernia   . Atrophic gastritis without mention of hemorrhage   . Cardiac dysrhythmia, unspecified   . Pneumonia, organism unspecified   . Pure hypercholesterolemia   . Unspecified essential hypertension   . Cough   . Irritable bowel syndrome   . Other chest pain   . Esophageal reflux   . Chronic pancreatitis   . Flatulence, eructation, and gas pain   . Gastric erosions     Haley [name]  . Irritable bowel syndrome   . Family history of malignant neoplasm of gastrointestinal tract   . Sorethroat     since virus 2 months ago  . Hoarseness     since virus 2 months agp  . Complication of anesthesia   . PONV (postoperative nausea and vomiting)     with hemorrhoid surgery years ago, no n/v with breast surgery  . Shortness of breath     with Exertion  . Malignant neoplasm of breast (female), unspecified site     Skin Cancer legs, face, shoulder, some basal some squamous  . Arthritis     knees and left hip      Current Outpatient Prescriptions  Medication Sig Dispense Refill  . atorvastatin (LIPITOR) 20 MG tablet Take 20 mg by mouth daily.      Marland Kitchen b complex vitamins tablet Take 0.5 tablets by mouth  daily.       . Cholecalciferol (VITAMIN D3) 2000 UNITS TABS Take 1 tablet by mouth daily.        Marland Kitchen esomeprazole (NEXIUM) 40 MG capsule Take 1 capsule (40 mg total) by mouth 2 (two) times daily.  60 capsule  6  . metoprolol succinate (TOPROL-XL) 25 MG 24 hr tablet Take 12.5 mg by mouth daily as needed. FOR HYPERTENSION      . Probiotic Product (PROBIOTIC FORMULA) CAPS Take 1 capsule by mouth daily.        . verapamil (CALAN-SR) 120 MG CR tablet Take 120 mg by mouth at bedtime.         No current facility-administered medications for this visit.    Physical Exam BP 123/74  Pulse 54  Resp 16  Ht 5\' 6"  (1.676 m)  Wt 144 lb (65.318 kg)  BMI 23.25 kg/m2  SpO2 98% Well-appearing 79 year old woman in no acute distress Neurologic alert and oriented x3 with no focal deficit No cervical or suprapubic or adenopathy Lungs clear with equal breath sounds bilaterally Incisions well healed Cardiac regular rate and rhythm normal S1-S2  Diagnostic Tests: CT. of chest 01/10/2013 *RADIOLOGY REPORT*  Clinical Data: Lung cancer, post left thoracotomy. Remote history  of right breast cancer.  CT CHEST WITHOUT CONTRAST  Technique:  Multidetector CT imaging of the chest was performed  following the standard protocol without IV contrast.  Comparison: PET CT 08/29/2012. Chest x-ray 10/11/2012.  Findings: Postoperative changes on the left. The previously seen  left upper lobe ground-glass nodular density no longer visualized.  Linear densities in the area compatible with scar. Stable nodular  density in the right upper lobe measuring 7-8 mm, likely scar.  Scarring noted in the lingula and right middle lobe, stable. No  new or enlarging pulmonary nodules.  Stable aneurysmal dilatation of the ascending thoracic aorta,  measuring 4.9 cm on image 35. Heart is mildly enlarged. No  mediastinal, hilar, or axillary adenopathy. Visualized thyroid and  chest wall soft tissues unremarkable. Imaging into the upper   abdomen shows no acute findings.  No acute or focal bony abnormality.  IMPRESSION:  Postoperative changes in the left upper lobe. No residual or  recurrent masses. No enlarging nodules. No pleural effusions.  Stable aneurysmal dilatation of the ascending thoracic aorta.  Original Report Authenticated By: Charlett Nose, M.D.  Impression: 79 year old woman now 4 months out from wedge resection for stage IA adenocarcinoma with lepidic spread. She has no evidence recurrent disease. I will plan to see her back in 4 months with a repeat CT of the chest. Again the plan will be q. 4 months for the first year then q. 6 months for 2 years after that and then annually be on that assuming that there is no issues that arise.  She does have a 4.6 cm ascending aortic aneurysm that has been stable over time. There is no indication for surgery at the present time . We will continue to follow that as well.  Plan: Return in 4 months with CT of chest

## 2013-01-11 ENCOUNTER — Other Ambulatory Visit: Payer: Self-pay

## 2013-01-11 DIAGNOSIS — Z1231 Encounter for screening mammogram for malignant neoplasm of breast: Secondary | ICD-10-CM

## 2013-01-11 DIAGNOSIS — Z9889 Other specified postprocedural states: Secondary | ICD-10-CM

## 2013-01-11 DIAGNOSIS — Z853 Personal history of malignant neoplasm of breast: Secondary | ICD-10-CM

## 2013-02-17 ENCOUNTER — Ambulatory Visit
Admission: RE | Admit: 2013-02-17 | Discharge: 2013-02-17 | Disposition: A | Discharge status: Home / Self Care | Payer: Medicare Other | Source: Ambulatory Visit

## 2013-02-17 DIAGNOSIS — Z853 Personal history of malignant neoplasm of breast: Secondary | ICD-10-CM

## 2013-02-17 DIAGNOSIS — Z9889 Other specified postprocedural states: Secondary | ICD-10-CM

## 2013-02-17 DIAGNOSIS — Z1231 Encounter for screening mammogram for malignant neoplasm of breast: Secondary | ICD-10-CM

## 2013-03-01 ENCOUNTER — Other Ambulatory Visit: Payer: Self-pay

## 2013-04-03 ENCOUNTER — Encounter: Payer: Self-pay | Admitting: Cardiovascular Disease

## 2013-04-03 ENCOUNTER — Ambulatory Visit (INDEPENDENT_AMBULATORY_CARE_PROVIDER_SITE_OTHER): Payer: Medicare Other | Admitting: Cardiovascular Disease

## 2013-04-03 VITALS — BP 128/72 | HR 58

## 2013-04-03 DIAGNOSIS — Z79899 Other long term (current) drug therapy: Secondary | ICD-10-CM

## 2013-04-03 DIAGNOSIS — I471 Supraventricular tachycardia: Secondary | ICD-10-CM | POA: Insufficient documentation

## 2013-04-03 DIAGNOSIS — E7800 Pure hypercholesterolemia, unspecified: Secondary | ICD-10-CM

## 2013-04-03 DIAGNOSIS — K219 Gastro-esophageal reflux disease without esophagitis: Secondary | ICD-10-CM

## 2013-04-03 DIAGNOSIS — I499 Cardiac arrhythmia, unspecified: Secondary | ICD-10-CM

## 2013-04-03 MED ORDER — METOPROLOL SUCCINATE ER 25 MG PO TB24: 12.5000 mg | ORAL_TABLET | Freq: Every day | ORAL | Status: DC | PRN

## 2013-04-03 NOTE — Patient Instructions (Signed)
Your physician recommends that you return for lab work fasting.  Your physician recommends that you schedule a follow-up appointment in:6 MONTHS. 

## 2013-04-03 NOTE — Progress Notes (Signed)
Patient ID: Haley [name], female   DOB: 05-23-1934, 79 y.o.   MRN: [id number]     HPI: Haley [name], is a 79 y.o. female who presents to the office today for one-year cardiology evaluation. Progress Ms. Effertz is now 79 years old. She has a history of superventricular tachycardia in the past has been treated with verapamil as well as beta blocker therapy. Recently, she has done well and now only takes a beta blocker on an as needed basis and admits to only rarely taking this. She does have remote history of breast cancer which in the past is followed by Dr. Donnie Coffin. She has a history of hiatal hernia with GERD, hyperlipidemia, and also evidence for descending thoracic aorta that is increased at 4.5 cm with [place] on a CT scan suggested fusiform aneurysmal dilatation. On the initial CT in 2011 she was found to have a right upper lung nodule. 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 Edwina Barth. She's had subsequent CT scan is for followup. She denies recent chest pain. She denies tachycardia palpitations. She denies PND or orthopnea. She denies bleeding.  Past Medical History  Diagnosis Date  . Urinary tract infection, site not specified   . Symptomatic menopausal or female climacteric states   . Fatty liver   . Bronchitis, not specified as acute or chronic   . Hiatal hernia   . Atrophic gastritis without mention of hemorrhage   . Cardiac dysrhythmia, unspecified   . Pneumonia, organism unspecified   . Pure hypercholesterolemia   . Unspecified essential hypertension   . Cough   . Irritable bowel syndrome   . Other chest pain   . Esophageal reflux   . Chronic pancreatitis   . Flatulence, eructation, and gas pain   . Gastric erosions     [name]  . Irritable bowel syndrome   . Family history of malignant neoplasm of gastrointestinal tract   . Sorethroat     since virus 2 months  ago  . Hoarseness     since virus 2 months agp  . Complication of anesthesia   . PONV (postoperative nausea and vomiting)     with hemorrhoid surgery years ago, no n/v with breast surgery  . Shortness of breath     with Exertion  . Malignant neoplasm of breast (female), unspecified site     Skin Cancer legs, face, shoulder, some basal some squamous  . Arthritis     knees and left hip    Past Surgical History  Procedure Laterality Date  . Mastectomy, partial  2004    right   . Hemorroidectomy    . Cataract extraction      Bilateral  . Colonoscopy    . Eye surgery    . Skin cancer excision    . Video assisted thoracoscopy (vats)/wedge resection Left 09/19/2012    Procedure: LEFT VIDEO ASSISTED THORACOSCOPY (VATS)/LEFT UPPER LOBE WEDGE RESECTION & NODE DISSECTION;  Surgeon: Loreli Slot, MD;  Location: [facility] OR;  Service: Thoracic;  Laterality: Left;    No Known Allergies  Current Outpatient Prescriptions  Medication Sig Dispense Refill  . atorvastatin (LIPITOR) 20 MG tablet Take 20 mg by mouth daily.      Marland Kitchen b complex vitamins tablet Take 1 tablet by mouth daily.       . Cholecalciferol (VITAMIN D3) 2000 UNITS TABS Take 1 tablet by  mouth daily.        Marland Kitchen esomeprazole (NEXIUM) 40 MG capsule Take 40 mg by mouth daily.      . metoprolol succinate (TOPROL-XL) 25 MG 24 hr tablet Take 12.5 mg by mouth daily as needed. FOR HYPERTENSION      . Probiotic Product (PROBIOTIC FORMULA) CAPS Take 1 capsule by mouth daily.        Marland Kitchen UNABLE TO FIND Take 1 capsule by mouth 2 (two) times daily. HydroEye      . verapamil (CALAN-SR) 120 MG CR tablet Take 120 mg by mouth at bedtime.         No current facility-administered medications for this visit.    History   Social History  . Marital Status: Married    Spouse Name: N/A    Number of Children: 2  . Years of Education: N/A   Occupational History  . Retired     Programmer, systems   Social History Main Topics  . Smoking status: Never Smoker    . Smokeless tobacco: Never Used  . Alcohol Use: No  . Drug Use: No  . Sexual Activity: Not on file   Other Topics Concern  . Not on file   Social History Narrative  . No narrative on file    Family History  Problem Relation Age of Onset  . Colon cancer Father 40  . Diabetes Paternal Aunt   . Heart disease Mother   . Heart disease Brother   . Cancer Brother     mass behind lung-died age 39  . Multiple myeloma Brother 68    died age 48    ROS is negative for fevers, chills or night sweats. She denies tachycardia palpitations. There is no wheezing. She denies chest pressure. She denies abdominal pain. She does have issues with GERD and takes Nexium twice a day. She denies edema. He denies paresthesias  Other system review is negative.  PE BP 128/72  Pulse 58  General: Alert, oriented, no distress.  Skin: normal turgor, no rashes HEENT: Normocephalic, atraumatic. Pupils round and reactive; sclera anicteric;no lid lag.  Nose without nasal septal hypertrophy Mouth/Parynx benign; Mallinpatti scale 2 Neck: No JVD, no carotid briuts Lungs: clear to ausculatation and percussion; no wheezing or rales Heart: RRR, s1 s2 normal 1/6 systolic murmur and 8 with a diastolic murmur in the aortic area Abdomen: soft, nontender; no hepatosplenomehaly, BS+; abdominal aorta nontender and not dilated by palpation. Pulses 2+ Extremities: no clubbing cyanosis or edema, Homan's sign negative  Neurologic: grossly nonfocal  ECG: Sinus rhythm 58 beats per minute. Intervals normal.  LABS:  BMET    Component Value Date/Time   NA 133* 09/21/2012 0409   K 3.8 09/21/2012 0409   CL 98 09/21/2012 0409   CO2 29 09/21/2012 0409   GLUCOSE 101* 09/21/2012 0409   BUN 8 09/21/2012 0409   CREATININE 0.68 09/21/2012 0409   CREATININE 0.77 07/05/2012 1524   CALCIUM 8.4 09/21/2012 0409   GFRNONAA 83* 09/21/2012 0409   GFRAA >90 09/21/2012 0409     Hepatic Function Panel     Component Value Date/Time   PROT  5.9* 09/21/2012 0409   ALBUMIN 2.8* 09/21/2012 0409   AST 21 09/21/2012 0409   ALT 11 09/21/2012 0409   ALKPHOS 59 09/21/2012 0409   BILITOT 0.7 09/21/2012 0409     CBC    Component Value Date/Time   WBC 8.7 09/21/2012 0409   WBC 5.6 07/05/2012 1526   WBC 4.9 04/23/2011  1403   RBC 3.46* 09/21/2012 0409   RBC 4.34 07/05/2012 1526   RBC 3.78 04/23/2011 1403   HGB 10.7* 09/21/2012 0409   HGB 12.6 07/05/2012 1526   HGB 11.9 04/23/2011 1403   HCT 30.6* 09/21/2012 0409   HCT 42.1 07/05/2012 1526   HCT 34.5* 04/23/2011 1403   PLT 217 09/21/2012 0409   PLT 273 04/23/2011 1403   MCV 88.4 09/21/2012 0409   MCV 97.0 07/05/2012 1526   MCV 91.2 04/23/2011 1403   MCH 30.9 09/21/2012 0409   MCH 29.0 07/05/2012 1526   MCH 31.5 04/23/2011 1403   MCHC 35.0 09/21/2012 0409   MCHC 29.9* 07/05/2012 1526   MCHC 34.5 04/23/2011 1403   RDW 13.2 09/21/2012 0409   RDW 13.6 04/23/2011 1403   LYMPHSABS 2.1 04/23/2011 1403   MONOABS 0.4 04/23/2011 1403   EOSABS 0.0 04/23/2011 1403   BASOSABS 0.0 04/23/2011 1403     BNP No results found for this basename: probnp    Lipid Panel     Component Value Date/Time   CHOL 159 07/05/2012 1524   TRIG 77 07/05/2012 1524   HDL 61 07/05/2012 1524   CHOLHDL 2.6 07/05/2012 1524   VLDL 15 07/05/2012 1524   LDLCALC 83 07/05/2012 1524     RADIOLOGY: No results found.    ASSESSMENT AND PLAN: Ms. Cheslock continues to do well from a cardiovascular standpoint. She has a history of paroxysmal supraventricular tachycardia which has been treated with verapamil and now only rarely takes when necessary metoprolol for breakthrough palpitations. She has a history of hyperlipidemia. There is a remote history of breast CA and she status post recent  left lung wedge resection for stage IA adenocarcinoma followed by Dr. Dorris Fetch. She continues to tolerate atorvastatin for hyperlipidemia. She does have issues with GERD and takes twice a day Nexium. I am suggesting laboratory be rechecked in  the fasting state consisting of a CBC, comprehensive metabolic panel, lipid panel, and TSH level. I will renew her medications. As long as she remains stable from a cardiac standpoint, I will see her in one year for followup evaluation.     Lennette Bihari, MD, [facility]  04/03/2013 9:36 AM

## 2013-04-05 ENCOUNTER — Other Ambulatory Visit: Payer: Self-pay | Admitting: *Deleted

## 2013-04-05 MED ORDER — ESOMEPRAZOLE MAGNESIUM 40 MG PO CPDR: 40.0000 mg | DELAYED_RELEASE_CAPSULE | Freq: Every day | ORAL | Status: DC

## 2013-04-14 ENCOUNTER — Other Ambulatory Visit: Payer: Self-pay | Admitting: *Deleted

## 2013-04-14 MED ORDER — ATORVASTATIN CALCIUM 20 MG PO TABS: 20.0000 mg | ORAL_TABLET | Freq: Every day | ORAL | Status: DC

## 2013-04-21 ENCOUNTER — Other Ambulatory Visit: Payer: Self-pay | Admitting: *Deleted

## 2013-05-08 ENCOUNTER — Other Ambulatory Visit: Payer: Self-pay | Admitting: *Deleted

## 2013-05-08 MED ORDER — VERAPAMIL HCL ER 120 MG PO TBCR: 120.0000 mg | EXTENDED_RELEASE_TABLET | Freq: Every day | ORAL | Status: DC

## 2013-05-08 NOTE — Telephone Encounter (Signed)
Rx was sent to pharmacy electronically. 

## 2013-05-16 ENCOUNTER — Ambulatory Visit (INDEPENDENT_AMBULATORY_CARE_PROVIDER_SITE_OTHER): Payer: Medicare Other | Admitting: Thoracic Surgery (Cardiothoracic Vascular Surgery)

## 2013-05-16 ENCOUNTER — Ambulatory Visit
Admission: RE | Admit: 2013-05-16 | Discharge: 2013-05-16 | Disposition: A | Discharge status: Home / Self Care | Payer: Medicare Other | Source: Ambulatory Visit | Attending: Thoracic Surgery (Cardiothoracic Vascular Surgery) | Admitting: Thoracic Surgery (Cardiothoracic Vascular Surgery)

## 2013-05-16 ENCOUNTER — Encounter: Payer: Self-pay | Admitting: Thoracic Surgery (Cardiothoracic Vascular Surgery)

## 2013-05-16 DIAGNOSIS — I712 Thoracic aortic aneurysm, without rupture: Secondary | ICD-10-CM

## 2013-05-16 DIAGNOSIS — C3410 Malignant neoplasm of upper lobe, unspecified bronchus or lung: Secondary | ICD-10-CM | POA: Insufficient documentation

## 2013-05-16 NOTE — Progress Notes (Signed)
HPI:  Haley [name] returns today for a scheduled followup visit.  She is a 79 year old nonsmoker who had a wedge resection in February of this year for turned out to be a stage IA adenocarcinoma with lepidic spread. She was last seen in the office in June at which time she was doing well.  In the interval since her last visit she has continued to do well. She's not had any significant medical problems. She denies weight loss. She has some reflux symptoms. She denies cough, hemoptysis, wheezing, shortness of breath. She does not had any new bone or joint pain or any unusual headaches or visual symptoms.  Past Medical History  Diagnosis Date  . Urinary tract infection, site not specified   . Symptomatic menopausal or female climacteric states   . Fatty liver   . Bronchitis, not specified as acute or chronic   . Hiatal hernia   . Atrophic gastritis without mention of hemorrhage   . Cardiac dysrhythmia, unspecified   . Pneumonia, organism unspecified   . Pure hypercholesterolemia   . Unspecified essential hypertension   . Cough   . Irritable bowel syndrome   . Other chest pain   . Esophageal reflux   . Chronic pancreatitis   . Flatulence, eructation, and gas pain   . Gastric erosions     Haley [name]  . Irritable bowel syndrome   . Family history of malignant neoplasm of gastrointestinal tract   . Sorethroat     since virus 2 months ago  . Hoarseness     since virus 2 months agp  . Complication of anesthesia   . PONV (postoperative nausea and vomiting)     with hemorrhoid surgery years ago, no n/v with breast surgery  . Shortness of breath     with Exertion  . Malignant neoplasm of breast (female), unspecified site     Skin Cancer legs, face, shoulder, some basal some squamous  . Arthritis     knees and left hip   Past Surgical History  Procedure Laterality Date  . Mastectomy, partial  2004    right   . Hemorroidectomy    . Cataract extraction      Bilateral  . Colonoscopy     . Eye surgery    . Skin cancer excision    . Video assisted thoracoscopy (vats)/wedge resection Left 09/19/2012    Procedure: LEFT VIDEO ASSISTED THORACOSCOPY (VATS)/LEFT UPPER LOBE WEDGE RESECTION & NODE DISSECTION;  Surgeon: Loreli Slot, MD;  Location: [facility] OR;  Service: Thoracic;  Laterality: Left;      Current Outpatient Prescriptions  Medication Sig Dispense Refill  . atorvastatin (LIPITOR) 20 MG tablet Take 1 tablet (20 mg total) by mouth daily.  90 tablet  3  . b complex vitamins tablet Take 1 tablet by mouth daily.       . Cholecalciferol (VITAMIN D3) 2000 UNITS TABS Take 1 tablet by mouth daily.        Marland Kitchen esomeprazole (NEXIUM) 40 MG capsule Take 1 capsule (40 mg total) by mouth daily.  90 capsule  11  . metoprolol succinate (TOPROL-XL) 25 MG 24 hr tablet Take 0.5 tablets (12.5 mg total) by mouth daily as needed. FOR HYPERTENSION  30 tablet  3  . Probiotic Product (PROBIOTIC FORMULA) CAPS Take 1 capsule by mouth daily.        Marland Kitchen UNABLE TO FIND Take 1 capsule by mouth 2 (two) times daily. HydroEye      . verapamil (CALAN-SR) 120  MG CR tablet Take 1 tablet (120 mg total) by mouth at bedtime.  30 tablet  11   No current facility-administered medications for this visit.    Physical Exam BP 133/80  Pulse 57  Resp 20  Ht 5\' 6"  (1.676 m)  Wt 144 lb (65.318 kg)  BMI 23.25 kg/m2  SpO22 17% 79 year old woman in no acute distress Well-developed well-nourished Neurologic intact No cervical or subclavicular adenopathy Cardiac regular rate and rhythm normal S1 and S2 Lungs clear with equal breath sounds bilaterally Incisions well healed No peripheral edema  Diagnostic Tests: CT chest 05/16/2013 EXAM:  CT CHEST WITHOUT CONTRAST  TECHNIQUE:  Multidetector CT imaging of the chest was performed following the  standard protocol without IV contrast.  COMPARISON: 01/10/2013  FINDINGS:  No pleural effusion identified. Postoperative change from Lung  cancer resection from the  left upper lobe noted. No specific  features identified to suggest local recurrence of disease. Stable  sub solid density within the right apex, image 12/series 4. No new  or enlarging pulmonary parenchymal nodules are mass is noted.  The trachea is midline and appears patent. Heart size appears  normal. There is no pericardial effusion. There is no mediastinal or  hilar adenopathy. No axillary or supraclavicular adenopathy.  Limited imaging through the upper abdomen is negative for lytic or  sclerotic stress set limited imaging through the upper abdomen is  unremarkable. No acute findings identified.  Review of the bone windows demonstrates no aggressive lytic or  sclerotic lesions.  IMPRESSION:  1. Stable CT of the chest. No specific features identified to  suggest residual or recurrent tumor in the left lung. No evidence  for metastatic disease.  2. Stable sub solid density within the right upper lobe.  Electronically Signed  By: Signa Kell M.D.  On: 05/16/2013 09:23   Impression: 79 year old woman who is now 8 months out from resection of a stage I adenocarcinoma with lepidic spread. She has no evidence recurrent disease.  She does have a small groundglass opacity in the right upper lobe which is been stable.  She also has a 4.7 cm descending aortic aneurysm which is stable as well.  Plan: I will plan to see her back in 4 months with another CT of the chest at that time. After that we will go to every 6 months.

## 2013-05-18 ENCOUNTER — Ambulatory Visit (INDEPENDENT_AMBULATORY_CARE_PROVIDER_SITE_OTHER): Payer: Medicare Other | Admitting: Family Medicine

## 2013-05-18 VITALS — BP 124/68 | HR 63 | Temp 98.4°F | Resp 16 | Wt 147.8 lb

## 2013-05-18 DIAGNOSIS — N390 Urinary tract infection, site not specified: Secondary | ICD-10-CM

## 2013-05-18 DIAGNOSIS — R350 Frequency of micturition: Secondary | ICD-10-CM

## 2013-05-18 LAB — POCT URINALYSIS DIPSTICK
Bilirubin, UA: NEGATIVE
Glucose, UA: NEGATIVE
Ketones, UA: NEGATIVE
Nitrite, UA: NEGATIVE
Spec Grav, UA: <=1.005

## 2013-05-18 LAB — POCT UA - MICROSCOPIC ONLY
Crystals, Ur, HPF, POC: NEGATIVE
Mucus, UA: NEGATIVE
RBC, urine, microscopic: NEGATIVE

## 2013-05-18 MED ORDER — NITROFURANTOIN MONOHYD MACRO 100 MG PO CAPS: 100.0000 mg | ORAL_CAPSULE | Freq: Two times a day (BID) | ORAL | Status: DC

## 2013-05-18 NOTE — Patient Instructions (Signed)
Urinary Tract Infection  Urinary tract infections (UTIs) can develop anywhere along your urinary tract. Your urinary tract is your body's drainage system for removing wastes and extra water. Your urinary tract includes two kidneys, two ureters, a bladder, and a urethra. Your kidneys are a pair of bean-shaped organs. Each kidney is about the size of your fist. They are located below your ribs, one on each side of your spine.  CAUSES  Infections are caused by microbes, which are microscopic organisms, including fungi, viruses, and bacteria. These organisms are so small that they can only be seen through a microscope. Bacteria are the microbes that most commonly cause UTIs.  SYMPTOMS   Symptoms of UTIs may vary by age and gender of the patient and by the location of the infection. Symptoms in young women typically include a frequent and intense urge to urinate and a painful, burning feeling in the bladder or urethra during urination. Older women and men are more likely to be tired, shaky, and weak and have muscle aches and abdominal pain. A fever may mean the infection is in your kidneys. Other symptoms of a kidney infection include pain in your back or sides below the ribs, nausea, and vomiting.  DIAGNOSIS  To diagnose a UTI, your caregiver will ask you about your symptoms. Your caregiver also will ask to provide a urine sample. The urine sample will be tested for bacteria and Ange blood cells. [place] blood cells are made by your body to help fight infection.  TREATMENT   Typically, UTIs can be treated with medication. Because most UTIs are caused by a bacterial infection, they usually can be treated with the use of antibiotics. The choice of antibiotic and length of treatment depend on your symptoms and the type of bacteria causing your infection.  HOME CARE INSTRUCTIONS   If you were prescribed antibiotics, take them exactly as your caregiver instructs you. Finish the medication even if you feel better after you  have only taken some of the medication.   Drink enough water and fluids to keep your urine clear or pale yellow.   Avoid caffeine, tea, and carbonated beverages. They tend to irritate your bladder.   Empty your bladder often. Avoid holding urine for long periods of time.   Empty your bladder before and after sexual intercourse.   After a bowel movement, women should cleanse from front to back. Use each tissue only once.  SEEK MEDICAL CARE IF:    You have back pain.   You develop a fever.   Your symptoms do not begin to resolve within 3 days.  SEEK IMMEDIATE MEDICAL CARE IF:    You have severe back pain or lower abdominal pain.   You develop chills.   You have nausea or vomiting.   You have continued burning or discomfort with urination.  MAKE SURE YOU:    Understand these instructions.   Will watch your condition.   Will get help right away if you are not doing well or get worse.  Document Released: 04/22/2005 Document Revised: 01/12/2012 Document Reviewed: 08/21/2011  [employer] Patient Information 2014 [employer]

## 2013-05-20 NOTE — Progress Notes (Signed)
Subjective:    Patient ID: Haley [name], female    DOB: 07-20-1934, 79 y.o.   MRN: [id number]  This chart was scribed for Norberto Sorenson, MD Danella Maiers, ED Scribe. This patient was seen in room 1 and the patient's care was started at 3:16 PM.  Chief Complaint  Patient presents with  . Urinary Frequency   Urinary Frequency  Associated symptoms include chills, frequency and nausea. Pertinent negatives include no vomiting.   HPI Comments: Haley [name] is a 79 y.o. female who presents to the [facility] complaining of a suspected bladder infection with associated pain and burning with urination and frequent urination. She denies fever but states she feels cold sometimes. She reports her stomach is a little queazy. She denies abdominal pain, back pain, vomiting. She states she has urinary incontinence at baseline but it has been worse recently. She states she had a UTI in April of this year and her symptoms feel the same.  Past Medical History  Diagnosis Date  . Urinary tract infection, site not specified   . Symptomatic menopausal or female climacteric states   . Fatty liver   . Bronchitis, not specified as acute or chronic   . Hiatal hernia   . Atrophic gastritis without mention of hemorrhage   . Cardiac dysrhythmia, unspecified   . Pneumonia, organism unspecified   . Pure hypercholesterolemia   . Unspecified essential hypertension   . Cough   . Irritable bowel syndrome   . Other chest pain   . Esophageal reflux   . Chronic pancreatitis   . Flatulence, eructation, and gas pain   . Gastric erosions     [name]  . Irritable bowel syndrome   . Family history of malignant neoplasm of gastrointestinal tract   . Sorethroat     since virus 2 months ago  . Hoarseness     since virus 2 months agp  . Complication of anesthesia   . PONV (postoperative nausea and vomiting)     with hemorrhoid surgery years ago, no n/v with breast surgery  . Shortness of breath      with Exertion  . Malignant neoplasm of breast (female), unspecified site     Skin Cancer legs, face, shoulder, some basal some squamous  . Arthritis     knees and left hip   Current Outpatient Prescriptions on File Prior to Visit  Medication Sig Dispense Refill  . atorvastatin (LIPITOR) 20 MG tablet Take 1 tablet (20 mg total) by mouth daily.  90 tablet  3  . b complex vitamins tablet Take 1 tablet by mouth daily.       . Cholecalciferol (VITAMIN D3) 2000 UNITS TABS Take 1 tablet by mouth daily.        Marland Kitchen esomeprazole (NEXIUM) 40 MG capsule Take 1 capsule (40 mg total) by mouth daily.  90 capsule  11  . metoprolol succinate (TOPROL-XL) 25 MG 24 hr tablet Take 0.5 tablets (12.5 mg total) by mouth daily as needed. FOR HYPERTENSION  30 tablet  3  . Probiotic Product (PROBIOTIC FORMULA) CAPS Take 1 capsule by mouth daily.        Marland Kitchen UNABLE TO FIND Take 1 capsule by mouth 2 (two) times daily. HydroEye      . verapamil (CALAN-SR) 120 MG CR tablet Take 1 tablet (120 mg total) by mouth at bedtime.  30 tablet  11   No current facility-administered medications on file prior to visit.   No  Known Allergies   Review of Systems  Constitutional: Positive for chills. Negative for fever.  HENT: Negative for rhinorrhea and sore throat.   Eyes: Negative for visual disturbance.  Respiratory: Negative for cough and shortness of breath.   Cardiovascular: Negative for chest pain and leg swelling.  Gastrointestinal: Positive for nausea. Negative for vomiting, abdominal pain and diarrhea.  Genitourinary: Positive for dysuria and frequency.  Musculoskeletal: Negative for back pain and neck pain.  Skin: Negative for rash.  Neurological: Negative for headaches.  Hematological: Does not bruise/bleed easily.  Psychiatric/Behavioral: Negative for confusion.  All other systems reviewed and are negative.      BP 124/68  Pulse 63  Temp(Src) 98.4 F (36.9 C) (Oral)  Resp 16  Wt 147 lb 12.8 oz (67.042 kg)   BMI 23.87 kg/m2  SpO2 98% Objective:   Physical Exam  Nursing note and vitals reviewed. Constitutional: She is oriented to person, place, and time. She appears well-developed and well-nourished. No distress.  HENT:  Head: Normocephalic and atraumatic.  Eyes: EOM are normal.  Neck: Neck supple. No tracheal deviation present.  Cardiovascular: Normal rate, regular rhythm, normal heart sounds and intact distal pulses.   Pulmonary/Chest: Effort normal and breath sounds normal. No respiratory distress.  Abdominal: Soft. Bowel sounds are normal. She exhibits no distension and no mass. There is no hepatosplenomegaly. There is tenderness in the suprapubic area. There is no rigidity, no rebound, no guarding and no CVA tenderness.  Musculoskeletal: Normal range of motion.  Neurological: She is alert and oriented to person, place, and time.  Skin: Skin is warm and dry. She is not diaphoretic.  Psychiatric: She has a normal mood and affect. Her behavior is normal.      Assessment & Plan:  3:26 PM- Discussed treatment plan with pt which includes discharge home with antibiotics. Pt agrees to plan.  Frequency of urination - Plan: POCT urinalysis dipstick, POCT UA - Microscopic Only  UTI'S, RECURRENT  UTI (urinary tract infection) - Plan: Urine culture  Meds ordered this encounter  Medications  . nitrofurantoin, macrocrystal-monohydrate, (MACROBID) 100 MG capsule    Sig: Take 1 capsule (100 mg total) by mouth 2 (two) times daily.    Dispense:  14 capsule    Refill:  0    I personally performed the services described in this documentation, which was scribed in my presence. The recorded information has been reviewed and considered, and addended by me as needed.  Norberto Sorenson, MD MPH

## 2013-05-21 LAB — URINE CULTURE: Colony Count: >=100000

## 2013-06-01 ENCOUNTER — Other Ambulatory Visit: Payer: Self-pay

## 2013-07-05 ENCOUNTER — Encounter: Payer: Self-pay | Admitting: Cardiovascular Disease

## 2013-08-28 ENCOUNTER — Other Ambulatory Visit: Payer: Self-pay

## 2013-08-28 DIAGNOSIS — I712 Thoracic aortic aneurysm, without rupture, unspecified: Secondary | ICD-10-CM

## 2013-08-28 DIAGNOSIS — C3490 Malignant neoplasm of unspecified part of unspecified bronchus or lung: Secondary | ICD-10-CM

## 2013-08-29 ENCOUNTER — Encounter: Payer: Self-pay | Admitting: Emergency Medicine

## 2013-08-29 ENCOUNTER — Ambulatory Visit (INDEPENDENT_AMBULATORY_CARE_PROVIDER_SITE_OTHER): Payer: Medicare Other | Admitting: Emergency Medicine

## 2013-08-29 VITALS — BP 133/81 | HR 60 | Temp 97.5°F | Resp 16 | Ht 66.0 in | Wt 144.8 lb

## 2013-08-29 DIAGNOSIS — E7800 Pure hypercholesterolemia, unspecified: Secondary | ICD-10-CM

## 2013-08-29 DIAGNOSIS — Z1211 Encounter for screening for malignant neoplasm of colon: Secondary | ICD-10-CM

## 2013-08-29 DIAGNOSIS — M810 Age-related osteoporosis without current pathological fracture: Secondary | ICD-10-CM

## 2013-08-29 DIAGNOSIS — K219 Gastro-esophageal reflux disease without esophagitis: Secondary | ICD-10-CM

## 2013-08-29 DIAGNOSIS — I10 Essential (primary) hypertension: Secondary | ICD-10-CM

## 2013-08-29 DIAGNOSIS — Z0000 Encounter for general adult medical examination without abnormal findings: Secondary | ICD-10-CM

## 2013-08-29 LAB — POCT URINALYSIS DIPSTICK
Bilirubin, UA: NEGATIVE
GLUCOSE UA: NEGATIVE
Ketones, UA: NEGATIVE
Leukocytes, UA: NEGATIVE
NITRITE UA: NEGATIVE
PH UA: 6.5
RBC UA: NEGATIVE
SPEC GRAV UA: 1.025
UROBILINOGEN UA: 1

## 2013-08-29 LAB — POCT UA - MICROSCOPIC ONLY
Bacteria, U Microscopic: NEGATIVE
CRYSTALS, UR, HPF, POC: NEGATIVE
RBC, URINE, MICROSCOPIC: NEGATIVE
RENAL TUBULAR CELLS: POSITIVE
Yeast, UA: NEGATIVE

## 2013-08-29 LAB — CBC WITH DIFFERENTIAL/PLATELET
BASOS ABS: 0.1 10*3/uL (ref 0.0–0.1)
BASOS PCT: 1 % (ref 0–1)
EOS PCT: 1 % (ref 0–5)
Eosinophils Absolute: 0 10*3/uL (ref 0.0–0.7)
HCT: 38.9 % (ref 36.0–46.0)
Hemoglobin: 13 g/dL (ref 12.0–15.0)
LYMPHS PCT: 37 % (ref 12–46)
Lymphs Abs: 2.1 10*3/uL (ref 0.7–4.0)
MCH: 30 pg (ref 26.0–34.0)
MCHC: 33.4 g/dL (ref 30.0–36.0)
MCV: 89.8 fL (ref 78.0–100.0)
MONO ABS: 0.5 10*3/uL (ref 0.1–1.0)
Monocytes Relative: 8 % (ref 3–12)
Neutro Abs: 3.1 10*3/uL (ref 1.7–7.7)
Neutrophils Relative %: 53 % (ref 43–77)
PLATELETS: 332 10*3/uL (ref 150–400)
RBC: 4.33 MIL/uL (ref 3.87–5.11)
RDW: 13.9 % (ref 11.5–15.5)
WBC: 5.8 10*3/uL (ref 4.0–10.5)

## 2013-08-29 LAB — COMPLETE METABOLIC PANEL WITH GFR
ALK PHOS: 66 U/L (ref 39–117)
ALT: 10 U/L (ref 0–35)
AST: 19 U/L (ref 0–37)
Albumin: 4.3 g/dL (ref 3.5–5.2)
BILIRUBIN TOTAL: 1.1 mg/dL (ref 0.2–1.2)
BUN: 14 mg/dL (ref 6–23)
CALCIUM: 9.7 mg/dL (ref 8.4–10.5)
CHLORIDE: 102 meq/L (ref 96–112)
CO2: 28 mEq/L (ref 19–32)
CREATININE: 0.79 mg/dL (ref 0.50–1.10)
GFR, Est African American: 84 mL/min
GFR, Est Non African American: 72 mL/min
Glucose, Bld: 83 mg/dL (ref 70–99)
Potassium: 4.1 mEq/L (ref 3.5–5.3)
Sodium: 140 mEq/L (ref 135–145)
Total Protein: 7.4 g/dL (ref 6.0–8.3)

## 2013-08-29 LAB — LIPID PANEL
CHOL/HDL RATIO: 3.7 ratio
CHOLESTEROL: 274 mg/dL — AB (ref 0–200)
HDL: 75 mg/dL (ref >39)
LDL CALC: 180 mg/dL — AB (ref 0–99)
TRIGLYCERIDES: 97 mg/dL (ref <150)
VLDL: 19 mg/dL (ref 0–40)

## 2013-08-29 LAB — IFOBT (OCCULT BLOOD): IMMUNOLOGICAL FECAL OCCULT BLOOD TEST: NEGATIVE

## 2013-08-29 MED ORDER — ESOMEPRAZOLE MAGNESIUM 40 MG PO CPDR: 40.0000 mg | DELAYED_RELEASE_CAPSULE | Freq: Every day | ORAL | Status: DC

## 2013-08-29 MED ORDER — VERAPAMIL HCL ER 120 MG PO TBCR: 120.0000 mg | EXTENDED_RELEASE_TABLET | Freq: Every day | ORAL | Status: DC

## 2013-08-29 MED ORDER — ATORVASTATIN CALCIUM 20 MG PO TABS: 20.0000 mg | ORAL_TABLET | Freq: Every day | ORAL | Status: DC

## 2013-08-29 MED ORDER — METOPROLOL SUCCINATE ER 25 MG PO TB24: 12.5000 mg | ORAL_TABLET | Freq: Every day | ORAL | Status: DC | PRN

## 2013-08-29 NOTE — Progress Notes (Signed)
Subjective:    Patient ID: Haley [name], female    DOB: 03-May-1934, 79 y.o.   MRN: [id number] This chart was scribed for Darlyne Russian, MD by Rolanda Lundborg, ED Scribe. This patient was seen in room 22 and the patient's care was started at 1:40 PM.  Chief Complaint  Patient presents with  . Annual Exam    HPI HPI Comments: Haley [name] is a 80 y.o. female with a h/o HTN, GERD, hypercholesterolemia, and breast carcinoma who presents to the [facility] for an annual physical exam.   She reports having problems with her reflux recently. She sees Dr Verl Blalock for this but he is retiring. She had pH probe down 4-5 years ago and was told there was a problem with the way her food goes down. She was taking 2 40mg  Nexium per day but they cut her back to one per day. She started taking Aloecran and probiotics with some relief. She was on Lipitor previously but not anymore.  She saw Dr Truddie Coco for her breast cancer. She has been getting routine mammograms. She had lung cancer last year and is being followed by Dr [name]. Her eye doctor is Alois Cliche.   Past Medical History  Diagnosis Date  . Urinary tract infection, site not specified   . Symptomatic menopausal or female climacteric states   . Fatty liver   . Bronchitis, not specified as acute or chronic   . Hiatal hernia   . Atrophic gastritis without mention of hemorrhage   . Cardiac dysrhythmia, unspecified   . Pneumonia, organism unspecified   . Pure hypercholesterolemia   . Unspecified essential hypertension   . Cough   . Irritable bowel syndrome   . Other chest pain   . Esophageal reflux   . Chronic pancreatitis   . Flatulence, eructation, and gas pain   . Gastric erosions     [name]  . Irritable bowel syndrome   . Family history of malignant neoplasm of gastrointestinal tract   . Sorethroat     since virus 2 months ago  . Hoarseness     since virus 2 months agp  . Complication of  anesthesia   . PONV (postoperative nausea and vomiting)     with hemorrhoid surgery years ago, no n/v with breast surgery  . Shortness of breath     with Exertion  . Malignant neoplasm of breast (female), unspecified site     Skin Cancer legs, face, shoulder, some basal some squamous  . Arthritis     knees and left hip   Current Outpatient Prescriptions on File Prior to Visit  Medication Sig Dispense Refill  . atorvastatin (LIPITOR) 20 MG tablet Take 1 tablet (20 mg total) by mouth daily.  90 tablet  3  . b complex vitamins tablet Take 1 tablet by mouth daily.       . Cholecalciferol (VITAMIN D3) 2000 UNITS TABS Take 1 tablet by mouth daily.        Marland Kitchen esomeprazole (NEXIUM) 40 MG capsule Take 1 capsule (40 mg total) by mouth daily.  90 capsule  11  . metoprolol succinate (TOPROL-XL) 25 MG 24 hr tablet Take 0.5 tablets (12.5 mg total) by mouth daily as needed. FOR HYPERTENSION  30 tablet  3  . nitrofurantoin, macrocrystal-monohydrate, (MACROBID) 100 MG capsule Take 1 capsule (100 mg total) by mouth 2 (two) times daily.  14 capsule  0  . Probiotic Product (PROBIOTIC FORMULA) CAPS Take  1 capsule by mouth daily.        Marland Kitchen UNABLE TO FIND Take 1 capsule by mouth 2 (two) times daily. HydroEye      . verapamil (CALAN-SR) 120 MG CR tablet Take 1 tablet (120 mg total) by mouth at bedtime.  30 tablet  11   No current facility-administered medications on file prior to visit.   No Known Allergies    Review of Systems  Constitutional: Negative for fatigue and unexpected weight change.  Respiratory: Negative for chest tightness and shortness of breath.   Cardiovascular: Negative for chest pain, palpitations and leg swelling.  Gastrointestinal: Negative for abdominal pain and blood in stool.  Neurological: Negative for dizziness, syncope, light-headedness and headaches.       Objective:   Physical Exam CONSTITUTIONAL: Well developed/well nourished HEAD: Normocephalic/atraumatic EYES:  EOMI/PERRL ENMT: Mucous membranes moist NECK: supple no meningeal signs SPINE:entire spine nontender CV: S1/S2 noted, no murmurs/rubs/gallops noted LUNGS: Lungs are clear to auscultation bilaterally, no apparent distress ABDOMEN: soft, nontender, no rebound or guarding GU:no cva tenderness NEURO: Pt is awake/alert, moves all extremitiesx4 EXTREMITIES: pulses normal, full ROM SKIN: warm, color normal there is a scar present anteriorly of the right breast with a radiation marker also noted. PSYCH: no abnormalities of mood noted  The patella are normal on digital exam there are no adnexal masses there is a pea size cystic area on the left vaginal wall. Rectal exam reveals a very tight anal area secondary to previous surgery no masses are felt. Filed Vitals:   08/29/13 1400  BP: 133/81  Pulse: 60  Temp: 97.5 F (36.4 C)  TempSrc: Oral  Resp: 16  Height: 5\' 6"  (1.676 m)  Weight: 144 lb 12.8 oz (65.681 kg)  SpO2: 97%          Assessment & Plan:   1. HYPERTENSION   2. HYPERCHOLESTEROLEMIA   3. Routine general medical examination at a health care facility     Meds will be refilled. I have ordered a bone density study for her .

## 2013-08-29 NOTE — Progress Notes (Deleted)
   Subjective:    Patient ID: Haley [name], female    DOB: 1933/11/08, 80 y.o.   MRN: [id number]  HPI    Review of Systems  Constitutional: Negative.   HENT: Positive for drooling, hearing loss and mouth sores.   Eyes: Negative.   Respiratory: Negative.   Cardiovascular: Negative.   Gastrointestinal: Negative.   Endocrine: Negative.   Genitourinary: Positive for urgency and frequency.  Musculoskeletal: Positive for arthralgias.  Skin: Negative.   Allergic/Immunologic: Negative.   Neurological: Negative.   Hematological: Negative.   Psychiatric/Behavioral: Negative.        Objective:   Physical Exam        Assessment & Plan:

## 2013-09-19 ENCOUNTER — Encounter: Payer: Self-pay | Admitting: Thoracic Surgery (Cardiothoracic Vascular Surgery)

## 2013-09-19 ENCOUNTER — Ambulatory Visit
Admission: RE | Admit: 2013-09-19 | Discharge: 2013-09-19 | Disposition: A | Discharge status: Home / Self Care | Payer: Medicare Other | Source: Ambulatory Visit | Attending: Thoracic Surgery (Cardiothoracic Vascular Surgery) | Admitting: Thoracic Surgery (Cardiothoracic Vascular Surgery)

## 2013-09-19 ENCOUNTER — Ambulatory Visit (INDEPENDENT_AMBULATORY_CARE_PROVIDER_SITE_OTHER): Payer: Medicare Other | Admitting: Thoracic Surgery (Cardiothoracic Vascular Surgery)

## 2013-09-19 VITALS — BP 112/70 | HR 60 | Resp 20 | Ht 66.0 in | Wt 144.0 lb

## 2013-09-19 DIAGNOSIS — I712 Thoracic aortic aneurysm, without rupture, unspecified: Secondary | ICD-10-CM

## 2013-09-19 DIAGNOSIS — C3490 Malignant neoplasm of unspecified part of unspecified bronchus or lung: Secondary | ICD-10-CM

## 2013-09-19 NOTE — Progress Notes (Signed)
HPI:  Haley [name] returns today for a scheduled followup visit. She had a wedge resection of the left upper lobe for a groundglass nodule that turned out to be a stage IA adenocarcinoma with lepidic growth last February. She was last in the office in October at which time she was doing well.  In the interim since her last visit she says she's been doing well. She's not had any significant issues with her breathing. She denies coughing, wheezing, hemoptysis. Her weight has been stable. Overall she feels well.  Past Medical History  Diagnosis Date  . Urinary tract infection, site not specified   . Symptomatic menopausal or female climacteric states   . Fatty liver   . Bronchitis, not specified as acute or chronic   . Hiatal hernia   . Atrophic gastritis without mention of hemorrhage   . Cardiac dysrhythmia, unspecified   . Pneumonia, organism unspecified   . Pure hypercholesterolemia   . Unspecified essential hypertension   . Cough   . Irritable bowel syndrome   . Other chest pain   . Esophageal reflux   . Chronic pancreatitis   . Flatulence, eructation, and gas pain   . Gastric erosions     Haley [name]  . Irritable bowel syndrome   . Family history of malignant neoplasm of gastrointestinal tract   . Sorethroat     since virus 2 months ago  . Hoarseness     since virus 2 months agp  . Complication of anesthesia   . PONV (postoperative nausea and vomiting)     with hemorrhoid surgery years ago, no n/v with breast surgery  . Shortness of breath     with Exertion  . Malignant neoplasm of breast (female), unspecified site     Skin Cancer legs, face, shoulder, some basal some squamous  . Arthritis     knees and left hip  . Cataract       Current Outpatient Prescriptions  Medication Sig Dispense Refill  . aspirin 81 MG tablet Take 81 mg by mouth daily.      Marland Kitchen atorvastatin (LIPITOR) 20 MG tablet Take 1 tablet (20 mg total) by mouth daily.  90 tablet  3  . b complex vitamins  tablet Take 1 tablet by mouth daily.       . Cholecalciferol (VITAMIN D3) 2000 UNITS TABS Take 1 tablet by mouth daily.        Marland Kitchen esomeprazole (NEXIUM) 40 MG capsule Take 1 capsule (40 mg total) by mouth daily.  90 capsule  3  . metoprolol succinate (TOPROL-XL) 25 MG 24 hr tablet Take 0.5 tablets (12.5 mg total) by mouth daily as needed. FOR HYPERTENSION  90 tablet  3  . NON FORMULARY Aloe Cram  Mix with water daily.      . Probiotic Product (PROBIOTIC FORMULA) CAPS Take 1 capsule by mouth daily.        Marland Kitchen UNABLE TO FIND Take 1 capsule by mouth 2 (two) times daily. HydroEye      . verapamil (CALAN-SR) 120 MG CR tablet Take 1 tablet (120 mg total) by mouth at bedtime.  90 tablet  3   No current facility-administered medications for this visit.    Physical Exam BP 112/70  Pulse 60  Resp 20  Ht 5\' 6"  (1.676 m)  Wt 144 lb (65.318 kg)  BMI 23.25 kg/m2  SpO62 69% 80 year old woman in no acute distress General well-developed well-nourished Cardiac regular rate and rhythm normal S1 and S2 No  cervical or supraclavicular adenopathy Lungs clear with equal breath sounds bilaterally Incisions well healed  Diagnostic Tests: CT chest 09/19/2013 CT CHEST WITHOUT CONTRAST  TECHNIQUE:  Multidetector CT imaging of the chest was performed following the  standard protocol without IV contrast.  COMPARISON: 05/16/2013  IMPRESSION:  Stable exam. No findings to suggest recurrent disease.  Probable progressive atelectasis in the right middle lobe. Continued  attention to this region on followup imaging is recommended.  FINDINGS: AT:  FINDINGS: AT  There is no axillary lymphadenopathy. No mediastinal  lymphadenopathy. Tiny lymph nodes in the AP window and precarinal  station are unchanged in the interval. The heart size is at upper  normal. Coronary artery calcification is noted. There is no evidence  for pericardial or pleural effusion.  The tiny ill-defined linear opacity in the right apex is  unchanged.  Surgical scar in the posterior left upper lobe is unchanged. There  is some probable atelectasis in the medial right middle lobe which  has increased slightly in the interval (see image 35 of series 601  and image 58 of series 602). No new parenchymal nodule or mass.  Bone windows reveal no worrisome lytic or sclerotic osseous lesions.  Adrenal glands have an included on the lower cuts of the study and  show no adrenal nodule.  Electronically Signed  By: Misty Stanley M.D.  On: 09/19/2013 12:06  Impression: 80 year old woman who is now one year post wedge resection for stage IA adenocarcinoma with lepidic growth. She has no evidence of recurrent disease. There is however some atelectasis in the right middle lobe. She's always had some degree of change in that lobe on imaging. It had been assumed to be scar. However, the radiologist today he feels that it has progressed since her last scan 5 months ago. There is no mass lesion apparent, however a small endobronchial lesion could be missed on CT.  I think her options would be to do a bronchoscopy or to repeat her CT scan in relatively short period of time (3 months). I'm afraid that bronchoscopy would be pretty low yield in this setting, so I recommended a short interval repeat CT scan. I suspect this will turn out to be benign.  The groundglass nodule/scar in the right upper lobe is unchanged.  Plan:  Return in 3 months with CT of chest

## 2013-10-05 ENCOUNTER — Ambulatory Visit (INDEPENDENT_AMBULATORY_CARE_PROVIDER_SITE_OTHER): Payer: Medicare Other | Admitting: Emergency Medicine

## 2013-10-05 VITALS — BP 128/72 | HR 60 | Temp 97.8°F | Resp 16 | Ht 66.0 in | Wt 149.2 lb

## 2013-10-05 DIAGNOSIS — R350 Frequency of micturition: Secondary | ICD-10-CM

## 2013-10-05 DIAGNOSIS — N3000 Acute cystitis without hematuria: Secondary | ICD-10-CM

## 2013-10-05 DIAGNOSIS — M810 Age-related osteoporosis without current pathological fracture: Secondary | ICD-10-CM

## 2013-10-05 DIAGNOSIS — R300 Dysuria: Secondary | ICD-10-CM

## 2013-10-05 LAB — POCT URINALYSIS DIPSTICK
Bilirubin, UA: NEGATIVE
Blood, UA: NEGATIVE
Glucose, UA: 100
Ketones, UA: NEGATIVE
Nitrite, UA: POSITIVE
Protein, UA: NEGATIVE
Spec Grav, UA: <=1.005
Urobilinogen, UA: 1
pH, UA: 5

## 2013-10-05 LAB — POCT UA - MICROSCOPIC ONLY
Casts, Ur, LPF, POC: NEGATIVE
Crystals, Ur, HPF, POC: NEGATIVE
Mucus, UA: NEGATIVE
RBC, urine, microscopic: NEGATIVE
Yeast, UA: NEGATIVE

## 2013-10-05 MED ORDER — PHENAZOPYRIDINE HCL 200 MG PO TABS: 200.0000 mg | ORAL_TABLET | Freq: Three times a day (TID) | ORAL | Status: DC | PRN

## 2013-10-05 MED ORDER — NITROFURANTOIN MONOHYD MACRO 100 MG PO CAPS: 100.0000 mg | ORAL_CAPSULE | Freq: Two times a day (BID) | ORAL | Status: DC

## 2013-10-05 NOTE — Patient Instructions (Signed)
Urinary Tract Infection  Urinary tract infections (UTIs) can develop anywhere along your urinary tract. Your urinary tract is your body's drainage system for removing wastes and extra water. Your urinary tract includes two kidneys, two ureters, a bladder, and a urethra. Your kidneys are a pair of bean-shaped organs. Each kidney is about the size of your fist. They are located below your ribs, one on each side of your spine.  CAUSES  Infections are caused by microbes, which are microscopic organisms, including fungi, viruses, and bacteria. These organisms are so small that they can only be seen through a microscope. Bacteria are the microbes that most commonly cause UTIs.  SYMPTOMS   Symptoms of UTIs may vary by age and gender of the patient and by the location of the infection. Symptoms in young women typically include a frequent and intense urge to urinate and a painful, burning feeling in the bladder or urethra during urination. Older women and men are more likely to be tired, shaky, and weak and have muscle aches and abdominal pain. A fever may mean the infection is in your kidneys. Other symptoms of a kidney infection include pain in your back or sides below the ribs, nausea, and vomiting.  DIAGNOSIS  To diagnose a UTI, your caregiver will ask you about your symptoms. Your caregiver also will ask to provide a urine sample. The urine sample will be tested for bacteria and Mizell blood cells. Salzwedel blood cells are made by your body to help fight infection.  TREATMENT   Typically, UTIs can be treated with medication. Because most UTIs are caused by a bacterial infection, they usually can be treated with the use of antibiotics. The choice of antibiotic and length of treatment depend on your symptoms and the type of bacteria causing your infection.  HOME CARE INSTRUCTIONS   If you were prescribed antibiotics, take them exactly as your caregiver instructs you. Finish the medication even if you feel better after you  have only taken some of the medication.   Drink enough water and fluids to keep your urine clear or pale yellow.   Avoid caffeine, tea, and carbonated beverages. They tend to irritate your bladder.   Empty your bladder often. Avoid holding urine for long periods of time.   Empty your bladder before and after sexual intercourse.   After a bowel movement, women should cleanse from front to back. Use each tissue only once.  SEEK MEDICAL CARE IF:    You have back pain.   You develop a fever.   Your symptoms do not begin to resolve within 3 days.  SEEK IMMEDIATE MEDICAL CARE IF:    You have severe back pain or lower abdominal pain.   You develop chills.   You have nausea or vomiting.   You have continued burning or discomfort with urination.  MAKE SURE YOU:    Understand these instructions.   Will watch your condition.   Will get help right away if you are not doing well or get worse.  Document Released: 04/22/2005 Document Revised: 01/12/2012 Document Reviewed: 08/21/2011  [employer] Patient Information 2014 [employer]

## 2013-10-05 NOTE — Progress Notes (Signed)
[facility] [address] [place] [address] [phone]  Date:  10/05/2013   Name:  Haley [name]   DOB:  Jun 07, 1934   MRN:  [id number]  PCP:  Jenny Reichmann, MD    Chief Complaint: Increased Frequency of Urination and Burning with urination   History of Present Illness:  Haley [name] is a 80 y.o. very pleasant female patient who presents with the following:  Has dysuria, urgency and frequency started yesterday.  Started AZO then with no relief.  No GI, GYN symptoms.  No cough or coryza.  No improvement with over the counter medications or other home remedies. Denies other complaint or health concern today.   Patient Active Problem List   Diagnosis Date Noted  . Ascending aortic aneurysm 05/16/2013  . Lung cancer, upper lobe 05/16/2013  . Paroxysmal SVT (supraventricular tachycardia) 04/03/2013  . Pulmonary nodules 03/20/2011  . GERD (gastroesophageal reflux disease) 03/17/2011  . Esophageal motility disorder 03/17/2011  . Family history of malignant neoplasm of gastrointestinal tract 03/17/2011  . CHEST PAIN 03/19/2009  . CARCINOMA, BREAST 08/13/2007  . HYPERCHOLESTEROLEMIA 08/13/2007  . HYPERTENSION 08/13/2007  . IRREGULAR HEART RATE 08/13/2007  . PNEUMONIA 08/13/2007  . BRONCHITIS 08/13/2007  . GERD 08/13/2007  . IRRITABLE BOWEL SYNDROME 08/13/2007  . UTI'S, RECURRENT 08/13/2007  . POSTMENOPAUSAL STATUS 08/13/2007  . COUGH, CHRONIC 08/13/2007  . CHEST PAIN, NON-CARDIAC 08/13/2007  . GASTRITIS, CHRONIC 03/12/2006    Past Medical History  Diagnosis Date  . Urinary tract infection, site not specified   . Symptomatic menopausal or female climacteric states   . Fatty liver   . Bronchitis, not specified as acute or chronic   . Hiatal hernia   . Atrophic gastritis without mention of hemorrhage   . Cardiac dysrhythmia, unspecified   . Pneumonia, organism unspecified   . Pure hypercholesterolemia   . Unspecified essential hypertension    . Cough   . Irritable bowel syndrome   . Other chest pain   . Esophageal reflux   . Chronic pancreatitis   . Flatulence, eructation, and gas pain   . Gastric erosions     [name]  . Irritable bowel syndrome   . Family history of malignant neoplasm of gastrointestinal tract   . Sorethroat     since virus 2 months ago  . Hoarseness     since virus 2 months agp  . Complication of anesthesia   . PONV (postoperative nausea and vomiting)     with hemorrhoid surgery years ago, no n/v with breast surgery  . Shortness of breath     with Exertion  . Malignant neoplasm of breast (female), unspecified site     Skin Cancer legs, face, shoulder, some basal some squamous  . Arthritis     knees and left hip  . Cataract     Past Surgical History  Procedure Laterality Date  . Mastectomy, partial  2004    right   . Hemorroidectomy    . Cataract extraction      Bilateral  . Colonoscopy    . Eye surgery    . Skin cancer excision    . Video assisted thoracoscopy (vats)/wedge resection Left 09/19/2012    Procedure: LEFT VIDEO ASSISTED THORACOSCOPY (VATS)/LEFT UPPER LOBE WEDGE RESECTION & NODE DISSECTION;  Surgeon: Melrose Nakayama, MD;  Location: [facility];  Service: Thoracic;  Laterality: Left;  . Breast surgery      History  Substance Use Topics  .  Smoking status: Never Smoker   . Smokeless tobacco: Never Used  . Alcohol Use: No    Family History  Problem Relation Age of Onset  . Colon cancer Father 68  . Cancer Father   . Stroke Father   . Diabetes Paternal Aunt   . Heart disease Mother   . Heart disease Brother   . Cancer Brother     mass behind lung-died age 26  . Multiple myeloma Brother 44    died age 21    No Known Allergies  Medication list has been reviewed and updated.  Current Outpatient Prescriptions on File Prior to Visit  Medication Sig Dispense Refill  . atorvastatin (LIPITOR) 20 MG tablet Take 1 tablet (20 mg total) by mouth daily.  90 tablet  3  . b  complex vitamins tablet Take 1 tablet by mouth daily.       . Cholecalciferol (VITAMIN D3) 2000 UNITS TABS Take 1 tablet by mouth daily.        Marland Kitchen esomeprazole (NEXIUM) 40 MG capsule Take 1 capsule (40 mg total) by mouth daily.  90 capsule  3  . metoprolol succinate (TOPROL-XL) 25 MG 24 hr tablet Take 0.5 tablets (12.5 mg total) by mouth daily as needed. FOR HYPERTENSION  90 tablet  3  . NON FORMULARY Aloe Cram  Mix with water daily.      . Probiotic Product (PROBIOTIC FORMULA) CAPS Take 1 capsule by mouth daily.        . verapamil (CALAN-SR) 120 MG CR tablet Take 1 tablet (120 mg total) by mouth at bedtime.  90 tablet  3  . aspirin 81 MG tablet Take 81 mg by mouth daily.      Marland Kitchen UNABLE TO FIND Take 1 capsule by mouth 2 (two) times daily. HydroEye       No current facility-administered medications on file prior to visit.    Review of Systems:  As per HPI, otherwise negative.    Physical Examination: Filed Vitals:   10/05/13 1159  BP: 128/72  Pulse: 60  Temp: 97.8 F (36.6 C)  Resp: 16   Filed Vitals:   10/05/13 1159  Height: _0  (1.676 m)  Weight: 149 lb 3.2 oz (67.677 kg)   Body mass index is 24.09 kg/(m^2). Ideal Body Weight: Weight in (lb) to have BMI = 25: 154.6   GEN: WDWN, NAD, Non-toxic, Alert & Oriented x 3 HEENT: Atraumatic, Normocephalic.  Ears and Nose: No external deformity. EXTR: No clubbing/cyanosis/edema NEURO: Normal gait.  PSYCH: Normally interactive. Conversant. Not depressed or anxious appearing.  Calm demeanor.    Assessment and Plan: Acute cystitis macrobid   Signed,  Ellison Carwin, MD  Results for orders placed in visit on 10/05/13  POCT UA - MICROSCOPIC ONLY      Result Value Ref Range   WBC, Ur, HPF, POC 25-30     RBC, urine, microscopic neg     Bacteria, U Microscopic trace     Mucus, UA neg     Epithelial cells, urine per micros 0-1     Crystals, Ur, HPF, POC neg     Casts, Ur, LPF, POC neg     Yeast, UA neg    POCT  URINALYSIS DIPSTICK      Result Value Ref Range   Color, UA orange     Clarity, UA clear     Glucose, UA 100     Bilirubin, UA neg     Ketones, UA neg  Spec Grav, UA <=1.005     Blood, UA neg     pH, UA 5.0     Protein, UA neg     Urobilinogen, UA 1.0     Nitrite, UA pos     Leukocytes, UA small (1+)

## 2013-11-02 ENCOUNTER — Other Ambulatory Visit: Payer: Self-pay | Admitting: *Deleted

## 2013-11-02 DIAGNOSIS — C3410 Malignant neoplasm of upper lobe, unspecified bronchus or lung: Secondary | ICD-10-CM

## 2013-12-19 ENCOUNTER — Ambulatory Visit (INDEPENDENT_AMBULATORY_CARE_PROVIDER_SITE_OTHER): Payer: Medicare Other | Admitting: Thoracic Surgery (Cardiothoracic Vascular Surgery)

## 2013-12-19 ENCOUNTER — Encounter: Payer: Self-pay | Admitting: Thoracic Surgery (Cardiothoracic Vascular Surgery)

## 2013-12-19 ENCOUNTER — Ambulatory Visit
Admission: RE | Admit: 2013-12-19 | Discharge: 2013-12-19 | Disposition: A | Discharge status: Home / Self Care | Payer: Medicare Other | Source: Ambulatory Visit | Attending: Thoracic Surgery (Cardiothoracic Vascular Surgery) | Admitting: Thoracic Surgery (Cardiothoracic Vascular Surgery)

## 2013-12-19 VITALS — BP 130/83 | HR 64 | Resp 20 | Ht 66.0 in | Wt 149.0 lb

## 2013-12-19 DIAGNOSIS — R911 Solitary pulmonary nodule: Secondary | ICD-10-CM

## 2013-12-19 DIAGNOSIS — C3410 Malignant neoplasm of upper lobe, unspecified bronchus or lung: Secondary | ICD-10-CM

## 2013-12-19 NOTE — Progress Notes (Signed)
HPI:  Haley [name] returns today for a 3 month follow up visit. 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 I saw her in February for her one-year followup. A CT of the chest at that time showed some isolated atelectatic lung in the right middle lobe. We discussed bronchoscopy versus interval followup and elected to do a 3 month followup scan. She now returns for that purpose.  She says that she's been feeling well. Her weight has been stable. She's not having any chest pain or shortness of breath. She has an occasional cough, but nothing out of the ordinary. She is a lifelong nonsmoker.  Past Medical History  Diagnosis Date  . Urinary tract infection, site not specified   . Symptomatic menopausal or female climacteric states   . Fatty liver   . Bronchitis, not specified as acute or chronic   . Hiatal hernia   . Atrophic gastritis without mention of hemorrhage   . Cardiac dysrhythmia, unspecified   . Pneumonia, organism unspecified   . Pure hypercholesterolemia   . Unspecified essential hypertension   . Cough   . Irritable bowel syndrome   . Other chest pain   . Esophageal reflux   . Chronic pancreatitis   . Flatulence, eructation, and gas pain   . Gastric erosions     [name]  . Irritable bowel syndrome   . Family history of malignant neoplasm of gastrointestinal tract   . Sorethroat     since virus 2 months ago  . Hoarseness     since virus 2 months agp  . Complication of anesthesia   . PONV (postoperative nausea and vomiting)     with hemorrhoid surgery years ago, no n/v with breast surgery  . Shortness of breath     with Exertion  . Malignant neoplasm of breast (female), unspecified site     Skin Cancer legs, face, shoulder, some basal some squamous  . Arthritis     knees and left hip  . Cataract       Current Outpatient Prescriptions   Medication Sig Dispense Refill  . aspirin 81 MG tablet Take 81 mg by mouth daily.      Marland Kitchen atorvastatin (LIPITOR) 20 MG tablet Take 1 tablet (20 mg total) by mouth daily.  90 tablet  3  . b complex vitamins tablet Take 1 tablet by mouth daily.       . Cholecalciferol (VITAMIN D3) 2000 UNITS TABS Take 1 tablet by mouth daily.        Marland Kitchen esomeprazole (NEXIUM) 40 MG capsule Take 1 capsule (40 mg total) by mouth daily.  90 capsule  3  . metoprolol succinate (TOPROL-XL) 25 MG 24 hr tablet Take 0.5 tablets (12.5 mg total) by mouth daily as needed. FOR HYPERTENSION  90 tablet  3  . NON FORMULARY Aloe Cram  Mix with water daily.      . Probiotic Product (PROBIOTIC FORMULA) CAPS Take 1 capsule by mouth daily.        Marland Kitchen UNABLE TO FIND Take 1 capsule by mouth 2 (two) times daily. HydroEye      . verapamil (CALAN-SR) 120 MG CR tablet Take 1 tablet (120 mg total) by mouth at bedtime.  90 tablet  3   No current facility-administered medications for this visit.    Physical Exam BP 130/83  Pulse 64  Resp 20  Ht 5\' 6"  (1.676 m)  Wt 149 lb (67.586 kg)  BMI 24.06 kg/m2  SpO74 7% 80 year old woman in no acute distress Well-developed well-nourished Alert and oriented x3 with no focal neurologic deficits Lungs clear with equal breath sounds bilaterally, no wheezing No cervical or supraclavicular adenopathy Heart attack regular rate and rhythm  Diagnostic Tests: CT chest 12/19/2013 CT CHEST WITHOUT CONTRAST  TECHNIQUE:  Multidetector CT imaging of the chest was performed following the  standard protocol without IV contrast.  COMPARISON: 09/19/2013  FINDINGS:  Postop changes from previous wedge resection in the posterior left  upper lobe are stable. Scarring is seen in the medial aspect of  right middle lobe along the right heart border which is also stable  in appearance. Scarring in the anterior right lung apex is also  stable. No suspicious pulmonary nodules or masses are identified. No   evidence of central endobronchial lesion.  No evidence of pleural or pericardial effusion. No hilar or  mediastinal lymphadenopathy identified in this noncontrast study. No  evidence of chest wall mass or suspicious bone lesions. Both adrenal  glands are normal in appearance.  IMPRESSION:  Stable exam. No evidence of recurrent carcinoma or other active  disease within the thorax.  Electronically Signed  By: Earle Gell M.D.  On: 12/19/2013 13:52   Impression: 80 year old woman who is now over a year out from a wedge resection for a stage IA adenocarcinoma the left upper lobe. On her one-year followup CT she had some new atelectasis in the right middle lobe. In 3 month interval this has improved slightly. There is still some scarring and bronchiectasis in this area which would suggest it's a chronic process that will not completely resolve. It certainly has not progressed.  80 year old woman who is now 15 months post resection for stage IA adenocarcinoma. No evidence recurrent disease. We will plan to see her back in 9 months which will be her 2 year followup visit and get her back onto her every 6 months schedule for follow up going forward.  Plan: Return in 9 months with CT of chest

## 2014-01-02 ENCOUNTER — Other Ambulatory Visit: Payer: Self-pay

## 2014-01-02 DIAGNOSIS — Z1231 Encounter for screening mammogram for malignant neoplasm of breast: Secondary | ICD-10-CM

## 2014-02-19 ENCOUNTER — Ambulatory Visit
Admission: RE | Admit: 2014-02-19 | Discharge: 2014-02-19 | Disposition: A | Discharge status: Home / Self Care | Payer: Medicare Other | Source: Ambulatory Visit

## 2014-02-19 ENCOUNTER — Ambulatory Visit
Admission: RE | Admit: 2014-02-19 | Discharge: 2014-02-19 | Disposition: A | Discharge status: Home / Self Care | Payer: Medicare Other | Source: Ambulatory Visit | Attending: Emergency Medicine | Admitting: Emergency Medicine

## 2014-02-19 DIAGNOSIS — M810 Age-related osteoporosis without current pathological fracture: Secondary | ICD-10-CM

## 2014-02-19 DIAGNOSIS — Z1231 Encounter for screening mammogram for malignant neoplasm of breast: Secondary | ICD-10-CM

## 2014-02-23 ENCOUNTER — Telehealth: Payer: Self-pay | Admitting: *Deleted

## 2014-02-23 NOTE — Telephone Encounter (Signed)
Received report- Bone Density Scan was normal.  LM to advise pt results normal.  Sent Copy to scan to chart.

## 2014-02-27 ENCOUNTER — Telehealth: Payer: Self-pay | Admitting: *Deleted

## 2014-02-27 NOTE — Telephone Encounter (Signed)
Pt left msg of lab vm. Returned call in regards to her bone density results. Left msg in regards to them being normal.

## 2014-03-20 ENCOUNTER — Ambulatory Visit: Payer: Medicare Other | Admitting: Emergency Medicine

## 2014-03-30 ENCOUNTER — Ambulatory Visit: Payer: Medicare Other | Admitting: Emergency Medicine

## 2014-04-04 ENCOUNTER — Other Ambulatory Visit: Payer: Self-pay | Admitting: Dermatology

## 2014-04-24 ENCOUNTER — Ambulatory Visit (INDEPENDENT_AMBULATORY_CARE_PROVIDER_SITE_OTHER): Payer: Medicare Other | Admitting: Emergency Medicine

## 2014-04-24 VITALS — BP 130/82 | HR 73 | Temp 97.9°F | Resp 16 | Ht 66.0 in | Wt 143.0 lb

## 2014-04-24 DIAGNOSIS — M810 Age-related osteoporosis without current pathological fracture: Secondary | ICD-10-CM

## 2014-04-24 DIAGNOSIS — R1011 Right upper quadrant pain: Secondary | ICD-10-CM

## 2014-04-24 DIAGNOSIS — Z23 Encounter for immunization: Secondary | ICD-10-CM

## 2014-04-24 NOTE — Progress Notes (Signed)
   Subjective:    Patient ID: Haley [name], female    DOB: 07-05-1934, 80 y.o.   MRN: [id number]  HPI patient states on Sunday she had an episode of abdominal bloating and discomfort she states she was able to burp and felt significantly better. She did not have any history of vomiting. She has a history of a hiatal hernia and is on medication for this. She had history of breast cancer and lung cancer. She did have a recent bone density study . She would like to discuss these results.    Review of Systems     Objective:   Physical Exam  Constitutional: She appears well-developed and well-nourished.  HENT:  Head: Normocephalic.  Eyes: Pupils are equal, round, and reactive to light.  Neck: No thyromegaly present.  Cardiovascular: Normal rate, regular rhythm and normal heart sounds.   Pulmonary/Chest: Effort normal and breath sounds normal. No respiratory distress. She has no wheezes. She has no rales.  Abdominal: Soft. She exhibits no distension. There is no tenderness.          Assessment & Plan:  Patient with an episode of right upper quadrant abdominal pain associated with belching and bloating very suspicious for gallbladder disease. We'll schedule an ultrasound of the right upper quadrant. We'll continue calcium and vitamin D for her osteoporosis. She was given a flu shot and Prevnar vaccine

## 2014-05-04 ENCOUNTER — Ambulatory Visit
Admission: RE | Admit: 2014-05-04 | Discharge: 2014-05-04 | Disposition: A | Discharge status: Home / Self Care | Payer: Medicare Other | Source: Ambulatory Visit | Attending: Emergency Medicine | Admitting: Emergency Medicine

## 2014-05-04 DIAGNOSIS — R1011 Right upper quadrant pain: Secondary | ICD-10-CM

## 2014-08-30 ENCOUNTER — Other Ambulatory Visit: Payer: Self-pay | Admitting: Emergency Medicine

## 2014-09-11 ENCOUNTER — Encounter: Payer: Self-pay | Admitting: Emergency Medicine

## 2014-09-11 ENCOUNTER — Ambulatory Visit (INDEPENDENT_AMBULATORY_CARE_PROVIDER_SITE_OTHER): Payer: Medicare Other | Admitting: Emergency Medicine

## 2014-09-11 VITALS — BP 136/78 | HR 51 | Temp 97.7°F | Resp 16 | Ht 66.0 in | Wt 146.0 lb

## 2014-09-11 DIAGNOSIS — E7800 Pure hypercholesterolemia, unspecified: Secondary | ICD-10-CM

## 2014-09-11 DIAGNOSIS — R001 Bradycardia, unspecified: Secondary | ICD-10-CM

## 2014-09-11 DIAGNOSIS — K219 Gastro-esophageal reflux disease without esophagitis: Secondary | ICD-10-CM

## 2014-09-11 DIAGNOSIS — Z0000 Encounter for general adult medical examination without abnormal findings: Secondary | ICD-10-CM

## 2014-09-11 DIAGNOSIS — I10 Essential (primary) hypertension: Secondary | ICD-10-CM

## 2014-09-11 DIAGNOSIS — N898 Other specified noninflammatory disorders of vagina: Secondary | ICD-10-CM

## 2014-09-11 LAB — COMPLETE METABOLIC PANEL WITH GFR
ALK PHOS: 56 U/L (ref 39–117)
ALT: 9 U/L (ref 0–35)
AST: 14 U/L (ref 0–37)
Albumin: 3.9 g/dL (ref 3.5–5.2)
BUN: 14 mg/dL (ref 6–23)
CO2: 30 meq/L (ref 19–32)
Calcium: 9 mg/dL (ref 8.4–10.5)
Chloride: 105 mEq/L (ref 96–112)
Creat: 0.76 mg/dL (ref 0.50–1.10)
GFR, EST NON AFRICAN AMERICAN: 75 mL/min
GFR, Est African American: 87 mL/min
Glucose, Bld: 82 mg/dL (ref 70–99)
POTASSIUM: 4.8 meq/L (ref 3.5–5.3)
Sodium: 141 mEq/L (ref 135–145)
Total Bilirubin: 1.1 mg/dL (ref 0.2–1.2)
Total Protein: 6.6 g/dL (ref 6.0–8.3)

## 2014-09-11 LAB — CBC WITH DIFFERENTIAL/PLATELET
BASOS PCT: 1 % (ref 0–1)
Basophils Absolute: 0 10*3/uL (ref 0.0–0.1)
EOS ABS: 0 10*3/uL (ref 0.0–0.7)
Eosinophils Relative: 1 % (ref 0–5)
HEMATOCRIT: 39.4 % (ref 36.0–46.0)
HEMOGLOBIN: 12.8 g/dL (ref 12.0–15.0)
Lymphocytes Relative: 41 % (ref 12–46)
Lymphs Abs: 1.9 10*3/uL (ref 0.7–4.0)
MCH: 29.8 pg (ref 26.0–34.0)
MCHC: 32.5 g/dL (ref 30.0–36.0)
MCV: 91.8 fL (ref 78.0–100.0)
MONO ABS: 0.5 10*3/uL (ref 0.1–1.0)
MPV: 9.1 fL (ref 8.6–12.4)
Monocytes Relative: 11 % (ref 3–12)
NEUTROS ABS: 2.2 10*3/uL (ref 1.7–7.7)
Neutrophils Relative %: 46 % (ref 43–77)
Platelets: 294 10*3/uL (ref 150–400)
RBC: 4.29 MIL/uL (ref 3.87–5.11)
RDW: 14.3 % (ref 11.5–15.5)
WBC: 4.7 10*3/uL (ref 4.0–10.5)

## 2014-09-11 LAB — POCT URINALYSIS DIPSTICK
BILIRUBIN UA: NEGATIVE
Glucose, UA: NEGATIVE
Ketones, UA: NEGATIVE
Leukocytes, UA: NEGATIVE
Nitrite, UA: NEGATIVE
PROTEIN UA: NEGATIVE
RBC UA: NEGATIVE
Spec Grav, UA: 1.015
UROBILINOGEN UA: 0.2
pH, UA: 5

## 2014-09-11 LAB — LIPID PANEL
Cholesterol: 165 mg/dL (ref 0–200)
HDL: 67 mg/dL (ref >39)
LDL CALC: 72 mg/dL (ref 0–99)
Total CHOL/HDL Ratio: 2.5 Ratio
Triglycerides: 131 mg/dL (ref <150)
VLDL: 26 mg/dL (ref 0–40)

## 2014-09-11 MED ORDER — METOPROLOL SUCCINATE ER 25 MG PO TB24: 12.5000 mg | ORAL_TABLET | Freq: Every day | ORAL | Status: DC | PRN

## 2014-09-11 MED ORDER — ATORVASTATIN CALCIUM 20 MG PO TABS: 20.0000 mg | ORAL_TABLET | Freq: Every day | ORAL | Status: DC

## 2014-09-11 MED ORDER — VERAPAMIL HCL ER 120 MG PO TBCR: EXTENDED_RELEASE_TABLET | ORAL | Status: DC

## 2014-09-11 NOTE — Progress Notes (Signed)
Subjective:  This chart was scribed for Darlyne Russian, MD by Ladene Artist, ED Scribe. The patient was seen in room 21. Patient's care was started at 3:07 PM.   Patient ID: Haley [name], female    DOB: [birth year], 81 y.o.   MRN: [id number]  Chief Complaint  Patient presents with  . Annual Exam    pt not sure if pap test needed   HPI HPI Comments: Haley [name] is a 81 y.o. female, with a h/o cardiac dysrhythmia, hypercholesterolemia, HTN, IBS, skin CA, who [facility]. Pt states that she has been well overall, with the exception of GERD. Pt states that she has tried PPIs with minimal relief. She states that she has tried to be careful not to eat foods that trigger GERD. She states that this could also be attributed to esophageal CA. Pt's last scope was 2 years ago.   Oncology Pt reports a h/o skin CA, breast CA and lung CA. She no longer follows up with an oncologist for breast CA. She has an upcoming CAT scan next week as a follow-up on her lung CA. Pt had lung surgery 2 years ago.   Preventative Maintenance  Pt is followed by [facility] annually for a mammogram. Pt's last endoscopy and colonoscopy was 12/2011. She denies h/o tobacco use or being around smokers.   Past Medical History  Diagnosis Date  . Urinary tract infection, site not specified   . Symptomatic menopausal or female climacteric states   . Fatty liver   . Bronchitis, not specified as acute or chronic   . Hiatal hernia   . Atrophic gastritis without mention of hemorrhage   . Cardiac dysrhythmia, unspecified   . Pneumonia, organism unspecified   . Pure hypercholesterolemia   . Unspecified essential hypertension   . Cough   . Irritable bowel syndrome   . Other chest pain   . Esophageal reflux   . Chronic pancreatitis   . Flatulence, eructation, and gas pain   . Gastric erosions     [name]  . Irritable bowel syndrome   . Family history of  malignant neoplasm of gastrointestinal tract   . Sorethroat     since virus 2 months ago  . Hoarseness     since virus 2 months agp  . Complication of anesthesia   . PONV (postoperative nausea and vomiting)     with hemorrhoid surgery years ago, no n/v with breast surgery  . Shortness of breath     with Exertion  . Malignant neoplasm of breast (female), unspecified site     Skin Cancer legs, face, shoulder, some basal some squamous  . Arthritis     knees and left hip  . Cataract    Current Outpatient Prescriptions on File Prior to Visit  Medication Sig Dispense Refill  . atorvastatin (LIPITOR) 20 MG tablet Take 1 tablet (20 mg total) by mouth daily. 90 tablet 3  . b complex vitamins tablet Take 1 tablet by mouth daily.     . Cholecalciferol (VITAMIN D3) 2000 UNITS TABS Take 1 tablet by mouth daily.      Marland Kitchen co-enzyme Q-10 30 MG capsule Take 300 mg by mouth daily.    . metoprolol succinate (TOPROL-XL) 25 MG 24 hr tablet Take 0.5 tablets (12.5 mg total) by mouth daily as needed. FOR HYPERTENSION 90 tablet 3  . verapamil (CALAN-SR) 120 MG CR tablet Take 1  tablet (120 mg total) by mouth at bedtime. PATIENT NEEDS OFFICE VISIT FOR ADDITIONAL REFILLS 30 tablet 0   No current facility-administered medications on file prior to visit.   No Known Allergies  Review of Systems  HENT: Positive for hearing loss.   Eyes: Positive for discharge.  Cardiovascular: Positive for palpitations.  Gastrointestinal: Positive for abdominal pain.  Genitourinary: Positive for urgency and frequency.  Musculoskeletal: Positive for arthralgias.  Neurological: Positive for headaches.      Objective:   Physical Exam CONSTITUTIONAL: Well developed/well nourished HEAD: Normocephalic/atraumatic EYES: EOMI/PERRL ENMT: Mucous membranes moist NECK: supple no meningeal signs, no bruit  SPINE/BACK:entire spine nontender CV: normal S1/S2 noted, no murmurs/rubs/gallops noted, regular rate & rhythm  CHEST: scar  beneath R breast, no masses felt, R axilla is normal LUNGS: Lungs are clear to auscultation bilaterally, no apparent distress ABDOMEN: soft, nontender, no rebound or guarding, bowel sounds noted throughout abdomen GU: no cva tenderness NEURO: Pt is awake/alert/appropriate, moves all extremitiesx4. No facial droop.   EXTREMITIES: pulses normal/equal, full ROM, no edema, pulses are symmetrical  SKIN: warm, color normal PSYCH: no abnormalities of mood noted, alert and oriented to situation   there is a 0.5 x 0.5 cm polypoid area left upper vaginal vault. The cervix itself looks normal. There are no adnexal masses there is no uterine enlargement Assessment & Plan:  Referral made to Dr. Claiborne Billings for further evaluation of bradycardia referral made to Dr. Elmo Putt to discuss issues with reflux. Referral made to GYN because of a polypoid lesion on the left side of the vaginal vault. She will continue with her yearly mammograms for follow-up of her breast cancer and see her cardiothoracic surgeon on a regular basis follow-up of her adenocarcinoma of the lung.I personally performed the services described in this documentation, which was scribed in my presence. The recorded information has been reviewed and is accurate.

## 2014-09-11 NOTE — Progress Notes (Deleted)
   Subjective:    Patient ID: Haley [name], female    DOB: February 25, 1934, 81 y.o.   MRN: [id number]  HPI    Review of Systems  HENT: Positive for hearing loss.   Eyes: Positive for discharge.  Cardiovascular: Positive for palpitations.  Gastrointestinal: Positive for abdominal pain.  Genitourinary: Positive for urgency and frequency.  Musculoskeletal: Positive for arthralgias.  Neurological: Positive for headaches.       Objective:   Physical Exam        Assessment & Plan:

## 2014-09-12 ENCOUNTER — Encounter: Payer: Self-pay | Admitting: Family Medicine

## 2014-09-14 LAB — PAP IG (IMAGE GUIDED)

## 2014-09-19 ENCOUNTER — Other Ambulatory Visit: Payer: Self-pay | Admitting: *Deleted

## 2014-09-19 DIAGNOSIS — C3412 Malignant neoplasm of upper lobe, left bronchus or lung: Secondary | ICD-10-CM

## 2014-09-19 DIAGNOSIS — I712 Thoracic aortic aneurysm, without rupture, unspecified: Secondary | ICD-10-CM

## 2014-09-27 ENCOUNTER — Encounter: Payer: Self-pay | Admitting: *Deleted

## 2014-10-01 ENCOUNTER — Other Ambulatory Visit: Payer: Self-pay

## 2014-10-01 DIAGNOSIS — E7800 Pure hypercholesterolemia, unspecified: Secondary | ICD-10-CM

## 2014-10-01 MED ORDER — VERAPAMIL HCL ER 120 MG PO TBCR: EXTENDED_RELEASE_TABLET | ORAL | Status: DC

## 2014-10-01 MED ORDER — ATORVASTATIN CALCIUM 20 MG PO TABS: 20.0000 mg | ORAL_TABLET | Freq: Every day | ORAL | Status: DC

## 2014-10-02 ENCOUNTER — Other Ambulatory Visit: Payer: Self-pay

## 2014-10-02 ENCOUNTER — Encounter: Payer: Self-pay | Admitting: Thoracic Surgery (Cardiothoracic Vascular Surgery)

## 2014-10-04 ENCOUNTER — Ambulatory Visit: Payer: Medicare Other | Admitting: Gynecology

## 2014-10-16 ENCOUNTER — Other Ambulatory Visit: Payer: Self-pay | Admitting: *Deleted

## 2014-10-23 ENCOUNTER — Encounter: Payer: Medicare Other | Admitting: Thoracic Surgery (Cardiothoracic Vascular Surgery)

## 2014-10-23 ENCOUNTER — Other Ambulatory Visit: Payer: Self-pay

## 2014-10-23 ENCOUNTER — Ambulatory Visit
Admission: RE | Admit: 2014-10-23 | Discharge: 2014-10-23 | Disposition: A | Discharge status: Home / Self Care | Payer: Medicare Other | Source: Ambulatory Visit | Attending: Thoracic Surgery (Cardiothoracic Vascular Surgery) | Admitting: Thoracic Surgery (Cardiothoracic Vascular Surgery)

## 2014-10-23 DIAGNOSIS — I712 Thoracic aortic aneurysm, without rupture, unspecified: Secondary | ICD-10-CM

## 2014-10-23 DIAGNOSIS — C3412 Malignant neoplasm of upper lobe, left bronchus or lung: Secondary | ICD-10-CM

## 2014-10-23 MED ORDER — IOHEXOL 350 MG/ML SOLN
75.0000 mL | Freq: Once | INTRAVENOUS | Status: AC | PRN
  Administered 2014-10-23: 75 mL via INTRAVENOUS

## 2014-10-24 ENCOUNTER — Encounter: Payer: Self-pay | Admitting: *Deleted

## 2014-10-29 ENCOUNTER — Ambulatory Visit: Payer: Self-pay | Admitting: Cardiovascular Disease

## 2014-10-30 ENCOUNTER — Ambulatory Visit (INDEPENDENT_AMBULATORY_CARE_PROVIDER_SITE_OTHER): Payer: Medicare Other | Admitting: Thoracic Surgery (Cardiothoracic Vascular Surgery)

## 2014-10-30 VITALS — BP 112/66 | HR 59 | Resp 16 | Ht 66.0 in | Wt 146.0 lb

## 2014-10-30 DIAGNOSIS — I7121 Aneurysm of the ascending aorta, without rupture: Secondary | ICD-10-CM

## 2014-10-30 DIAGNOSIS — I712 Thoracic aortic aneurysm, without rupture: Secondary | ICD-10-CM

## 2014-10-30 DIAGNOSIS — C3412 Malignant neoplasm of upper lobe, left bronchus or lung: Secondary | ICD-10-CM

## 2014-10-30 NOTE — Progress Notes (Signed)
[address]       [place] [address]             [phone]       HPI:  Haley [name] returns for a scheduled 6 month follow-up visit.  She is a 81 year old woman who has a history of a stage IA adenocarcinoma with lepidic spread resected with a left upper lobe wedge resection in February 2014. She also has an incidentally noted ascending aortic aneurysm that is 4.8 cm in diameter. Her aneurysm dates back about 10 years. It was 4.5 cm in 2011.  She says that she's been feeling well. She has not had any major respiratory issues in the last 6 months. She denies any chest pain, pressure, or tightness. Her weight is stable. She has an occasional cough, but nothing out of the ordinary. She denies hemoptysis. She denies weight loss or any unusual headaches or visual changes.  Past Medical History  Diagnosis Date  . Urinary tract infection, site not specified   . Symptomatic menopausal or female climacteric states   . Fatty liver   . Bronchitis, not specified as acute or chronic   . Hiatal hernia   . Atrophic gastritis without mention of hemorrhage   . Cardiac dysrhythmia, unspecified   . Pneumonia, organism unspecified   . Pure hypercholesterolemia   . Unspecified essential hypertension   . Cough   . Irritable bowel syndrome   . Other chest pain   . Esophageal reflux   . Chronic pancreatitis   . Flatulence, eructation, and gas pain   . Gastric erosions     [name]  . Irritable bowel syndrome   . Family history of malignant neoplasm of gastrointestinal tract   . Sorethroat     since virus 2 months ago  . Hoarseness     since virus 2 months agp  . Complication of anesthesia   . PONV (postoperative nausea and vomiting)     with hemorrhoid surgery years ago, no n/v with breast surgery  . Shortness of breath     with Exertion  . Malignant neoplasm of breast (female), unspecified site     Skin Cancer legs, face, shoulder, some basal some squamous  .  Arthritis     knees and left hip  . Cataract   . Thoracic aortic aneurysm without rupture 08/29/12    08/29/12 discovered on PET/CT  . Tubular adenoma of colon      Current Outpatient Prescriptions  Medication Sig Dispense Refill  . atorvastatin (LIPITOR) 20 MG tablet Take 1 tablet (20 mg total) by mouth daily. 90 tablet 3  . b complex vitamins tablet Take 1 tablet by mouth daily.     . Cholecalciferol (VITAMIN D3) 2000 UNITS TABS Take 1 tablet by mouth daily.      Marland Kitchen co-enzyme Q-10 30 MG capsule Take 300 mg by mouth daily.    . metoprolol succinate (TOPROL-XL) 25 MG 24 hr tablet Take 0.5 tablets (12.5 mg total) by mouth daily as needed. FOR HYPERTENSION 90 tablet 3  . verapamil (CALAN-SR) 120 MG CR tablet Take 1 tablet daily 90 tablet 3   No current facility-administered medications for this visit.    Physical Exam BP 112/66 mmHg  Pulse 59  Resp 16  Ht 5\' 6"  (1.676 m)  Wt 146 lb (66.225 kg)  BMI 23.58 kg/m2  SpO11 74% 81 year old woman appears younger than stated age Alert and oriented 3 with no focal deficits No  carotid bruits. No cervical or supraclavicular adenopathy Lungs clear with equal breath sounds bilaterally Cardiac regular rate and rhythm normal S1 and S2 2/6 systolic murmur The distal pulses intact  Diagnostic Tests: CT of the chest reviewed it shows no active disease in the lungs. There are several areas of scarring. The aneurysm is 4.8 cm and unchanged.  Impression: 81 year old woman who is now 2 years out from wedge resection of a stage IA adenocarcinoma with lepidic spread. She has no evidence of recurrent disease.  She does have a 4.8 cm ascending aneurysm. This has slowly increased in size. Going back to 2011 the aneurysm was about 4.5 cm at that time. There is no indication for surgery currently. We reviewed the indications for surgery. She does need continued close follow-up for that.  Plan: Return in 6 months with CT angiogram of chest  Melrose Nakayama, MD [facility] [phone]  I spent over 15 minutes with Haley [name] on this visit.

## 2014-11-01 ENCOUNTER — Ambulatory Visit: Payer: Self-pay | Admitting: Internal Medicine

## 2014-11-06 ENCOUNTER — Encounter: Payer: Self-pay | Admitting: Cardiovascular Disease

## 2014-11-06 ENCOUNTER — Ambulatory Visit (INDEPENDENT_AMBULATORY_CARE_PROVIDER_SITE_OTHER): Payer: Medicare Other | Admitting: Cardiovascular Disease

## 2014-11-06 VITALS — BP 146/98 | HR 51 | Ht 66.0 in | Wt 147.2 lb

## 2014-11-06 DIAGNOSIS — Z79899 Other long term (current) drug therapy: Secondary | ICD-10-CM

## 2014-11-06 DIAGNOSIS — I471 Supraventricular tachycardia: Secondary | ICD-10-CM | POA: Diagnosis not present

## 2014-11-06 DIAGNOSIS — E7800 Pure hypercholesterolemia, unspecified: Secondary | ICD-10-CM

## 2014-11-06 DIAGNOSIS — R001 Bradycardia, unspecified: Secondary | ICD-10-CM

## 2014-11-06 DIAGNOSIS — K219 Gastro-esophageal reflux disease without esophagitis: Secondary | ICD-10-CM

## 2014-11-06 DIAGNOSIS — I712 Thoracic aortic aneurysm, without rupture, unspecified: Secondary | ICD-10-CM

## 2014-11-06 DIAGNOSIS — I10 Essential (primary) hypertension: Secondary | ICD-10-CM

## 2014-11-06 DIAGNOSIS — E785 Hyperlipidemia, unspecified: Secondary | ICD-10-CM

## 2014-11-06 MED ORDER — VERAPAMIL HCL ER 120 MG PO TBCR: EXTENDED_RELEASE_TABLET | ORAL | Status: DC

## 2014-11-06 MED ORDER — SPIRONOLACTONE 25 MG PO TABS: ORAL_TABLET | ORAL | Status: DC

## 2014-11-06 NOTE — Patient Instructions (Signed)
Your physician has recommended you make the following change in your medication: start new prescription for spironalactone as directed on the bottle.  Your physician recommends that you return for lab work in:  1 -2 weeks.  Your physician wants you to follow-up in: November 2016 or sooner if needed with Dr. Claiborne Billings. You will receive a reminder letter in the mail two months in advance. If you don't receive a letter, please call our office to schedule the follow-up appointment.

## 2014-11-08 ENCOUNTER — Encounter: Payer: Self-pay | Admitting: Cardiovascular Disease

## 2014-11-08 DIAGNOSIS — E785 Hyperlipidemia, unspecified: Secondary | ICD-10-CM | POA: Insufficient documentation

## 2014-11-08 NOTE — Progress Notes (Signed)
Patient ID: Haley [name], female   DOB: 1934/03/27, 81 y.o.   MRN: [id number]     HPI: Haley [name] is a 81 y.o. female who presents to the office today for 12 month cardiology evaluation.  Ms. Binegar  has a history of superventricular tachycardia in the past has been treated with verapamil as well as beta blocker therapy. Recently, she has done well and now only takes a beta blocker on an as needed basis and admits to only rarely taking this. She has a remote history of breast cancer. She has a history of hiatal hernia with GERD, hyperlipidemia, and also evidence for descending thoracic aorta that is increased at 4.5 cm on a CT scan suggested fusiform aneurysmal dilatation. On the initial CT in 2011 she was found to have a right upper lung nodule. 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 [name]. She has had subsequent CT scan is for followup.  Her last CT angiogram of her chest was on 10/23/2014 which demonstrated a stable descending thoracic aortic aneurysm measuring 4.8 cm maximally.  Semiannual imaging follow-up I, CTA or MRI was recommended. She denies recent chest pain. She denies tachycardia palpitations. She denies PND or orthopnea. She denies bleeding.  She has undergone laboratory by her primary physician in February.  I reviewed these.  Her hemoglobin and hematocrit were stable at 12.8 and 39.4.  Chemistry profile was normal.  Chest normal renal function.  Lipids are excellent with a total cholesterol 165, triglycerides 131, HDL 67, LDL 72.  Past Medical History  Diagnosis Date  . Urinary tract infection, site not specified   . Symptomatic menopausal or female climacteric states   . Fatty liver   . Bronchitis, not specified as acute or chronic   . Hiatal hernia   . Atrophic gastritis without mention of hemorrhage   . Cardiac dysrhythmia, unspecified   . Pneumonia,  organism unspecified   . Pure hypercholesterolemia   . Unspecified essential hypertension   . Cough   . Irritable bowel syndrome   . Other chest pain   . Esophageal reflux   . Chronic pancreatitis   . Flatulence, eructation, and gas pain   . Gastric erosions     [name]  . Irritable bowel syndrome   . Family history of malignant neoplasm of gastrointestinal tract   . Sorethroat     since virus 2 months ago  . Hoarseness     since virus 2 months agp  . Complication of anesthesia   . PONV (postoperative nausea and vomiting)     with hemorrhoid surgery years ago, no n/v with breast surgery  . Shortness of breath     with Exertion  . Malignant neoplasm of breast (female), unspecified site     Skin Cancer legs, face, shoulder, some basal some squamous  . Arthritis     knees and left hip  . Cataract   . Thoracic aortic aneurysm without rupture 08/29/12    08/29/12 discovered on PET/CT  . Tubular adenoma of colon     Past Surgical History  Procedure Laterality Date  . Mastectomy, partial  2004    right   . Hemorroidectomy    . Cataract extraction      Bilateral  . Colonoscopy    . Eye surgery    . Skin cancer excision    . Video assisted thoracoscopy (vats)/wedge  resection Left 09/19/2012    Procedure: LEFT VIDEO ASSISTED THORACOSCOPY (VATS)/LEFT UPPER LOBE WEDGE RESECTION & NODE DISSECTION;  Surgeon: Melrose Nakayama, MD;  Location: [facility];  Service: Thoracic;  Laterality: Left;  . Breast surgery      No Known Allergies  Current Outpatient Prescriptions  Medication Sig Dispense Refill  . atorvastatin (LIPITOR) 20 MG tablet Take 1 tablet (20 mg total) by mouth daily. 90 tablet 3  . b complex vitamins tablet Take 1 tablet by mouth daily.     . Cholecalciferol (VITAMIN D3) 2000 UNITS TABS Take 1 tablet by mouth daily.      Marland Kitchen co-enzyme Q-10 30 MG capsule Take 300 mg by mouth daily.    . metoprolol succinate (TOPROL-XL) 25 MG 24 hr tablet Take 0.5 tablets (12.5 mg total) by  mouth daily as needed. FOR HYPERTENSION 90 tablet 3  . spironolactone (ALDACTONE) 25 MG tablet Take 1/2 tablet daily for 1 week then increase to 1 tablet daily 30 tablet 6  . verapamil (CALAN-SR) 120 MG CR tablet Take 1 tablet daily 30 tablet 3   No current facility-administered medications for this visit.    History   Social History  . Marital Status: Married    Spouse Name: N/A  . Number of Children: 2  . Years of Education: N/A   Occupational History  . Retired     Tourist information centre manager   Social History Main Topics  . Smoking status: Never Smoker   . Smokeless tobacco: Never Used  . Alcohol Use: No  . Drug Use: No  . Sexual Activity: No   Other Topics Concern  . Not on file   Social History Narrative   Married   Retired   Careers adviser          Family History  Problem Relation Age of Onset  . Colon cancer Father 21  . Cancer Father   . Stroke Father   . Diabetes Paternal Aunt   . Heart disease Mother   . Heart disease Brother   . Cancer Brother     mass behind lung-died age 39  . Multiple myeloma Brother 65    died age 71    ROS General: Negative; No fevers, chills, or night sweats;  HEENT: Negative; No changes in vision or hearing, sinus congestion, difficulty swallowing Pulmonary: Negative; No cough, wheezing, shortness of breath, hemoptysis Cardiovascular: Negative; No chest pain, presyncope, syncope, palpitations GI: Negative; No nausea, vomiting, diarrhea, or abdominal pain GU: Negative; No dysuria, hematuria, or difficulty voiding Musculoskeletal: Negative; no myalgias, joint pain, or weakness Hematologic/Oncology: Negative; no easy bruising, bleeding Endocrine: Negative; no heat/cold intolerance; no diabetes Neuro: Negative; no changes in balance, headaches Skin: Negative; No rashes or skin lesions Psychiatric: Negative; No behavioral problems, depression Sleep: Negative; No snoring, daytime sleepiness, hypersomnolence, bruxism, restless  legs, hypnogognic hallucinations, no cataplexy Other comprehensive 14 point system review is negative. PE BP 146/98 mmHg  Pulse 51  Ht '5\' 6"'  (1.676 m)  Wt 147 lb 3.2 oz (66.769 kg)  BMI 23.77 kg/m2  General: Alert, oriented, no distress.  Skin: normal turgor, no rashes HEENT: Normocephalic, atraumatic. Pupils round and reactive; sclera anicteric;no lid lag.  Nose without nasal septal hypertrophy Mouth/Parynx benign; Mallinpatti scale 2 Neck: No JVD, no carotid briuts Lungs: clear to ausculatation and percussion; no wheezing or rales Heart: RRR, s1 s2 normal 1/6 systolic murmur and 8 with a diastolic murmur in the aortic area Abdomen: soft, nontender; no hepatosplenomehaly, BS+;  abdominal aorta nontender and not dilated by palpation. Pulses 2+ Extremities: no clubbing cyanosis or edema, Homan's sign negative  Neurologic: grossly nonfocal  ECG (independently read by me): Sinus bradycardia at 51 bpm.  Poor progression V1 through V3.  QTc interval 420 ms.  September 2014 ECG: Sinus rhythm 58 beats per minute. Intervals normal.  LABS:  BMET  BMP Latest Ref Rng 09/11/2014 08/29/2013 09/21/2012  Glucose 70 - 99 mg/dL 82 83 101(H)  BUN 6 - 23 mg/dL '14 14 8  ' Creatinine 0.50 - 1.10 mg/dL 0.76 0.79 0.68  Sodium 135 - 145 mEq/L 141 140 133(L)  Potassium 3.5 - 5.3 mEq/L 4.8 4.1 3.8  Chloride 96 - 112 mEq/L 105 102 98  CO2 19 - 32 mEq/L '30 28 29  ' Calcium 8.4 - 10.5 mg/dL 9.0 9.7 8.4     Hepatic Function Panel   Hepatic Function Latest Ref Rng 09/11/2014 08/29/2013 09/21/2012  Total Protein 6.0 - 8.3 g/dL 6.6 7.4 5.9(L)  Albumin 3.5 - 5.2 g/dL 3.9 4.3 2.8(L)  AST 0 - 37 U/L '14 19 21  ' ALT 0 - 35 U/L '9 10 11  ' Alk Phosphatase 39 - 117 U/L 56 66 59  Total Bilirubin 0.2 - 1.2 mg/dL 1.1 1.1 0.7     CBC  CBC Latest Ref Rng 09/11/2014 08/29/2013 09/21/2012  WBC 4.0 - 10.5 K/uL 4.7 5.8 8.7  Hemoglobin 12.0 - 15.0 g/dL 12.8 13.0 10.7(L)  Hematocrit 36.0 - 46.0 % 39.4 38.9 30.6(L)  Platelets 150  - 400 K/uL 294 332 217   Lab Results  Component Value Date   TSH 4.002 07/05/2012    BNP No results found for: PROBNP  Lipid Panel     Component Value Date/Time   CHOL 165 09/11/2014 1556   TRIG 131 09/11/2014 1556   HDL 67 09/11/2014 1556   CHOLHDL 2.5 09/11/2014 1556   VLDL 26 09/11/2014 1556   LDLCALC 72 09/11/2014 1556     RADIOLOGY: No results found.    ASSESSMENT AND PLAN: Ms. Manninen is a 81 year old female who continues to do well from a cardiovascular standpoint. She has a history of paroxysmal supraventricular tachycardia which has been treated with verapamil and now only rarely takes when necessary metoprolol for breakthrough palpitations.  He denies recent breakthrough tachycardia palpitations.  She has sinus bradycardia with her resting pulse at 51 bpm, currently on verapamil 120 mg daily.  Her blood pressure today was elevated and when taken by the nurse was 146/98.  Particular with her thoracic aneurysm, I am recommending further aggressive treatment of her blood pressure.  I'm starting her on spironolactone 12.5 mg to take for the next 2 weeks and then she will titrate this to 25 mg daily.  8 been met will be obtained in 3 weeks to make certain she is tolerating this from a renal and potassium standpoint.  She is on atorvastatin 20 mg for hyperlipidemia, and most recent lipid studies were excellent.  She will be undergoing follow-up CT imaging for thoracic aneurysm and she is followed by [name] , both for aneurysm and her remote history of adenocarcinoma of her long.  She is not having any GERD symptoms and is no longer taking Nexium.  She denies any anginal symptoms.  I will see her in in November for follow-up evaluation.  Further recommendations will be made at that time.    Troy Sine, MD, [facility]  11/08/2014 7:46 AM

## 2014-11-15 ENCOUNTER — Encounter: Payer: Self-pay | Admitting: Gynecology

## 2014-11-15 ENCOUNTER — Ambulatory Visit (INDEPENDENT_AMBULATORY_CARE_PROVIDER_SITE_OTHER): Payer: Medicare Other | Admitting: Gynecology

## 2014-11-15 VITALS — BP 124/74 | Ht 66.0 in | Wt 145.0 lb

## 2014-11-15 DIAGNOSIS — N898 Other specified noninflammatory disorders of vagina: Secondary | ICD-10-CM | POA: Diagnosis not present

## 2014-11-15 DIAGNOSIS — IMO0002 Reserved for concepts with insufficient information to code with codable children: Secondary | ICD-10-CM

## 2014-11-15 HISTORY — DX: Reserved for concepts with insufficient information to code with codable children: IMO0002

## 2014-11-15 NOTE — Progress Notes (Signed)
Haley [name] [birth year] [id number]        81 y.o.  G2P2 New patient referred in consultation from Dr. Everlene Farrier from [facility] urgent care secondary to a vaginal nodule being noted on routine exam. She has a history of breast cancer and lung cancer and Dr. Everlene Farrier felt more comfortable with a GYN evaluation. He performed a complete exam to include a normal Pap smear and breast exam.  Past medical history,surgical history, problem list, medications, allergies, family history and social history were all reviewed and documented in the EPIC chart.  Directed ROS with pertinent positives and negatives documented in the history of present illness/assessment and plan.  Exam: Kim assistant Filed Vitals:   11/15/14 1418  BP: 124/74  Height: '5\' 6"'$  (1.676 m)  Weight: 145 lb (65.772 kg)   General appearance:  Normal Abdomen soft nontender without masses guarding rebound Pelvic external BUS vagina with atrophic changes. Small submucosal nodule noted upper lateral left vaginal sidewall. Cervix grossly normal and atrophic. Uterus normal size midline mobile nontender. Adnexa without masses or tenderness. Rectovaginal exam is normal.  Colposcopy performed, acetic acid cleanse with no cervical abnormalities noted. Exam is inadequate as transformation zone was not completely seen. Small vaginal nodule with normal-appearing overlying mucosa with no abnormal vascular or acetowhite changes.  Procedure: The mucosa underlying the nodule was infiltrated using 1% Xylocaine and the nodule was completely biopsied off at the level of the surrounding mucosa. Monsel's solution was applied for hemostasis. Patient tolerated well.  Assessment/Plan:  81 y.o. G2P2 with small upper left lateral vaginal wall nodule. Appears benign and probably secondary to old trauma from delivery. Had not had a GYN exam in a number of years preceding this. Given her cancer history 2 felt most prudent to remove this as it is not easily examined. Patient  will follow up for the biopsy results. Assuming negative then she'll continue to see Dr. Everlene Farrier routinely for her health care.    Anastasio Auerbach MD, 4:25 PM 11/15/2014

## 2014-11-15 NOTE — Patient Instructions (Signed)
Office will call you with biopsy results 

## 2014-11-19 ENCOUNTER — Encounter: Payer: Self-pay | Admitting: Internal Medicine

## 2014-11-19 ENCOUNTER — Ambulatory Visit (INDEPENDENT_AMBULATORY_CARE_PROVIDER_SITE_OTHER): Payer: Medicare Other | Admitting: Internal Medicine

## 2014-11-19 VITALS — BP 146/80 | HR 56 | Ht 66.0 in | Wt 145.1 lb

## 2014-11-19 DIAGNOSIS — K219 Gastro-esophageal reflux disease without esophagitis: Secondary | ICD-10-CM | POA: Diagnosis not present

## 2014-11-19 DIAGNOSIS — K589 Irritable bowel syndrome without diarrhea: Secondary | ICD-10-CM

## 2014-11-19 DIAGNOSIS — R140 Abdominal distension (gaseous): Secondary | ICD-10-CM | POA: Diagnosis not present

## 2014-11-19 DIAGNOSIS — Z86010 Personal history of colonic polyps: Secondary | ICD-10-CM | POA: Diagnosis not present

## 2014-11-19 NOTE — Patient Instructions (Signed)
Please take Zantac or Pepcid over the counter as needed for heartburn.  You may purchase Align or Florastor to take daily for gas/bloating (this can be purchased OTC).  Please follow an antireflux diet (see information below).  Follow up with Dr Hilarie Fredrickson as needed.  Gastroesophageal Reflux Disease, Adult Gastroesophageal reflux disease (GERD) happens when acid from your stomach flows up into the esophagus. When acid comes in contact with the esophagus, the acid causes soreness (inflammation) in the esophagus. Over time, GERD may create small holes (ulcers) in the lining of the esophagus. CAUSES   Increased body weight. This puts pressure on the stomach, making acid rise from the stomach into the esophagus.  Smoking. This increases acid production in the stomach.  Drinking alcohol. This causes decreased pressure in the lower esophageal sphincter (valve or ring of muscle between the esophagus and stomach), allowing acid from the stomach into the esophagus.  Late evening meals and a full stomach. This increases pressure and acid production in the stomach.  A malformed lower esophageal sphincter. Sometimes, no cause is found. SYMPTOMS   Burning pain in the lower part of the mid-chest behind the breastbone and in the mid-stomach area. This may occur twice a week or more often.  Trouble swallowing.  Sore throat.  Dry cough.  Asthma-like symptoms including chest tightness, shortness of breath, or wheezing. DIAGNOSIS  Your caregiver may be able to diagnose GERD based on your symptoms. In some cases, X-rays and other tests may be done to check for complications or to check the condition of your stomach and esophagus. TREATMENT  Your caregiver may recommend over-the-counter or prescription medicines to help decrease acid production. Ask your caregiver before starting or adding any new medicines.  HOME CARE INSTRUCTIONS   Change the factors that you can control. Ask your caregiver for  guidance concerning weight loss, quitting smoking, and alcohol consumption.  Avoid foods and drinks that make your symptoms worse, such as:  Caffeine or alcoholic drinks.  Chocolate.  Peppermint or mint flavorings.  Garlic and onions.  Spicy foods.  Citrus fruits, such as oranges, lemons, or limes.  Tomato-based foods such as sauce, chili, salsa, and pizza.  Fried and fatty foods.  Avoid lying down for the 3 hours prior to your bedtime or prior to taking a nap.  Eat small, frequent meals instead of large meals.  Wear loose-fitting clothing. Do not wear anything tight around your waist that causes pressure on your stomach.  Raise the head of your bed 6 to 8 inches with wood blocks to help you sleep. Extra pillows will not help.  Only take over-the-counter or prescription medicines for pain, discomfort, or fever as directed by your caregiver.  Do not take aspirin, ibuprofen, or other nonsteroidal anti-inflammatory drugs (NSAIDs). SEEK IMMEDIATE MEDICAL CARE IF:   You have pain in your arms, neck, jaw, teeth, or back.  Your pain increases or changes in intensity or duration.  You develop nausea, vomiting, or sweating (diaphoresis).  You develop shortness of breath, or you faint.  Your vomit is green, yellow, black, or looks like coffee grounds or blood.  Your stool is red, bloody, or black. These symptoms could be signs of other problems, such as heart disease, gastric bleeding, or esophageal bleeding. MAKE SURE YOU:   Understand these instructions.  Will watch your condition.  Will get help right away if you are not doing well or get worse. Document Released: 04/22/2005 Document Revised: 10/05/2011 Document Reviewed: 01/30/2011 [employer] Patient Information 2015  [employer]. This information is not intended to replace advice given to you by your health care provider. Make sure you discuss any questions you have with your health care provider.   Food Choices  for Gastroesophageal Reflux Disease When you have gastroesophageal reflux disease (GERD), the foods you eat and your eating habits are very important. Choosing the right foods can help ease the discomfort of GERD. WHAT GENERAL GUIDELINES DO I NEED TO FOLLOW?  Choose fruits, vegetables, whole grains, low-fat dairy products, and low-fat meat, fish, and poultry.  Limit fats such as oils, salad dressings, butter, nuts, and avocado.  Keep a food diary to identify foods that cause symptoms.  Avoid foods that cause reflux. These may be different for different people.  Eat frequent small meals instead of three large meals each day.  Eat your meals slowly, in a relaxed setting.  Limit fried foods.  Cook foods using methods other than frying.  Avoid drinking alcohol.  Avoid drinking large amounts of liquids with your meals.  Avoid bending over or lying down until 2-3 hours after eating. WHAT FOODS ARE NOT RECOMMENDED? The following are some foods and drinks that may worsen your symptoms: Vegetables Tomatoes. Tomato juice. Tomato and spaghetti sauce. Chili peppers. Onion and garlic. Horseradish. Fruits Oranges, grapefruit, and lemon (fruit and juice). Meats High-fat meats, fish, and poultry. This includes hot dogs, ribs, ham, sausage, salami, and bacon. Dairy Whole milk and chocolate milk. Sour cream. Cream. Butter. Ice cream. Cream cheese.  Beverages Coffee and tea, with or without caffeine. Carbonated beverages or energy drinks. Condiments Hot sauce. Barbecue sauce.  Sweets/Desserts Chocolate and cocoa. Donuts. Peppermint and spearmint. Fats and Oils High-fat foods, including [place] fries and potato chips. Other Vinegar. Strong spices, such as black pepper, Maneri pepper, red pepper, cayenne, curry powder, cloves, ginger, and chili powder. The items listed above may not be a complete list of foods and beverages to avoid. Contact your dietitian for more information. Document  Released: 07/13/2005 Document Revised: 07/18/2013 Document Reviewed: 05/17/2013 [facility] Patient Information 2015 [place], [place]. This information is not intended to replace advice given to you by your health care provider. Make sure you discuss any questions you have with your health care provider.

## 2014-11-19 NOTE — Progress Notes (Signed)
Patient ID: Haley [name], female   DOB: [birth year], 81 y.o.   MRN: [id number] HPI: Haley [name] is a 81 yo female with PMH of chronic GERD, esophageal dysmotility, colonic polyps, adenocarcinoma of the lung status post resection, breast cancer in remission, IBS, who seen in follow-up. She was last seen by Dr. Sharlett Iles in 2014. She reports since this time she has weaned herself off of Nexium. She did this because she read about side effects including bone loss and possible memory loss. She's been off for 8-9 months and has been doing very well. She is following a GERD diet and also eating smaller more frequent meals. Elevating the head of her bed. She is not having much trouble with heartburn. She reports normal swallowing and denies dysphagia or odynophagia. Denies abdominal pain. Bowel movements are occasionally loose but not diarrhea. She has issues with intermittent gas and bloating. She's tried probiotics in the past which have helped. She was recently started on spironolactone for management of hypertension and feels like this may have resulted in mild loose stools. She denies blood in her stool or melena. No weight loss or early satiety.  Past Medical History  Diagnosis Date  . Urinary tract infection, site not specified   . Symptomatic menopausal or female climacteric states   . Fatty liver   . Bronchitis, not specified as acute or chronic   . Hiatal hernia   . Atrophic gastritis without mention of hemorrhage   . Cardiac dysrhythmia, unspecified   . Pneumonia, organism unspecified   . Pure hypercholesterolemia   . Unspecified essential hypertension   . Cough   . Irritable bowel syndrome   . Other chest pain   . Esophageal reflux   . Chronic pancreatitis   . Flatulence, eructation, and gas pain   . Gastric erosions     [name]  . Irritable bowel syndrome   . Family history of malignant neoplasm of gastrointestinal tract   . Sorethroat     since virus 2 months ago  . Hoarseness      since virus 2 months agp  . Complication of anesthesia   . PONV (postoperative nausea and vomiting)     with hemorrhoid surgery years ago, no n/v with breast surgery  . Shortness of breath     with Exertion  . Arthritis     knees and left hip  . Cataract   . Thoracic aortic aneurysm without rupture 08/29/12    08/29/12 discovered on PET/CT  . Tubular adenoma of colon   . Malignant neoplasm of breast (female), unspecified site     Skin Cancer legs, face, shoulder, some basal some squamous  . Cancer     Lung cancer  . LGSIL (low grade squamous intraepithelial dysplasia) 11/15/2014    with biopsy of  benign vaginal cyst    Past Surgical History  Procedure Laterality Date  . Mastectomy, partial  2004    right   . Hemorroidectomy    . Cataract extraction      Bilateral  . Colonoscopy    . Eye surgery    . Skin cancer excision    . Video assisted thoracoscopy (vats)/wedge resection Left 09/19/2012    Procedure: LEFT VIDEO ASSISTED THORACOSCOPY (VATS)/LEFT UPPER LOBE WEDGE RESECTION & NODE DISSECTION;  Surgeon: Melrose Nakayama, MD;  Location: ;  Service: Thoracic;  Laterality: Left;  . Breast surgery      Outpatient Prescriptions Prior to Visit  Medication Sig Dispense Refill  .  atorvastatin (LIPITOR) 20 MG tablet Take 1 tablet (20 mg total) by mouth daily. 90 tablet 3  . b complex vitamins tablet Take 1 tablet by mouth daily.     . Cholecalciferol (VITAMIN D3) 2000 UNITS TABS Take 1 tablet by mouth daily.      Marland Kitchen co-enzyme Q-10 30 MG capsule Take 300 mg by mouth daily.    . metoprolol succinate (TOPROL-XL) 25 MG 24 hr tablet Take 0.5 tablets (12.5 mg total) by mouth daily as needed. FOR HYPERTENSION 90 tablet 3  . spironolactone (ALDACTONE) 25 MG tablet Take 1/2 tablet daily for 1 week then increase to 1 tablet daily 30 tablet 6  . verapamil (CALAN-SR) 120 MG CR tablet Take 1 tablet daily 30 tablet 3   No facility-administered medications prior to visit.    No Known  Allergies  Family History  Problem Relation Age of Onset  . Colon cancer Father 36  . Cancer Father   . Stroke Father   . Diabetes Paternal Aunt   . Heart disease Mother   . Heart disease Brother   . Cancer Brother     mass behind lung-died age 27  . Multiple myeloma Brother 61    died age 14    History  Substance Use Topics  . Smoking status: Never Smoker   . Smokeless tobacco: Never Used  . Alcohol Use: No    ROS: As per history of present illness, otherwise negative  BP 146/80 mmHg  Pulse 56  Ht '5\' 6"'  (1.676 m)  Wt 145 lb 2 oz (65.828 kg)  BMI 23.43 kg/m2 Constitutional: Well-developed and well-nourished. No distress. HEENT: Normocephalic and atraumatic. Oropharynx is clear and moist. No oropharyngeal exudate. Conjunctivae are normal.  No scleral icterus. Neck: Neck supple. Trachea midline. Cardiovascular: Normal rate, regular rhythm and intact distal pulses. 2/6 sem Pulmonary/chest: Effort normal and breath sounds normal. No wheezing, rales or rhonchi. Abdominal: Soft, nontender, nondistended. Bowel sounds active throughout. There are no masses palpable. No hepatosplenomegaly. Extremities: no clubbing, cyanosis, or edema Lymphadenopathy: No cervical adenopathy noted. Neurological: Alert and oriented to person place and time. Skin: Skin is warm and dry. No rashes noted. Psychiatric: Normal mood and affect. Behavior is normal.  RELEVANT LABS AND IMAGING: CBC    Component Value Date/Time   WBC 4.7 09/11/2014 1556   WBC 5.6 07/05/2012 1526   WBC 4.9 04/23/2011 1403   RBC 4.29 09/11/2014 1556   RBC 4.34 07/05/2012 1526   RBC 3.78 04/23/2011 1403   HGB 12.8 09/11/2014 1556   HGB 12.6 07/05/2012 1526   HGB 11.9 04/23/2011 1403   HCT 39.4 09/11/2014 1556   HCT 42.1 07/05/2012 1526   HCT 34.5* 04/23/2011 1403   PLT 294 09/11/2014 1556   PLT 273 04/23/2011 1403   MCV 91.8 09/11/2014 1556   MCV 97.0 07/05/2012 1526   MCV 91.2 04/23/2011 1403   MCH 29.8  09/11/2014 1556   MCH 29.0 07/05/2012 1526   MCH 31.5 04/23/2011 1403   MCHC 32.5 09/11/2014 1556   MCHC 29.9* 07/05/2012 1526   MCHC 34.5 04/23/2011 1403   RDW 14.3 09/11/2014 1556   RDW 13.6 04/23/2011 1403   LYMPHSABS 1.9 09/11/2014 1556   LYMPHSABS 2.1 04/23/2011 1403   MONOABS 0.5 09/11/2014 1556   MONOABS 0.4 04/23/2011 1403   EOSABS 0.0 09/11/2014 1556   EOSABS 0.0 04/23/2011 1403   BASOSABS 0.0 09/11/2014 1556   BASOSABS 0.0 04/23/2011 1403    CMP     Component  Value Date/Time   NA 141 09/11/2014 1556   K 4.8 09/11/2014 1556   CL 105 09/11/2014 1556   CO2 30 09/11/2014 1556   GLUCOSE 82 09/11/2014 1556   BUN 14 09/11/2014 1556   CREATININE 0.76 09/11/2014 1556   CREATININE 0.68 09/21/2012 0409   CALCIUM 9.0 09/11/2014 1556   PROT 6.6 09/11/2014 1556   ALBUMIN 3.9 09/11/2014 1556   AST 14 09/11/2014 1556   ALT 9 09/11/2014 1556   ALKPHOS 56 09/11/2014 1556   BILITOT 1.1 09/11/2014 1556   GFRNONAA 75 09/11/2014 1556   GFRNONAA 83* 09/21/2012 0409   GFRAA 87 09/11/2014 1556   GFRAA >90 09/21/2012 0409   Prior procedures reviewed including EGD 01/18/2012. Normal EGD. Biopsies negative for H. Pylori.  Colonoscopy performed same day with a 5 mm cecal polyp removed found to be tubular adenoma. Otherwise normal colonoscopy.  ASSESSMENT/PLAN: 81 yo female with PMH of chronic GERD, esophageal dysmotility, colonic polyps, adenocarcinoma of the lung status post resection, breast cancer in remission, IBS, who seen in follow-up.  1. GERD -- symptoms have responded nicely to dietary modification and lifestyle modification. She is weaned off PPI and is doing well without significant heartburn and certainly no alarm symptoms today. I recommended as needed Zantac or Pepcid per box instruction for occasional heartburn. If she develops recurrent heartburn on a frequent basis or any issues with swallowing I asked that she notify me. She voices understanding  2. Gas and bloating --  likely IBS and chronic for her. I recommended align or Florastor over-the-counter as a probiotic.  3.  History of adenomatous colon polyps -- repeat colonoscopy due June 2018. We'll discuss at that time as she will be nearly 81 years old.  Return as needed   XL:LIYIYU A Daub, [place], [place] [address]

## 2014-12-01 LAB — BASIC METABOLIC PANEL
BUN: 16 mg/dL (ref 6–23)
CHLORIDE: 104 meq/L (ref 96–112)
CO2: 30 mEq/L (ref 19–32)
CREATININE: 0.77 mg/dL (ref 0.50–1.10)
Calcium: 9 mg/dL (ref 8.4–10.5)
Glucose, Bld: 90 mg/dL (ref 70–99)
Potassium: 4.4 mEq/L (ref 3.5–5.3)
SODIUM: 138 meq/L (ref 135–145)

## 2014-12-06 ENCOUNTER — Telehealth: Payer: Self-pay | Admitting: Cardiovascular Disease

## 2014-12-06 NOTE — Telephone Encounter (Signed)
Pt called in stating that she had some blood work done on 5/6 and she would like to know if those results were available . Please call back  Thanks

## 2014-12-06 NOTE — Telephone Encounter (Signed)
Left message on machine requesting patient call back.

## 2014-12-06 NOTE — Telephone Encounter (Signed)
Discussed results w/ patient, mailed copy of recent labs.

## 2014-12-11 ENCOUNTER — Other Ambulatory Visit: Payer: Self-pay | Admitting: *Deleted

## 2014-12-11 MED ORDER — SPIRONOLACTONE 25 MG PO TABS: ORAL_TABLET | ORAL | Status: DC

## 2014-12-13 ENCOUNTER — Other Ambulatory Visit: Payer: Self-pay | Admitting: *Deleted

## 2014-12-13 MED ORDER — SPIRONOLACTONE 25 MG PO TABS: ORAL_TABLET | ORAL | Status: DC

## 2015-01-14 ENCOUNTER — Other Ambulatory Visit: Payer: Self-pay

## 2015-01-14 DIAGNOSIS — Z1231 Encounter for screening mammogram for malignant neoplasm of breast: Secondary | ICD-10-CM

## 2015-01-14 DIAGNOSIS — Z9889 Other specified postprocedural states: Secondary | ICD-10-CM

## 2015-02-21 ENCOUNTER — Ambulatory Visit
Admission: RE | Admit: 2015-02-21 | Discharge: 2015-02-21 | Disposition: A | Discharge status: Home / Self Care | Payer: Medicare Other | Source: Ambulatory Visit

## 2015-02-21 DIAGNOSIS — Z9889 Other specified postprocedural states: Secondary | ICD-10-CM

## 2015-02-21 DIAGNOSIS — Z1231 Encounter for screening mammogram for malignant neoplasm of breast: Secondary | ICD-10-CM

## 2015-03-19 ENCOUNTER — Ambulatory Visit (INDEPENDENT_AMBULATORY_CARE_PROVIDER_SITE_OTHER): Payer: Medicare Other | Admitting: Emergency Medicine

## 2015-03-19 ENCOUNTER — Encounter: Payer: Self-pay | Admitting: Emergency Medicine

## 2015-03-19 VITALS — BP 133/79 | HR 55 | Temp 98.0°F | Resp 16 | Ht 66.0 in | Wt 142.8 lb

## 2015-03-19 DIAGNOSIS — I471 Supraventricular tachycardia: Secondary | ICD-10-CM

## 2015-03-19 DIAGNOSIS — I10 Essential (primary) hypertension: Secondary | ICD-10-CM | POA: Diagnosis not present

## 2015-03-19 DIAGNOSIS — Z23 Encounter for immunization: Secondary | ICD-10-CM | POA: Diagnosis not present

## 2015-03-19 NOTE — Progress Notes (Signed)
This chart was scribed for Haley Queen, MD by Leandra Kern, Medical Scribe. This patient was seen in Room 24 and the patient's care was started at 1:17 PM.  Chief Complaint:  Chief Complaint  Patient presents with  . Hypertension  . Hyperlipidemia  . Gastrophageal Reflux    HPI: Haley [name] is a 81 y.o. female with a history of HTN, HLD, and GERD who reports to [facility] today for a 6 month follow up.  Pt notes that she has been doing well. Pt does report having a pinched nerve in her back for which she saw a chiropractor for and have found relief for the symptoms.   Pt indicates that she was prescribed Pepcid for her GERD symptoms.  Pt indicates that taking the spironolactone caused her to experience side effects, therefore she stopped taking it. She reports that her blood pressure usually goes down when she is active. She does indicate that her blood pressure has been controlled.   Pt was never a smoker. She reports that her husband was not either. Pt has a history of breast cancer. She indicates that she is UTD with her mammograms. She notes that she has a family history of cancer (2 brothers). Pt does not take baby aspirin daily due to her GERD symptoms.   Pt states that her heart has been having regular rhythm, she does indicates that it used to be in irregular rhythm frequently, however she has not experienced symptoms of that during the past few months.   Pt does not recall when she last had a tetanus shot. She is interested in getting the Shingles vaccine.  Pt reports having a life line screening in 12/2014.   Past Medical History  Diagnosis Date  . Urinary tract infection, site not specified   . Symptomatic menopausal or female climacteric states   . Fatty liver   . Bronchitis, not specified as acute or chronic   . Hiatal hernia   . Atrophic gastritis without mention of hemorrhage   . Cardiac dysrhythmia, unspecified   . Pneumonia, organism unspecified   . Pure  hypercholesterolemia   . Unspecified essential hypertension   . Cough   . Irritable bowel syndrome   . Other chest pain   . Esophageal reflux   . Chronic pancreatitis   . Flatulence, eructation, and gas pain   . Gastric erosions     Haley [name]  . Irritable bowel syndrome   . Family history of malignant neoplasm of gastrointestinal tract   . Sorethroat     since virus 2 months ago  . Hoarseness     since virus 2 months agp  . Complication of anesthesia   . PONV (postoperative nausea and vomiting)     with hemorrhoid surgery years ago, no n/v with breast surgery  . Shortness of breath     with Exertion  . Arthritis     knees and left hip  . Cataract   . Thoracic aortic aneurysm without rupture 08/29/12    08/29/12 discovered on PET/CT  . Tubular adenoma of colon   . Malignant neoplasm of breast (female), unspecified site     Skin Cancer legs, face, shoulder, some basal some squamous  . Cancer     Lung cancer  . LGSIL (low grade squamous intraepithelial dysplasia) 11/15/2014    with biopsy of  benign vaginal cyst   Past Surgical History  Procedure Laterality Date  . Mastectomy, partial  2004    right   .  Hemorroidectomy    . Cataract extraction      Bilateral  . Colonoscopy    . Eye surgery    . Skin cancer excision    . Video assisted thoracoscopy (vats)/wedge resection Left 09/19/2012    Procedure: LEFT VIDEO ASSISTED THORACOSCOPY (VATS)/LEFT UPPER LOBE WEDGE RESECTION & NODE DISSECTION;  Surgeon: Melrose Nakayama, MD;  Location: [facility];  Service: Thoracic;  Laterality: Left;  . Breast surgery     Social History   Social History  . Marital Status: Married    Spouse Name: N/A  . Number of Children: 2  . Years of Education: N/A   Occupational History  . Retired     Tourist information centre manager   Social History Main Topics  . Smoking status: Never Smoker   . Smokeless tobacco: Never Used  . Alcohol Use: No  . Drug Use: No  . Sexual Activity: No     Comment: 1st intercourse 6 yo-1  partner   Other Topics Concern  . None   Social History Narrative   Married   Retired   Careers adviser         Family History  Problem Relation Age of Onset  . Colon cancer Father 32  . Cancer Father   . Stroke Father   . Diabetes Paternal Aunt   . Heart disease Mother   . Heart disease Brother   . Cancer Brother     mass behind lung-died age 74  . Multiple myeloma Brother 62    died age 18   No Known Allergies Prior to Admission medications   Medication Sig Start Date End Date Taking? Authorizing Provider  atorvastatin (LIPITOR) 20 MG tablet Take 1 tablet (20 mg total) by mouth daily. 10/01/14  Yes Darlyne Russian, MD  b complex vitamins tablet Take 1 tablet by mouth daily.    Yes Historical Provider, MD  Cholecalciferol (VITAMIN D3) 2000 UNITS TABS Take 1 tablet by mouth daily.     Yes Historical Provider, MD  co-enzyme Q-10 30 MG capsule Take 300 mg by mouth daily.   Yes Historical Provider, MD  metoprolol succinate (TOPROL-XL) 25 MG 24 hr tablet Take 0.5 tablets (12.5 mg total) by mouth daily as needed. FOR HYPERTENSION 09/11/14  Yes Darlyne Russian, MD  verapamil (CALAN-SR) 120 MG CR tablet Take 1 tablet daily 11/06/14  Yes Troy Sine, MD  spironolactone (ALDACTONE) 25 MG tablet Take 1/2 tablet daily for 1 week then increase to 1 tablet daily Patient not taking: Reported on 03/19/2015 12/13/14   Troy Sine, MD     ROS: The patient denies fevers, chills, night sweats, unintentional weight loss, chest pain, palpitations, wheezing, dyspnea on exertion, nausea, vomiting, abdominal pain, dysuria, hematuria, melena, numbness, weakness, or tingling.  All other systems have been reviewed and were otherwise negative with the exception of those mentioned in the HPI and as above.    PHYSICAL EXAM: Filed Vitals:   03/19/15 1312  BP: 133/79  Pulse: 55  Temp: 98 F (36.7 C)  Resp: 16   Body mass index is 23.06 kg/(m^2).   General: Alert, no acute  distress HEENT:  Normocephalic, atraumatic, oropharynx patent. Eye: Juliette Mangle [facility] Cardiovascular: Irregular rhythm. Regular rate. No rubs murmurs or gallops.  No Carotid bruits, radial pulse intact. No pedal edema.  Respiratory: Clear to auscultation bilaterally.  No wheezes, rales, or rhonchi.  No cyanosis, no use of accessory musculature Abdominal: No organomegaly, abdomen is soft and non-tender,  positive bowel sounds.  No masses. Musculoskeletal: Gait intact. No edema, tenderness Skin: No rashes. Neurologic: Facial musculature symmetric. Psychiatric: Patient acts appropriately throughout our interaction. Lymphatic: No cervical or submandibular lymphadenopathy Genitourinary/Anorectal: No acute findings    LABS: Results for orders placed or performed in visit on 65/79/03  Basic metabolic panel  Result Value Ref Range   Sodium 138 135 - 145 mEq/L   Potassium 4.4 3.5 - 5.3 mEq/L   Chloride 104 96 - 112 mEq/L   CO2 30 19 - 32 mEq/L   Glucose, Bld 90 70 - 99 mg/dL   BUN 16 6 - 23 mg/dL   Creat 0.77 0.50 - 1.10 mg/dL   Calcium 9.0 8.4 - 10.5 mg/dL     EKG/XRAY:   Primary read interpreted by Dr. [facility]lene Farrier at [facility]. There is a sinus arrhythmia no acute change ASSESSMENT/PLAN: Patient is doing well. There will be no changes in her medications. Her GI symptoms have improved. She has had no episodes of tachycardia and feels well. She will continue her follow-up visits with her cardiologist her GI specialist as well as [name] her cardiothoracic surgeon.  Gross sideeffects, risk and benefits, and alternatives of medications d/w patient. Patient is aware that all medications have potential sideeffects and we are unable to predict every sideeffect or drug-drug interaction that may occur.  Haley Queen MD 03/19/2015 1:13 PM

## 2015-04-30 ENCOUNTER — Encounter: Payer: Self-pay | Admitting: Emergency Medicine

## 2015-05-09 ENCOUNTER — Other Ambulatory Visit: Payer: Self-pay | Admitting: Thoracic Surgery (Cardiothoracic Vascular Surgery)

## 2015-05-09 DIAGNOSIS — I7121 Aneurysm of the ascending aorta, without rupture: Secondary | ICD-10-CM

## 2015-05-09 DIAGNOSIS — I712 Thoracic aortic aneurysm, without rupture: Secondary | ICD-10-CM

## 2015-05-20 ENCOUNTER — Other Ambulatory Visit: Payer: Self-pay | Admitting: Thoracic Surgery (Cardiothoracic Vascular Surgery)

## 2015-05-20 LAB — CREATININE, ISTAT: Creatinine, IStat: 0.9 mg/dL (ref 0.6–1.3)

## 2015-05-21 ENCOUNTER — Ambulatory Visit
Admission: RE | Admit: 2015-05-21 | Discharge: 2015-05-21 | Disposition: A | Discharge status: Home / Self Care | Payer: Medicare Other | Source: Ambulatory Visit | Attending: Thoracic Surgery (Cardiothoracic Vascular Surgery) | Admitting: Thoracic Surgery (Cardiothoracic Vascular Surgery)

## 2015-05-21 ENCOUNTER — Ambulatory Visit (INDEPENDENT_AMBULATORY_CARE_PROVIDER_SITE_OTHER): Payer: Medicare Other | Admitting: Thoracic Surgery (Cardiothoracic Vascular Surgery)

## 2015-05-21 ENCOUNTER — Encounter: Payer: Self-pay | Admitting: Thoracic Surgery (Cardiothoracic Vascular Surgery)

## 2015-05-21 VITALS — BP 150/80 | HR 55 | Resp 20 | Ht 66.0 in | Wt 142.0 lb

## 2015-05-21 DIAGNOSIS — I712 Thoracic aortic aneurysm, without rupture: Secondary | ICD-10-CM

## 2015-05-21 DIAGNOSIS — C3412 Malignant neoplasm of upper lobe, left bronchus or lung: Secondary | ICD-10-CM

## 2015-05-21 DIAGNOSIS — R911 Solitary pulmonary nodule: Secondary | ICD-10-CM

## 2015-05-21 DIAGNOSIS — I7121 Aneurysm of the ascending aorta, without rupture: Secondary | ICD-10-CM

## 2015-05-21 MED ORDER — IOPAMIDOL (ISOVUE-370) INJECTION 76%
75.0000 mL | Freq: Once | INTRAVENOUS | Status: AC | PRN
  Administered 2015-05-21: 75 mL via INTRAVENOUS

## 2015-05-21 NOTE — Progress Notes (Signed)
[address]       [place] [address]             [phone]       HPI: [name] returns for a scheduled follow up visit.  She is a 81 year old woman who had a wedge resection of the left upper lobe for a stage IA adenocarcinoma with lepidic spread in February 2014. She also has an ascending aortic aneurysm that is 4.8 cm in diameter. Her aneurysm dates back about 10 years. It was 4.5 cm in 2011.  She has been feeling well. She has not had any major respiratory issues in the last 6 months. She denies any chest pain, pressure, or tightness. She has an occasional cough, no hemoptysis. Her appetite is good and she denies weight loss. No unusual headaches or visual changes.  Past Medical History  Diagnosis Date  . Urinary tract infection, site not specified   . Symptomatic menopausal or female climacteric states   . Fatty liver   . Bronchitis, not specified as acute or chronic   . Hiatal hernia   . Atrophic gastritis without mention of hemorrhage   . Cardiac dysrhythmia, unspecified   . Pneumonia, organism unspecified   . Pure hypercholesterolemia   . Unspecified essential hypertension   . Cough   . Irritable bowel syndrome   . Other chest pain   . Esophageal reflux   . Chronic pancreatitis ([facility])   . Flatulence, eructation, and gas pain   . Gastric erosions     [name]  . Irritable bowel syndrome   . Family history of malignant neoplasm of gastrointestinal tract   . Sorethroat     since virus 2 months ago  . Hoarseness     since virus 2 months agp  . Complication of anesthesia   . PONV (postoperative nausea and vomiting)     with hemorrhoid surgery years ago, no n/v with breast surgery  . Shortness of breath     with Exertion  . Arthritis     knees and left hip  . Cataract   . Thoracic aortic aneurysm without rupture ([facility]) 08/29/12    08/29/12 discovered on PET/CT  . Tubular adenoma of colon   . Malignant neoplasm of breast (female), unspecified  site     Skin Cancer legs, face, shoulder, some basal some squamous  . Cancer [facility])     Lung cancer  . LGSIL (low grade squamous intraepithelial dysplasia) 11/15/2014    with biopsy of  benign vaginal cyst      Current Outpatient Prescriptions  Medication Sig Dispense Refill  . atorvastatin (LIPITOR) 20 MG tablet Take 1 tablet (20 mg total) by mouth daily. 90 tablet 3  . b complex vitamins tablet Take 1 tablet by mouth daily.     . Cholecalciferol (VITAMIN D3) 2000 UNITS TABS Take 1 tablet by mouth daily.      Marland Kitchen co-enzyme Q-10 30 MG capsule Take 300 mg by mouth daily.    . metoprolol succinate (TOPROL-XL) 25 MG 24 hr tablet Take 0.5 tablets (12.5 mg total) by mouth daily as needed. FOR HYPERTENSION 90 tablet 3  . verapamil (CALAN-SR) 120 MG CR tablet Take 1 tablet daily 30 tablet 3   No current facility-administered medications for this visit.    Physical Exam BP 150/80 mmHg  Pulse 55  Resp 20  Ht '5\' 6"'$  (1.676 m)  Wt 142 lb (64.411 kg)  BMI 22.93 kg/m2  SpO2  46% 81 year old woman in no acute distress Well-developed and well-nourished Alert and oriented 3 with no focal neurologic deficits No cervical or subclavicular adenopathy Lungs clear with equal breath sounds bilaterally Cardiac regular rate and rhythm normal S1 and S2, faint systolic murmur   Diagnostic Tests: CT ANGIOGRAPHY CHEST WITH CONTRAST  TECHNIQUE: Multidetector CT imaging of the chest was performed using the standard protocol during bolus administration of intravenous contrast. Multiplanar CT image reconstructions and MIPs were obtained to evaluate the vascular anatomy.  CONTRAST: 75 mL of Isovue 370 intravenous contrast  COMPARISON: 10/23/2014  FINDINGS: Angiographic study: Ascending thoracic aortic aneurysm measures 4.8 x 4.7 cm, without change from the prior study. Aorta becomes normal in caliber along the distal aortic arch and descending portion. There is no dissection. Minor  atherosclerotic plaque is noted along the descending thoracic aorta. Aortic arch branch vessels show a standard branching pattern and are widely patent.  Thoracic inlet: No mass or adenopathy. Thyroid is unremarkable.  Mediastinum and hila: Heart is mildly enlarged. No significant coronary artery calcifications. Pulmonary arteries are normal in caliber. No mediastinal or hilar masses or pathologically enlarged lymph nodes.  Lungs and pleura: There stable areas of lung scarring. No evidence of pneumonia or edema. No lung mass or suspicious nodule. No pleural effusion or pneumothorax.  Limited upper abdomen: Stable mild chronic dilation of the pancreatic duct. No other abnormality.  Musculoskeletal: Mild endplate spurring noted along the mid to lower thoracic spine. No osteoblastic or osteolytic lesions.  Review of the MIP images confirms the above findings.  IMPRESSION: 1. Stable ascending thoracic aortic aneurysm measuring 4.8 cm in greatest dimension. No dissection. 2. Mild cardiomegaly, also unchanged. 3. Stable areas of lung scarring. No acute findings in the lungs.   Electronically Signed  By: Lajean Manes M.D.  I personally reviewed her CT chest and concur with the findings as noted above. No evidence of recurrent lung cancer and no change in ascending aortic aneurysm  Impression: 1. Ascending aortic aneurysm. Stable at 4.8 cm. This needs continued follow-up. She is once again cautioned about the signs and symptoms of aortic dissection or rupture and knows to call 911 immediately should she experience severe chest pain.  2. Hypertension. Her blood pressure is elevated today at 150/80. This may be due to having to rush a CT and then up to the office immediately. She has a blood pressure cuff at home. She will check her blood pressure regularly over the next couple weeks. If it remains elevated she will contact Dr. Everlene Farrier.  3. Stage IA adenocarcinoma with  lepidic spread. No evidence of recurrence at 2 1/2 years.  Plan: Return in 6 months with CT angiogram of chest  Melrose Nakayama, MD [facility] [phone]

## 2015-05-22 ENCOUNTER — Ambulatory Visit: Payer: Medicare Other | Admitting: Gynecology

## 2015-06-03 ENCOUNTER — Ambulatory Visit (INDEPENDENT_AMBULATORY_CARE_PROVIDER_SITE_OTHER): Payer: Medicare Other | Admitting: Gynecology

## 2015-06-03 ENCOUNTER — Encounter: Payer: Self-pay | Admitting: Gynecology

## 2015-06-03 VITALS — BP 122/78

## 2015-06-03 DIAGNOSIS — N890 Mild vaginal dysplasia: Secondary | ICD-10-CM

## 2015-06-03 NOTE — Progress Notes (Signed)
Haley [name] October 14, 1933 [id number]        81 y.o.  G2P2 presents for colposcopy. History of small vaginal nodule noted during her exam at Dr. Perfecto Kingdom office.  Earlier this year. Exam showed a small submucosal cyst which was biopsied off and returned mullerian cyst. 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 I've asked her to return now in 6 months time for repeat colposcopy. Her Pap smear earlier this year was negative.  Past medical history,surgical history, problem list, medications, allergies, family history and social history were all reviewed and documented in the EPIC chart.  Directed ROS with pertinent positives and negatives documented in the history of present illness/assessment and plan.  Exam: Kim assistant Filed Vitals:   06/03/15 1434  BP: 122/78   General appearance:  Normal External BUS vagina with atrophic changes. No grossly visible abnormalities or palpable abnormalities. Cervix atrophic. Uterus normal size midline mobile nontender. Adnexa without masses or tenderness.  Colposcopy after acetic acid cleanse is inadequate normal.  No abnormalities were seen both involving the cervix or vaginal mucosa.  Assessment/Plan:  81 y.o. G2P2 with normal colposcopy after vaginal biopsy showed VAIN 1. Recommend patient follow up routinely when due for annual exam. I would suggest Pap smears of the cervix and upper vaginal canal annually for now. Patients can follow up either here or with Dr. Everlene Farrier when she is due for her annual breasts and pelvic exam.    Anastasio Auerbach MD, 2:50 PM 06/03/2015

## 2015-06-03 NOTE — Patient Instructions (Signed)
Follow up routinely when you're due for your annual exam.

## 2015-08-05 ENCOUNTER — Encounter: Payer: Self-pay | Admitting: Gastroenterology

## 2015-09-05 ENCOUNTER — Encounter: Payer: Self-pay | Admitting: Emergency Medicine

## 2015-09-05 ENCOUNTER — Ambulatory Visit (INDEPENDENT_AMBULATORY_CARE_PROVIDER_SITE_OTHER): Payer: Medicare Other | Admitting: Emergency Medicine

## 2015-09-05 VITALS — BP 134/70 | HR 65 | Temp 98.0°F | Resp 16 | Ht 66.5 in | Wt 145.6 lb

## 2015-09-05 DIAGNOSIS — Z780 Asymptomatic menopausal state: Secondary | ICD-10-CM | POA: Diagnosis not present

## 2015-09-05 DIAGNOSIS — Z0000 Encounter for general adult medical examination without abnormal findings: Secondary | ICD-10-CM | POA: Diagnosis not present

## 2015-09-05 DIAGNOSIS — E7800 Pure hypercholesterolemia, unspecified: Secondary | ICD-10-CM

## 2015-09-05 DIAGNOSIS — N951 Menopausal and female climacteric states: Secondary | ICD-10-CM

## 2015-09-05 DIAGNOSIS — I10 Essential (primary) hypertension: Secondary | ICD-10-CM | POA: Diagnosis not present

## 2015-09-05 DIAGNOSIS — N898 Other specified noninflammatory disorders of vagina: Secondary | ICD-10-CM

## 2015-09-05 LAB — POCT URINALYSIS DIP (MANUAL ENTRY)
BILIRUBIN UA: NEGATIVE
Bilirubin, UA: NEGATIVE
Blood, UA: NEGATIVE
Glucose, UA: NEGATIVE
LEUKOCYTES UA: NEGATIVE
NITRITE UA: NEGATIVE
PH UA: 5.5
Spec Grav, UA: 1.025
UROBILINOGEN UA: 0.2

## 2015-09-05 LAB — LIPID PANEL
CHOLESTEROL: 150 mg/dL (ref 125–200)
HDL: 77 mg/dL (ref >=46)
LDL CALC: 54 mg/dL (ref <130)
Total CHOL/HDL Ratio: 1.9 Ratio (ref <=5.0)
Triglycerides: 97 mg/dL (ref <150)
VLDL: 19 mg/dL (ref <30)

## 2015-09-05 LAB — COMPLETE METABOLIC PANEL WITH GFR
ALBUMIN: 3.9 g/dL (ref 3.6–5.1)
ALK PHOS: 59 U/L (ref 33–130)
ALT: 10 U/L (ref 6–29)
AST: 17 U/L (ref 10–35)
BUN: 14 mg/dL (ref 7–25)
CALCIUM: 8.9 mg/dL (ref 8.6–10.4)
CO2: 22 mmol/L (ref 20–31)
Chloride: 106 mmol/L (ref 98–110)
Creat: 0.8 mg/dL (ref 0.60–0.93)
GFR, EST NON AFRICAN AMERICAN: 70 mL/min (ref >=60)
GFR, Est African American: 81 mL/min (ref >=60)
Glucose, Bld: 86 mg/dL (ref 65–99)
POTASSIUM: 4 mmol/L (ref 3.5–5.3)
SODIUM: 140 mmol/L (ref 135–146)
Total Bilirubin: 1.1 mg/dL (ref 0.2–1.2)
Total Protein: 6.7 g/dL (ref 6.1–8.1)

## 2015-09-05 LAB — CBC WITH DIFFERENTIAL/PLATELET
BASOS PCT: 1 % (ref 0–1)
Basophils Absolute: 0 10*3/uL (ref 0.0–0.1)
Eosinophils Absolute: 0 10*3/uL (ref 0.0–0.7)
Eosinophils Relative: 1 % (ref 0–5)
HEMATOCRIT: 36.3 % (ref 36.0–46.0)
HEMOGLOBIN: 12.2 g/dL (ref 12.0–15.0)
Lymphocytes Relative: 40 % (ref 12–46)
Lymphs Abs: 1.5 10*3/uL (ref 0.7–4.0)
MCH: 30.8 pg (ref 26.0–34.0)
MCHC: 33.6 g/dL (ref 30.0–36.0)
MCV: 91.7 fL (ref 78.0–100.0)
MONO ABS: 0.4 10*3/uL (ref 0.1–1.0)
MONOS PCT: 11 % (ref 3–12)
MPV: 8.7 fL (ref 8.6–12.4)
NEUTROS ABS: 1.8 10*3/uL (ref 1.7–7.7)
Neutrophils Relative %: 47 % (ref 43–77)
Platelets: 240 10*3/uL (ref 150–400)
RBC: 3.96 MIL/uL (ref 3.87–5.11)
RDW: 13.6 % (ref 11.5–15.5)
WBC: 3.8 10*3/uL — ABNORMAL LOW (ref 4.0–10.5)

## 2015-09-05 LAB — POC MICROSCOPIC URINALYSIS (UMFC): Mucus: ABSENT

## 2015-09-05 NOTE — Progress Notes (Addendum)
Patient ID: Haley [name], female   DOB: 1933-12-16, 82 y.o.   MRN: [id number]     By signing my name below, I, Zola Button, attest that this documentation has been prepared under the direction and in the presence of Arlyss Queen, MD.  Electronically Signed: Zola Button, Medical Scribe. 09/05/2015. 8:16 AM.   Chief Complaint:  Chief Complaint  Patient presents with  . Annual Exam    HPI: Haley [name] is a 82 y.o. female with a history of ascending aortic aneurysm, lung cancer, breast carcinoma, and hypertension who reports to [facility] today for an annual exam. Patient has been doing well overall.  She sees [name], cardiac and thoracic surgeon. She is not followed by an oncologist currently; she had been released by Dr. Truddie Coco about 14 years ago. She also sees Dr. Phineas Real, OB/GYN and Dr. Claiborne Billings, cardiologist. Patient has mammograms every year. Her last colonoscopy was in 2013, done by Dr. Sharlett Iles, and was told to repeat in 5 years due to polyps. She has seen Dr. Hilarie Fredrickson before as well. She believes she is due for a bone density scan later this year. She does have her eyes examined yearly. Patient denies depression symptoms and falls in the past year.  Patient notes she has had problems with allergies recently and has been taking Zyrtec.   Patient will be participating in a research study ([employer]) to test whether cocoa extract or multivitamin supplements can prevent heart disease, stroke, and cancer as well as provide other health benefits.  She lives with her husband. She has 2 children and 2 grandchildren. Her brother also lives locally.  Past Medical History  Diagnosis Date  . Urinary tract infection, site not specified   . Symptomatic menopausal or female climacteric states   . Fatty liver   . Bronchitis, not specified as acute or chronic   . Hiatal hernia   . Atrophic gastritis without mention of hemorrhage   . Cardiac dysrhythmia, unspecified   . Pneumonia, organism  unspecified   . Pure hypercholesterolemia   . Unspecified essential hypertension   . Cough   . Irritable bowel syndrome   . Other chest pain   . Esophageal reflux   . Chronic pancreatitis ([place])   . Flatulence, eructation, and gas pain   . Gastric erosions     [name]  . Irritable bowel syndrome   . Family history of malignant neoplasm of gastrointestinal tract   . Sorethroat     since virus 2 months ago  . Hoarseness     since virus 2 months agp  . Complication of anesthesia   . PONV (postoperative nausea and vomiting)     with hemorrhoid surgery years ago, no n/v with breast surgery  . Shortness of breath     with Exertion  . Arthritis     knees and left hip  . Cataract   . Thoracic aortic aneurysm without rupture ([place]) 08/29/12    08/29/12 discovered on PET/CT  . Tubular adenoma of colon   . Malignant neoplasm of breast (female), unspecified site     Skin Cancer legs, face, shoulder, some basal some squamous  . Cancer [facility])     Lung cancer  . LGSIL (low grade squamous intraepithelial dysplasia) 11/15/2014    with biopsy of  benign vaginal cyst   Past Surgical History  Procedure Laterality Date  . Mastectomy, partial  2004    right   . Hemorroidectomy    . Cataract extraction  Bilateral  . Colonoscopy    . Eye surgery    . Skin cancer excision    . Video assisted thoracoscopy (vats)/wedge resection Left 09/19/2012    Procedure: LEFT VIDEO ASSISTED THORACOSCOPY (VATS)/LEFT UPPER LOBE WEDGE RESECTION & NODE DISSECTION;  Surgeon: Melrose Nakayama, MD;  Location: [place];  Service: Thoracic;  Laterality: Left;  . Breast surgery     Social History   Social History  . Marital Status: Married    Spouse Name: N/A  . Number of Children: 2  . Years of Education: N/A   Occupational History  . Retired     Tourist information centre manager   Social History Main Topics  . Smoking status: Never Smoker   . Smokeless tobacco: Never Used  . Alcohol Use: No  . Drug Use: No  . Sexual Activity:  No     Comment: 1st intercourse 89 yo-1 partner   Other Topics Concern  . None   Social History Narrative   Married   Retired   Careers adviser         Family History  Problem Relation Age of Onset  . Colon cancer Father 60  . Cancer Father   . Stroke Father   . Diabetes Paternal Aunt   . Heart disease Mother   . Heart disease Brother   . Cancer Brother     mass behind lung-died age 87  . Multiple myeloma Brother 13    died age 59   No Known Allergies Prior to Admission medications   Medication Sig Start Date End Date Taking? Authorizing Provider  atorvastatin (LIPITOR) 20 MG tablet Take 1 tablet (20 mg total) by mouth daily. 10/01/14   Darlyne Russian, MD  b complex vitamins tablet Take 1 tablet by mouth daily.     Historical Provider, MD  Cholecalciferol (VITAMIN D3) 2000 UNITS TABS Take 1 tablet by mouth daily.      Historical Provider, MD  co-enzyme Q-10 30 MG capsule Take 300 mg by mouth daily.    Historical Provider, MD  metoprolol succinate (TOPROL-XL) 25 MG 24 hr tablet Take 0.5 tablets (12.5 mg total) by mouth daily as needed. FOR HYPERTENSION 09/11/14   Darlyne Russian, MD  verapamil (CALAN-SR) 120 MG CR tablet Take 1 tablet daily 11/06/14   Troy Sine, MD     ROS: The patient denies fevers, chills, night sweats, unintentional weight loss, chest pain, palpitations, wheezing, dyspnea on exertion, nausea, vomiting, abdominal pain, dysuria, hematuria, melena, numbness, weakness, or tingling.   All other systems have been reviewed and were otherwise negative with the exception of those mentioned in the HPI and as above.    PHYSICAL EXAM: Filed Vitals:   09/05/15 0810  BP: 134/70  Pulse: 65  Temp: 98 F (36.7 C)  Resp: 16   Body mass index is 23.15 kg/(m^2).   General: Alert, no acute distress HEENT:  Normocephalic, atraumatic, oropharynx patent. Eye: Bilateral cataracts surgery. Cardiovascular: Grade 1 murmur at the base of the  heart. Respiratory: Clear to auscultation bilaterally.  No wheezes, rales, or rhonchi.  No cyanosis, no use of accessory musculature Abdominal: No organomegaly, abdomen is soft and non-tender, positive bowel sounds.  No masses. Musculoskeletal: Gait intact. No edema, tenderness Skin: No rashes. Neurologic: Facial musculature symmetric. Psychiatric: Patient acts appropriately throughout our interaction. Lymphatic: No cervical or submandibular lymphadenopathy Genitourinary: Scar beneath the right breast with skin changes from previous breast cancer treatment. Pelvic deferred.  LABS: Results for orders placed or performed in visit on 09/05/15  POCT urinalysis dipstick  Result Value Ref Range   Color, UA yellow yellow   Clarity, UA clear clear   Glucose, UA negative negative   Bilirubin, UA negative negative   Ketones, POC UA negative negative   Spec Grav, UA 1.025    Blood, UA negative negative   pH, UA 5.5    Protein Ur, POC trace (A) negative   Urobilinogen, UA 0.2    Nitrite, UA Negative Negative   Leukocytes, UA Negative Negative  POCT Microscopic Urinalysis ([facility])  Result Value Ref Range   WBC,UR,HPF,POC None None WBC/hpf   RBC,UR,HPF,POC None None RBC/hpf   Bacteria None None, Too numerous to count   Mucus Absent Absent   Epithelial Cells, UR Per Microscopy Few (A) None, Too numerous to count cells/hpf     EKG/XRAY:   Primary read interpreted by Dr. Everlene Farrier at [facility].   ASSESSMENT/PLAN: Patient looks great. She is doing well. No evidence of cancer recurrence. She is followed closely by her lung doctor. She will keep her regular follow-ups with GYN. Routine labs were done today. No change in medications at present time. She states she had a pneumonia vaccine 23 at sometime in the past and is updated on her Prevnar last visit.I personally performed the services described in this documentation, which was scribed in my presence. The recorded information has been reviewed  and is accurate.   Gross sideeffects, risk and benefits, and alternatives of medications d/w patient. Patient is aware that all medications have potential sideeffects and we are unable to predict every sideeffect or drug-drug interaction that may occur.  Arlyss Queen MD 09/05/2015 8:16 AM

## 2015-09-06 LAB — VITAMIN D 25 HYDROXY (VIT D DEFICIENCY, FRACTURES): VIT D 25 HYDROXY: 36 ng/mL (ref 30–100)

## 2015-09-11 ENCOUNTER — Encounter: Payer: Self-pay | Admitting: *Deleted

## 2015-09-26 ENCOUNTER — Other Ambulatory Visit: Payer: Self-pay

## 2015-09-26 DIAGNOSIS — E2839 Other primary ovarian failure: Secondary | ICD-10-CM

## 2015-10-01 ENCOUNTER — Other Ambulatory Visit: Payer: Self-pay | Admitting: Emergency Medicine

## 2015-10-18 ENCOUNTER — Other Ambulatory Visit: Payer: Self-pay | Admitting: Thoracic Surgery (Cardiothoracic Vascular Surgery)

## 2015-10-18 DIAGNOSIS — I2541 Coronary artery aneurysm: Secondary | ICD-10-CM

## 2015-11-13 ENCOUNTER — Other Ambulatory Visit: Payer: Self-pay | Admitting: Emergency Medicine

## 2015-11-19 ENCOUNTER — Ambulatory Visit: Payer: Medicare Other | Admitting: Thoracic Surgery (Cardiothoracic Vascular Surgery)

## 2015-11-19 ENCOUNTER — Ambulatory Visit
Admission: RE | Admit: 2015-11-19 | Discharge: 2015-11-19 | Disposition: A | Discharge status: Home / Self Care | Payer: Medicare Other | Source: Ambulatory Visit | Attending: Thoracic Surgery (Cardiothoracic Vascular Surgery) | Admitting: Thoracic Surgery (Cardiothoracic Vascular Surgery)

## 2015-11-19 ENCOUNTER — Other Ambulatory Visit: Payer: Self-pay | Admitting: Thoracic Surgery (Cardiothoracic Vascular Surgery)

## 2015-11-19 DIAGNOSIS — I2541 Coronary artery aneurysm: Secondary | ICD-10-CM

## 2015-11-19 IMAGING — CT CT ANGIO CHEST
2 of 5 series · 10 of 30 positions shown · IV contrast (75CC ISOVUE 370)
Comparison: [DATE]

CLINICAL DATA: Aortic aneurysm

EXAM:
CT ANGIOGRAPHY CHEST WITH CONTRAST
TECHNIQUE: Multidetector CT imaging of the chest was performed using the
standard protocol during bolus administration of intravenous
contrast. Multiplanar CT image reconstructions and MIPs were
obtained to evaluate the vascular anatomy.
CONTRAST:  75 cc Isovue 370
Creatinine was obtained on site at [HOSPITAL] at [HOSPITAL].
Results: Creatinine 0.8 mg/dL.

[Series 4: angio · axial · 0.69mm/px · z∈[-252,-27]mm · 5 of 136 slices shown]
[im 23/136  lung]
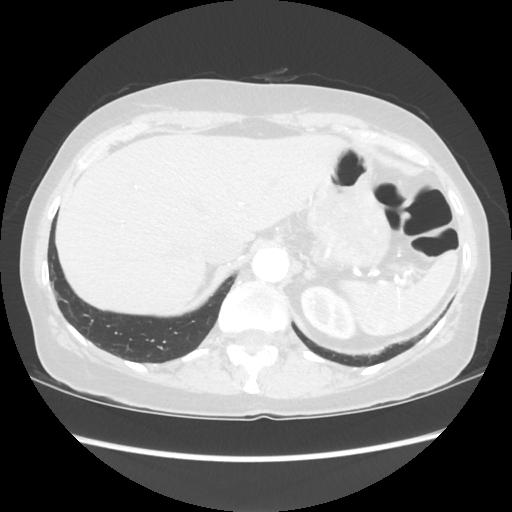
[im 46/136  mediastinal]
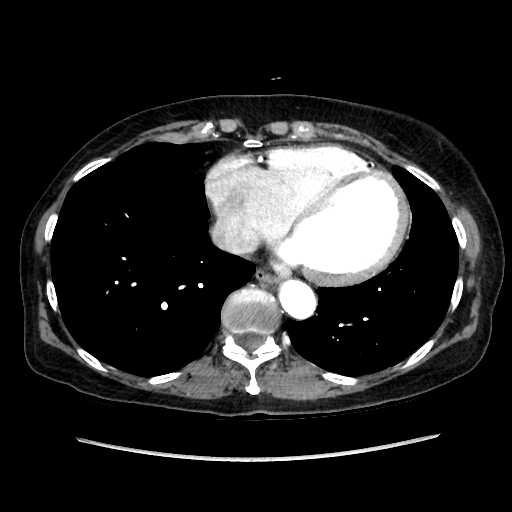
[im 68/136  lung]
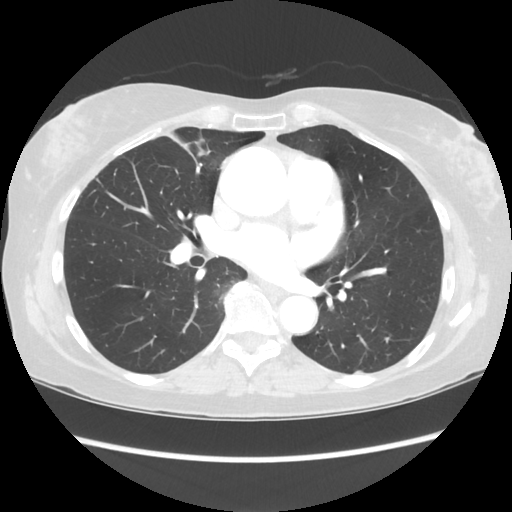
[im 91/136  mediastinal]
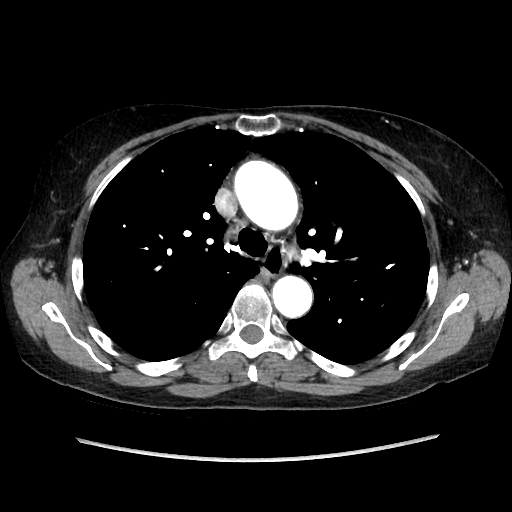
[im 113/136  lung]
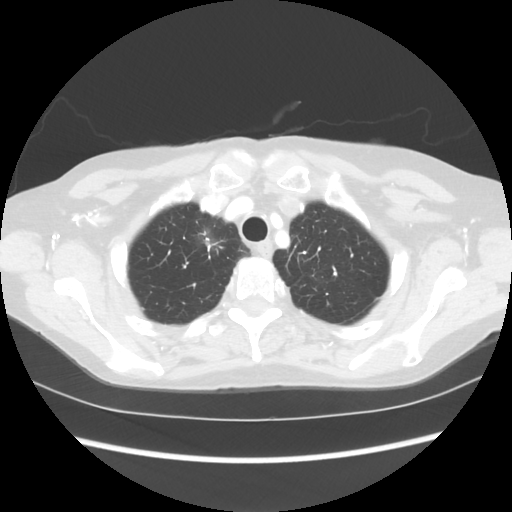

[Series 602: sagittal body · sagittal · 0.69mm/px · 5 of 142 slices shown]
[im 24/142  lung]
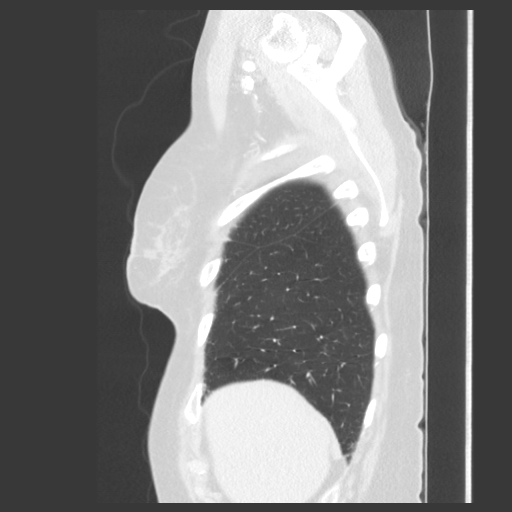
[im 48/142  lung]
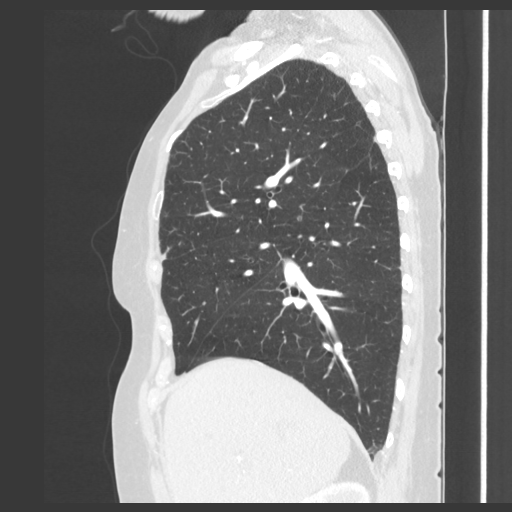
[im 71/142  lung]
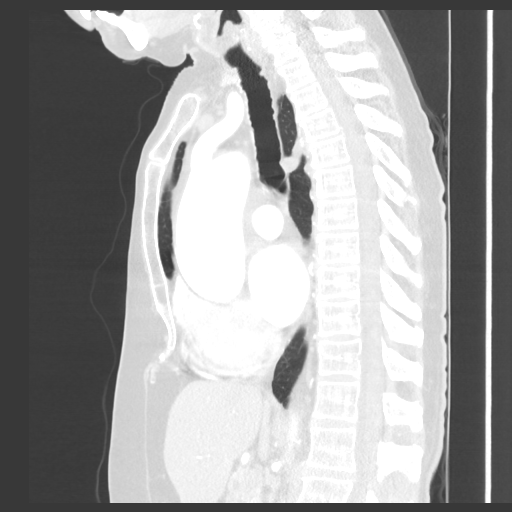
[im 95/142  lung]
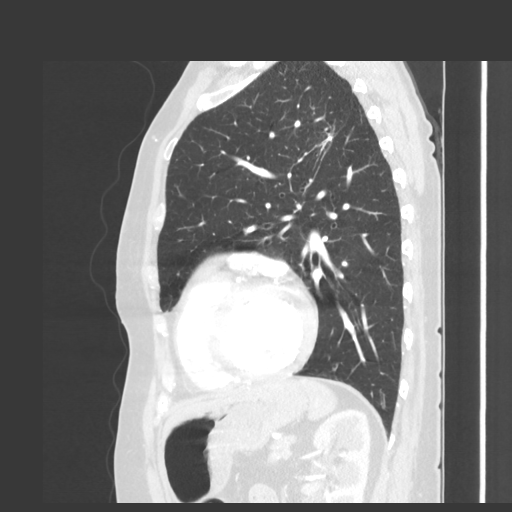
[im 118/142  lung]
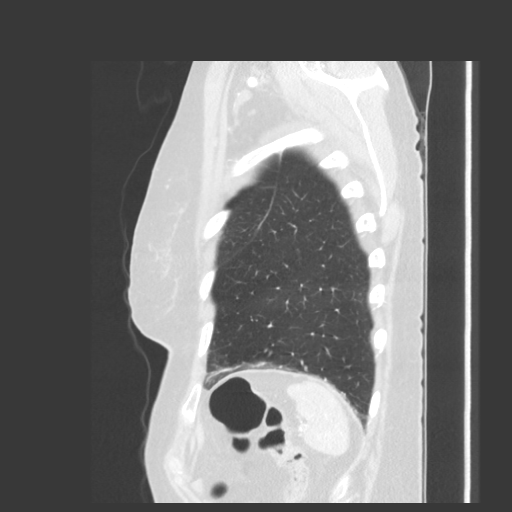

[10 of 30 positions shown; findings below may reference images not displayed]

FINDINGS: Maximal diameter of the ascending aorta is 4.7 cm. Previously, it
measured 4.8 cm. It is not significantly changed allowing for
differences in measuring technique. Allowing for some motion
artifact in the ascending aorta, there is no evidence of dissection.
The sinus of Valsalva and DANIEL ADRIAN junction are non aneurysmal
with measurements of 4.1 cm and 3.6 cm respectively

Great vessels are patent within the confines of the examination.

No abnormal mediastinal adenopathy.

No evidence of pulmonary embolism

Stable right upper lobe scarring on image 23. Stable atelectasis in
the anterior right middle lobe, base of the anterior basal segment
of the right lower lobe, and base of the left lower lobe. Linear
atelectasis or scarring in the posterior left upper lobe.

No pneumothorax.  No pleural effusion

Retro aortic left renal vein anatomy.

No vertebral compression deformity.

Review of the MIP images confirms the above findings.
IMPRESSION: Ascending thoracic aortic aneurysm measures 4.7 cm. This is not
significantly changed. Ascending thoracic aortic aneurysm. Recommend
semi-annual imaging followup by CTA or MRA and referral to
cardiothoracic surgery if not already obtained. This recommendation
follows [Z4] ACCF/AHA/AATS/ACR/ASA/SCA/DANIEL ADRIAN/DANIEL ADRIAN/DANIEL ADRIAN/DANIEL ADRIAN Guidelines
for the Diagnosis and Management of Patients With Thoracic Aortic
Disease. Circulation. [Z4]; 121: e266-e369

## 2015-11-19 MED ORDER — IOPAMIDOL (ISOVUE-370) INJECTION 76%
75.0000 mL | Freq: Once | INTRAVENOUS | Status: AC | PRN
Start: 2015-11-19 — End: 2015-11-19
  Administered 2015-11-19: 75 mL via INTRAVENOUS

## 2015-11-26 ENCOUNTER — Ambulatory Visit (INDEPENDENT_AMBULATORY_CARE_PROVIDER_SITE_OTHER): Payer: Medicare Other | Admitting: Thoracic Surgery (Cardiothoracic Vascular Surgery)

## 2015-11-26 ENCOUNTER — Encounter: Payer: Self-pay | Admitting: Thoracic Surgery (Cardiothoracic Vascular Surgery)

## 2015-11-26 VITALS — BP 130/75 | HR 58 | Resp 16 | Ht 66.5 in | Wt 148.0 lb

## 2015-11-26 DIAGNOSIS — C3412 Malignant neoplasm of upper lobe, left bronchus or lung: Secondary | ICD-10-CM | POA: Diagnosis not present

## 2015-11-26 DIAGNOSIS — I712 Thoracic aortic aneurysm, without rupture: Secondary | ICD-10-CM | POA: Diagnosis not present

## 2015-11-26 DIAGNOSIS — Z09 Encounter for follow-up examination after completed treatment for conditions other than malignant neoplasm: Secondary | ICD-10-CM | POA: Diagnosis not present

## 2015-11-26 DIAGNOSIS — I7121 Aneurysm of the ascending aorta, without rupture: Secondary | ICD-10-CM

## 2015-11-26 NOTE — Progress Notes (Signed)
[address]       [place] [address]             [phone]       HPI: Mrs. Jabs returns today for a scheduled 6 month follow-up visit.  She is a 82 year old woman who is a lifelong nonsmoker. She had a wedge resection of the left upper lobe for a stage IA adenocarcinoma with lepidic spread in February 2014. She has had no evidence recurrent disease since then.   She also has an ascending aortic aneurysm that was 4.8 cm in diameter on her last scan in October 2016. Her aneurysm dates back about 10 years. It was 4.5 cm in 2011.  She has been feeling well. She has not had any major respiratory issues in the last 6 months. She denies any chest pain, pressure, or tightness. She has an occasional cough, no hemoptysis. Her appetite is good and she denies weight loss. Denies unusual headaches or visual changes.  Past Medical History  Diagnosis Date  . Urinary tract infection, site not specified   . Symptomatic menopausal or female climacteric states   . Fatty liver   . Bronchitis, not specified as acute or chronic   . Hiatal hernia   . Atrophic gastritis without mention of hemorrhage   . Cardiac dysrhythmia, unspecified   . Pneumonia, organism unspecified   . Pure hypercholesterolemia   . Unspecified essential hypertension   . Cough   . Irritable bowel syndrome   . Other chest pain   . Esophageal reflux   . Chronic pancreatitis ([place])   . Flatulence, eructation, and gas pain   . Gastric erosions     [name]  . Irritable bowel syndrome   . Family history of malignant neoplasm of gastrointestinal tract   . Sorethroat     since virus 2 months ago  . Hoarseness     since virus 2 months agp  . Complication of anesthesia   . PONV (postoperative nausea and vomiting)     with hemorrhoid surgery years ago, no n/v with breast surgery  . Shortness of breath     with Exertion  . Arthritis     knees and left hip  . Cataract   . Thoracic aortic aneurysm without  rupture ([place]) 08/29/12    08/29/12 discovered on PET/CT  . Tubular adenoma of colon   . Malignant neoplasm of breast (female), unspecified site     Skin Cancer legs, face, shoulder, some basal some squamous  . Cancer [facility])     Lung cancer  . LGSIL (low grade squamous intraepithelial dysplasia) 11/15/2014    with biopsy of  benign vaginal cyst     Current Outpatient Prescriptions  Medication Sig Dispense Refill  . atorvastatin (LIPITOR) 20 MG tablet Take 1 tablet by mouth  daily 90 tablet 0  . b complex vitamins tablet Take 1 tablet by mouth daily.     . Cholecalciferol (VITAMIN D3) 10000 units TABS Take 1 tablet by mouth daily.    Marland Kitchen co-enzyme Q-10 30 MG capsule Take 300 mg by mouth daily.    . metoprolol succinate (TOPROL-XL) 25 MG 24 hr tablet Take 0.5 tablets (12.5 mg total) by mouth daily as needed. FOR HYPERTENSION 90 tablet 3  . verapamil (CALAN-SR) 120 MG CR tablet Take 1 tablet by mouth  daily 90 tablet 1   No current facility-administered medications for this visit.    Physical Exam BP 130/75 mmHg  Pulse 58  Resp 16  Ht 5' 6.5" (1.689 m)  Wt 148 lb (67.132 kg)  BMI 23.53 kg/m2  SpO2 96% Well-appearing 82 year old woman who appears younger than stated age Alert and oriented 3 with no focal neurologic deficits Lungs clear with equal breath sounds bilaterally No cervical or subclavicular adenopathy Cardiac regular rate and rhythm normal S1 and S2 No carotid bruits. 2+ pulses bilaterally  Diagnostic Tests: CT ANGIOGRAPHY CHEST WITH CONTRAST  TECHNIQUE: Multidetector CT imaging of the chest was performed using the standard protocol during bolus administration of intravenous contrast. Multiplanar CT image reconstructions and MIPs were obtained to evaluate the vascular anatomy.  CONTRAST: 75 cc Isovue 370  Creatinine was obtained on site at [facility] at [address]  Results: Creatinine 0.8 mg/dL.  COMPARISON:  05/21/2015  FINDINGS: Maximal diameter of the ascending aorta is 4.7 cm. Previously, it measured 4.8 cm. It is not significantly changed allowing for differences in measuring technique. Allowing for some motion artifact in the ascending aorta, there is no evidence of dissection. The sinus of Valsalva and sino-tubular junction are non aneurysmal with measurements of 4.1 cm and 3.6 cm respectively  Great vessels are patent within the confines of the examination.  No abnormal mediastinal adenopathy.  No evidence of pulmonary embolism  Stable right upper lobe scarring on image 23. Stable atelectasis in the anterior right middle lobe, base of the anterior basal segment of the right lower lobe, and base of the left lower lobe. Linear atelectasis or scarring in the posterior left upper lobe.  No pneumothorax. No pleural effusion  Retro aortic left renal vein anatomy.  No vertebral compression deformity.  Review of the MIP images confirms the above findings.  IMPRESSION: Ascending thoracic aortic aneurysm measures 4.7 cm. This is not significantly changed. Ascending thoracic aortic aneurysm. Recommend semi-annual imaging followup by CTA or MRA and referral to cardiothoracic surgery if not already obtained. This recommendation follows 2010 ACCF/AHA/AATS/ACR/ASA/SCA/SCAI/SIR/STS/SVM Guidelines for the Diagnosis and Management of Patients With Thoracic Aortic Disease. Circulation. 2010; 121: [id number]   Electronically Signed  By: Marybelle Killings M.D.  On: 11/19/2015 15:25 I personally reviewed the CT chest and concur with the findings noted above  Impression: 82 year old woman who had a wedge resection for a left upper lobe stage IA adenocarcinoma in February 2014.  Lung cancer- no evidence recurrent disease at 3 years out from surgery. She will be getting CT scans to follow her aneurysm, but does need CT out to 5 years.  Ascending aortic aneurysm- stable at 4.7-4.8  cm. This has increased minimally over the past 6 years. There is no indication for surgery at the present time. Blood pressure control as the primary concern. Given the size of the aneurysm she needs CT scans every 6 months.  Hypertension- managed by Dr. Everlene Farrier. Her blood pressure is well controlled today  Plan: Return in 6 months with CT angiogram of chest.  Melrose Nakayama, MD [facility] [phone]

## 2015-12-02 ENCOUNTER — Telehealth: Payer: Self-pay

## 2015-12-02 NOTE — Telephone Encounter (Signed)
Pt is needing to know again from dr Everlene Farrier the name of the physician and [facility] number [phone]

## 2015-12-02 NOTE — Telephone Encounter (Signed)
I did not see a name in the notes.

## 2015-12-02 NOTE — Telephone Encounter (Signed)
Patient can stop by 104 Tuesday through Thursday to pick A copy of the physicians at [facility].

## 2015-12-03 NOTE — Telephone Encounter (Signed)
Is this a brochure?

## 2015-12-03 NOTE — Telephone Encounter (Signed)
It is a listing of the physicians at [facility] to take new patients. If she stops by on Thursday at 104 I will be sure she gets the correct form

## 2015-12-04 NOTE — Telephone Encounter (Signed)
Advised pt

## 2015-12-04 NOTE — Telephone Encounter (Signed)
Left message for pt to call back  °

## 2016-01-19 ENCOUNTER — Other Ambulatory Visit: Payer: Self-pay | Admitting: Emergency Medicine

## 2016-02-03 ENCOUNTER — Other Ambulatory Visit: Payer: Self-pay | Admitting: Emergency Medicine

## 2016-02-03 DIAGNOSIS — Z853 Personal history of malignant neoplasm of breast: Secondary | ICD-10-CM

## 2016-02-03 DIAGNOSIS — Z1231 Encounter for screening mammogram for malignant neoplasm of breast: Secondary | ICD-10-CM

## 2016-02-21 ENCOUNTER — Ambulatory Visit
Admission: RE | Admit: 2016-02-21 | Discharge: 2016-02-21 | Disposition: A | Discharge status: Home / Self Care | Payer: Medicare Other | Source: Ambulatory Visit | Attending: Emergency Medicine | Admitting: Emergency Medicine

## 2016-02-21 DIAGNOSIS — Z1231 Encounter for screening mammogram for malignant neoplasm of breast: Secondary | ICD-10-CM

## 2016-02-21 DIAGNOSIS — Z853 Personal history of malignant neoplasm of breast: Secondary | ICD-10-CM

## 2016-02-21 DIAGNOSIS — E2839 Other primary ovarian failure: Secondary | ICD-10-CM

## 2016-02-27 ENCOUNTER — Other Ambulatory Visit: Payer: Self-pay | Admitting: Emergency Medicine

## 2016-04-16 ENCOUNTER — Other Ambulatory Visit: Payer: Self-pay | Admitting: Emergency Medicine

## 2016-04-27 ENCOUNTER — Ambulatory Visit (INDEPENDENT_AMBULATORY_CARE_PROVIDER_SITE_OTHER): Payer: Medicare Other | Admitting: Family Medicine

## 2016-04-27 VITALS — BP 110/72 | HR 53 | Temp 97.9°F | Resp 18 | Ht 66.5 in | Wt 142.2 lb

## 2016-04-27 DIAGNOSIS — B9789 Other viral agents as the cause of diseases classified elsewhere: Secondary | ICD-10-CM

## 2016-04-27 DIAGNOSIS — J069 Acute upper respiratory infection, unspecified: Secondary | ICD-10-CM | POA: Diagnosis not present

## 2016-04-27 NOTE — Patient Instructions (Addendum)
  Thank you for coming in,   Please follow up with [place] if your symptoms are lasting for 5 or 7 days.   You can try gargling salt water, honey, or chloraseptic spray.    Please feel free to call with any questions or concerns at any time, at [phone]. --Dr. Raeford Razor    IF you received an x-ray today, you will receive an invoice from [facility]. Please contact [facility] at [phone] with questions or concerns regarding your invoice.   IF you received labwork today, you will receive an invoice from [employer]. Please contact [facility] at [phone] with questions or concerns regarding your invoice.   Our billing staff will not be able to assist you with questions regarding bills from these companies.  You will be contacted with the lab results as soon as they are available. The fastest way to get your results is to activate your My Chart account. Instructions are located on the last page of this paperwork. If you have not heard from [place] regarding the results in 2 weeks, please contact this office.

## 2016-04-27 NOTE — Assessment & Plan Note (Signed)
Symptoms are acute and most likely viral. Looks well on exam.  - supportive care at this time.  - f/u PRN

## 2016-04-27 NOTE — Progress Notes (Signed)
   Subjective:    Patient ID: MALANIE KOLOSKI, female    DOB: 16-Jan-1934, 82 y.o.   MRN: [id number]  Chief Complaint  Patient presents with  . Sore Throat    Noticed last night.     PCP: Jenny Reichmann, MD  HPI  This is a 82 y.o. female who is presenting with sore throat. The pain started this morning. She has gargled with some salt water.  Her son has had a head cold. Denies any fever. No dyspnea She has had watery eyes. Denies any coughing. Has been taking her regular medications.   Review of Systems  ROS: No unexpected weight loss, fever, chills, swelling, instability, muscle pain, numbness/tingling, redness, otherwise see HPI   PMH: HLD, breast cancer, irregular heart beat, lung cancer  PShx: lung resection   PSx: no tobacco use,  FHx: cancer, heart problems     Objective:   Physical Exam BP 110/72   Pulse (!) 53   Temp 97.9 F (36.6 C) (Oral)   Resp 18   Ht 5' 6.5" (1.689 m)   Wt 142 lb 3.2 oz (64.5 kg)   SpO2 98%   BMI 22.61 kg/m  Gen: NAD, alert, cooperative with exam, HEENT: EOMI, clear conjunctiva, oropharynx clear, supple neck, TM's clear and intact, no cervical LAD  Resp: normal speech, non-labored Skin: no rashes, normal turgor  Neuro: no gross deficits.  Psych:  alert and oriented     Assessment & Plan:   Viral upper respiratory tract infection Symptoms are acute and most likely viral. Looks well on exam.  - supportive care at this time.  - f/u PRN

## 2016-04-29 NOTE — Progress Notes (Signed)
Note reviewed, and agree with documentation and plan. Signed,   Merri Ray, MD [facility].  04/29/16 11:43 AM

## 2016-05-19 ENCOUNTER — Other Ambulatory Visit: Payer: Self-pay | Admitting: Thoracic Surgery (Cardiothoracic Vascular Surgery)

## 2016-05-19 DIAGNOSIS — I712 Thoracic aortic aneurysm, without rupture: Secondary | ICD-10-CM

## 2016-05-19 DIAGNOSIS — I7121 Aneurysm of the ascending aorta, without rupture: Secondary | ICD-10-CM

## 2016-05-29 ENCOUNTER — Other Ambulatory Visit: Payer: Self-pay | Admitting: Emergency Medicine

## 2016-06-23 ENCOUNTER — Ambulatory Visit
Admission: RE | Admit: 2016-06-23 | Discharge: 2016-06-23 | Disposition: A | Discharge status: Home / Self Care | Payer: Medicare Other | Source: Ambulatory Visit | Attending: Thoracic Surgery (Cardiothoracic Vascular Surgery) | Admitting: Thoracic Surgery (Cardiothoracic Vascular Surgery)

## 2016-06-23 ENCOUNTER — Encounter: Payer: Self-pay | Admitting: Thoracic Surgery (Cardiothoracic Vascular Surgery)

## 2016-06-23 ENCOUNTER — Ambulatory Visit (INDEPENDENT_AMBULATORY_CARE_PROVIDER_SITE_OTHER): Payer: Medicare Other | Admitting: Thoracic Surgery (Cardiothoracic Vascular Surgery)

## 2016-06-23 VITALS — BP 124/76 | HR 68 | Resp 16 | Ht 66.5 in | Wt 145.0 lb

## 2016-06-23 DIAGNOSIS — C3412 Malignant neoplasm of upper lobe, left bronchus or lung: Secondary | ICD-10-CM | POA: Diagnosis not present

## 2016-06-23 DIAGNOSIS — Z09 Encounter for follow-up examination after completed treatment for conditions other than malignant neoplasm: Secondary | ICD-10-CM

## 2016-06-23 DIAGNOSIS — I7121 Aneurysm of the ascending aorta, without rupture: Secondary | ICD-10-CM

## 2016-06-23 DIAGNOSIS — I712 Thoracic aortic aneurysm, without rupture: Secondary | ICD-10-CM | POA: Diagnosis not present

## 2016-06-23 MED ORDER — IOPAMIDOL (ISOVUE-370) INJECTION 76%
75.0000 mL | Freq: Once | INTRAVENOUS | Status: AC | PRN
  Administered 2016-06-23: 75 mL via INTRAVENOUS

## 2016-06-23 NOTE — Progress Notes (Signed)
[address]       [place] [address]             [phone]       HPI: Haley [name] returns for a scheduled 6 month follow-up visit  She is a 82 year old woman who had a wedge resection for a left upper lobe nodule in February 2014. This was a stage IA adenocarcinoma with lepidic spread. She is a lifelong nonsmoker.  She also has an ascending aneurysm. This was first found in 2011 and was 4.5 cm. It was 4.7-4.8 cm on her last scan in May.  In the interval since her last visit she has been feeling well. She has not been having any chest pain, pressure, or tightness. She had a sore throat back in October. She has not had any shortness of breath or wheezing. Her appetite is good and her weight is stable.  Past Medical History:  Diagnosis Date  . Arthritis    knees and left hip  . Atrophic gastritis without mention of hemorrhage   . Bronchitis, not specified as acute or chronic   . Cancer [facility])    Lung cancer  . Cardiac dysrhythmia, unspecified   . Cataract   . Chronic pancreatitis ([place])   . Complication of anesthesia   . Cough   . Esophageal reflux   . Family history of malignant neoplasm of gastrointestinal tract   . Fatty liver   . Flatulence, eructation, and gas pain   . Gastric erosions    [name]  . Hiatal hernia   . Hoarseness    since virus 2 months agp  . Irritable bowel syndrome   . Irritable bowel syndrome   . LGSIL (low grade squamous intraepithelial dysplasia) 11/15/2014   with biopsy of  benign vaginal cyst  . Malignant neoplasm of breast (female), unspecified site    Skin Cancer legs, face, shoulder, some basal some squamous  . Other chest pain   . Pneumonia, organism unspecified(486)   . PONV (postoperative nausea and vomiting)    with hemorrhoid surgery years ago, no n/v with breast surgery  . Pure hypercholesterolemia   . Shortness of breath    with Exertion  . Sorethroat    since virus 2 months ago  . Symptomatic menopausal or  female climacteric states   . Thoracic aortic aneurysm without rupture ([place]) 08/29/12   08/29/12 discovered on PET/CT  . Tubular adenoma of colon   . Unspecified essential hypertension   . Urinary tract infection, site not specified       Current Outpatient Prescriptions  Medication Sig Dispense Refill  . atorvastatin (LIPITOR) 20 MG tablet Take 1 tablet by mouth  daily 90 tablet 0  . b complex vitamins tablet Take 1 tablet by mouth daily.     . Cholecalciferol (VITAMIN D3) 10000 units TABS Take 1 tablet by mouth daily.    Marland Kitchen co-enzyme Q-10 30 MG capsule Take 300 mg by mouth daily.    . metoprolol succinate (TOPROL-XL) 25 MG 24 hr tablet Take 0.5 tablets (12.5 mg total) by mouth daily as needed. FOR HYPERTENSION 90 tablet 3  . verapamil (CALAN-SR) 120 MG CR tablet TAKE 1 TABLET BY MOUTH  DAILY 90 tablet 0   No current facility-administered medications for this visit.     Physical Exam  Diagnostic Tests: CT ANGIOGRAPHY CHEST WITH CONTRAST  TECHNIQUE: Multidetector CT imaging of the chest was performed using the standard protocol during bolus administration of  intravenous contrast. Multiplanar CT image reconstructions and MIPs were obtained to evaluate the vascular anatomy.  CONTRAST:  75 mL Isovue 370 nonionic  COMPARISON:  November 19, 2015  FINDINGS: Cardiovascular: The measured diameter of the ascending thoracic aorta is 4.7 x 4.6 cm, not appreciably changed. There is no thoracic aortic dissection. No change in the appearance of the sinus of Valsalva. The visualized great vessels appear unremarkable. Pericardium is not appreciably thickened. There are scattered foci of atherosclerotic calcification in the aorta. No ulceration is noted in the aorta. There is no demonstrable pulmonary embolus.  Mediastinum/Nodes: Thyroid appears unremarkable. There is no evident thoracic adenopathy.  Lungs/Pleura: There is stable scarring in the anterior segment of the right upper lobe  near the apex. There is also scarring along the left major fissure. There is a stable nodular opacity abutting the pleura in the superior segment of the right lower lobe measuring 6 x 3 mm. There is a stable nodular opacity abutting the pleura in the superior segment left lower lobe measuring 4 x 3 mm. There is no parenchymal lung edema or consolidation. There is mild scarring in the anterior left base which is stable. There is no pleural effusion or pleural thickening evident.  Upper Abdomen: There is a stable 7 x 7 mm adenoma in the left adrenal gland. Visualized upper abdominal structures otherwise appear unremarkable.  Musculoskeletal: There are no blastic or lytic bone lesions. There is mild degenerative change in the thoracic spine at several levels.  Review of the MIP images confirms the above findings.  IMPRESSION: Stable aneurysmal dilatation of the ascending thoracic aorta with maximum transverse diameter 4.7 x 4.6 cm. Ascending thoracic aortic aneurysm. Recommend semi-annual imaging followup by CTA or MRA and referral to cardiothoracic surgery if not already obtained. This recommendation follows 2010 ACCF/AHA/AATS/ACR/ASA/SCA/SCAI/SIR/STS/SVM Guidelines for the Diagnosis and Management of Patients With Thoracic Aortic Disease. Circulation. 2010; 121: C376-E831. No demonstrable thoracic aortic dissection. Scattered foci of atherosclerotic calcification in the thoracic aorta.  Stable small nodular opacities as noted above. Areas of mild scarring. No lung edema or consolidation. No adenopathy. 7 mm left adrenal adenoma, stable.   Electronically Signed   By: Lowella Grip III M.D.   On: 06/23/2016 14:10 I personally reviewed the CT chest to concur with the findings noted above. The aneurysm is unchanged and there is no sign of recurrence of her lung cancer  Impression: 82 year old woman with a history of stage IA adenocarcinoma resected in 2014 and a 4.8 cm  ascending aneurysm.  Stage IA adenocarcinoma with lepidic spread.- No evidence of recurrence now almost 4 years out from resection. Lifelong nonsmoker.  Ascending aneurysm- 4.8 cm. Unchanged. Growth of more than 5 mm in 6 months or size greater than 5.5 cm with the indications for surgery. The now continued observation and medical management of blood pressure, which is well-controlled.  Plan: Return in 6 months with CT angio of chest  Melrose Nakayama, MD [facility] [phone]

## 2016-09-02 ENCOUNTER — Other Ambulatory Visit: Payer: Self-pay | Admitting: *Deleted

## 2016-09-02 MED ORDER — VERAPAMIL HCL ER 120 MG PO TBCR: 120.0000 mg | EXTENDED_RELEASE_TABLET | Freq: Every day | ORAL | 0 refills | Status: DC

## 2016-11-03 ENCOUNTER — Other Ambulatory Visit: Payer: Self-pay | Admitting: *Deleted

## 2016-11-03 DIAGNOSIS — I712 Thoracic aortic aneurysm, without rupture, unspecified: Secondary | ICD-10-CM

## 2016-11-10 ENCOUNTER — Encounter: Payer: Self-pay | Admitting: *Deleted

## 2016-12-08 ENCOUNTER — Encounter: Payer: Self-pay | Admitting: Cardiovascular Disease

## 2016-12-08 ENCOUNTER — Ambulatory Visit (INDEPENDENT_AMBULATORY_CARE_PROVIDER_SITE_OTHER): Payer: Medicare Other | Admitting: Cardiovascular Disease

## 2016-12-08 VITALS — BP 150/81 | HR 56 | Ht 66.0 in | Wt 144.6 lb

## 2016-12-08 DIAGNOSIS — I712 Thoracic aortic aneurysm, without rupture: Secondary | ICD-10-CM | POA: Diagnosis not present

## 2016-12-08 DIAGNOSIS — I10 Essential (primary) hypertension: Secondary | ICD-10-CM

## 2016-12-08 DIAGNOSIS — E7800 Pure hypercholesterolemia, unspecified: Secondary | ICD-10-CM

## 2016-12-08 DIAGNOSIS — I7121 Aneurysm of the ascending aorta, without rupture: Secondary | ICD-10-CM

## 2016-12-08 MED ORDER — METOPROLOL SUCCINATE ER 25 MG PO TB24: 12.5000 mg | ORAL_TABLET | Freq: Every day | ORAL | 3 refills | Status: DC | PRN

## 2016-12-08 MED ORDER — VERAPAMIL HCL ER 120 MG PO TBCR: 120.0000 mg | EXTENDED_RELEASE_TABLET | Freq: Every day | ORAL | 3 refills | Status: DC

## 2016-12-08 NOTE — Patient Instructions (Signed)

## 2016-12-08 NOTE — Progress Notes (Signed)
Patient ID: Haley [name], female   DOB: Sep 05, 1933, 83 y.o.   MRN: [id number]     HPI: Haley [name] is a 83 y.o. female who presents to the office today for 25 month cardiology evaluation.  Ms. Lorenzetti  has a history of superventricular tachycardia in the past has been treated with verapamil as well as beta blocker therapy. Recently, she has done well and now only takes a beta blocker on an as needed basis and admits to only rarely taking this. She has a remote history of breast cancer. She has a history of hiatal hernia with GERD, hyperlipidemia, and also evidence for descending thoracic aorta that is increased at 4.5 cm on a CT scan suggested fusiform aneurysmal dilatation. On the initial CT in 2011 she was found to have a right upper lung nodule. 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 [name]. She has had subsequent CT scan is for followup.  Her last CT angiogram of her chest was on 10/23/2014 which demonstrated a stable descending thoracic aortic aneurysm measuring 4.8 cm maximally.  Semiannual imaging follow-up I, CTA or MRI was recommended. She denies recent chest pain. She denies tachycardia palpitations. She denies PND or orthopnea. She denies bleeding.  Laboratory by her primary physician in February 2016: Her hemoglobin and hematocrit were stable at 12.8 and 39.4.  Chemistry profile was normal.  Chest normal renal function.  Lipids are excellent with a total cholesterol 165, triglycerides 131, HDL 67, LDL 72.  Since I last saw her in April 2016, she denies any recurrent episodes of chest pain or SVT.  She is followed by [name] for her ascending thoracic aortic aneurysm and is scheduled to undergo a follow-up CT angiogram on Dec 22, 2016.  Her last CT scan in May 2017 revealed that this had increased to 4.7 cm. She states her blood pressure at home typically runs in  the 120 to 6:44 range systolically.  She continues to be on verapamil 120 g daily, and has not required her when necessary metoprolol.  She continues to take atorvastatin 20 mg daily for hyperlipidemia.  I reviewed recent laboratory which was done at [facility] in February 2018.  Total cholesterol was 166, triglycerides 111, HDL 75, LDL 69.  She is normal renal function.  She presents for a two-year follow-up evaluation.  Past Medical History:  Diagnosis Date  . Arthritis    knees and left hip  . Atrophic gastritis without mention of hemorrhage   . Bronchitis, not specified as acute or chronic   . Cancer [facility])    Lung cancer  . Cardiac dysrhythmia, unspecified   . Cataract   . Chronic pancreatitis ([place])   . Complication of anesthesia   . Cough   . Esophageal reflux   . Family history of malignant neoplasm of gastrointestinal tract   . Fatty liver   . Flatulence, eructation, and gas pain   . Gastric erosions    [name]  . Hiatal hernia   . Hoarseness    since virus 2 months agp  . Irritable bowel syndrome   . Irritable bowel syndrome   . LGSIL (low grade squamous intraepithelial dysplasia) 11/15/2014   with biopsy of  benign vaginal cyst  . Malignant neoplasm of breast (female), unspecified site    Skin Cancer legs, face, shoulder, some basal some squamous  . Other chest pain   .  Pneumonia, organism unspecified(486)   . PONV (postoperative nausea and vomiting)    with hemorrhoid surgery years ago, no n/v with breast surgery  . Pure hypercholesterolemia   . Shortness of breath    with Exertion  . Sorethroat    since virus 2 months ago  . Symptomatic menopausal or female climacteric states   . Thoracic aortic aneurysm without rupture ([place]) 08/29/12   08/29/12 discovered on PET/CT  . Tubular adenoma of colon   . Unspecified essential hypertension   . Urinary tract infection, site not specified     Past Surgical History:  Procedure Laterality Date  . BREAST SURGERY    . CATARACT  EXTRACTION     Bilateral  . COLONOSCOPY    . EYE SURGERY    . HEMORROIDECTOMY    . MASTECTOMY, PARTIAL  2004   right   . SKIN CANCER EXCISION    . VIDEO ASSISTED THORACOSCOPY (VATS)/WEDGE RESECTION Left 09/19/2012   Procedure: LEFT VIDEO ASSISTED THORACOSCOPY (VATS)/LEFT UPPER LOBE WEDGE RESECTION & NODE DISSECTION;  Surgeon: Melrose Nakayama, MD;  Location: [place];  Service: Thoracic;  Laterality: Left;    No Known Allergies  Current Outpatient Prescriptions  Medication Sig Dispense Refill  . atorvastatin (LIPITOR) 20 MG tablet Take 1 tablet by mouth  daily 90 tablet 0  . b complex vitamins tablet Take 1 tablet by mouth daily.     . Cholecalciferol (VITAMIN D3) 10000 units TABS Take 1 tablet by mouth daily.    Marland Kitchen co-enzyme Q-10 30 MG capsule Take 300 mg by mouth daily.    . metoprolol succinate (TOPROL-XL) 25 MG 24 hr tablet Take 0.5 tablets (12.5 mg total) by mouth daily as needed. 15 tablet 3  . verapamil (CALAN-SR) 120 MG CR tablet Take 1 tablet (120 mg total) by mouth daily. 90 tablet 3   No current facility-administered medications for this visit.     Social History   Social History  . Marital status: Married    Spouse name: N/A  . Number of children: 2  . Years of education: N/A   Occupational History  . Retired     Tourist information centre manager   Social History Main Topics  . Smoking status: Never Smoker  . Smokeless tobacco: Never Used  . Alcohol use No  . Drug use: No  . Sexual activity: No     Comment: 1st intercourse 36 yo-1 partner   Other Topics Concern  . Not on file   Social History Narrative   Married   Retired   Careers adviser          Family History  Problem Relation Age of Onset  . Colon cancer Father 77  . Cancer Father   . Stroke Father   . Heart disease Mother   . Heart disease Brother   . Cancer Brother        mass behind lung-died age 49  . Multiple myeloma Brother 21       died age 74  . Diabetes Paternal Aunt     ROS General:  Negative; No fevers, chills, or night sweats;  HEENT: Negative; No changes in vision or hearing, sinus congestion, difficulty swallowing Pulmonary: Negative; No cough, wheezing, shortness of breath, hemoptysis Cardiovascular: Negative; No chest pain, presyncope, syncope, palpitations; no recurrent SVT GI: Negative; No nausea, vomiting, diarrhea, or abdominal pain GU: Negative; No dysuria, hematuria, or difficulty voiding Musculoskeletal: Negative; no myalgias, joint pain, or weakness Hematologic/Oncology: Negative; no easy bruising,  bleeding Endocrine: Negative; no heat/cold intolerance; no diabetes Neuro: Negative; no changes in balance, headaches Skin: Negative; No rashes or skin lesions Psychiatric: Negative; No behavioral problems, depression Sleep: Negative; No snoring, daytime sleepiness, hypersomnolence, bruxism, restless legs, hypnogognic hallucinations, no cataplexy Other comprehensive 14 point system review is negative.   PE BP (!) 150/81   Pulse (!) 56   Ht '5\' 6"'  (1.676 m)   Wt 144 lb 9.6 oz (65.6 kg)   BMI 23.34 kg/m    Repeat blood pressure by me was 124/84  Wt Readings from Last 3 Encounters:  12/08/16 144 lb 9.6 oz (65.6 kg)  06/23/16 145 lb (65.8 kg)  04/27/16 142 lb 3.2 oz (64.5 kg)   General: Alert, oriented, no distress.  Skin: normal turgor, no rashes HEENT: Normocephalic, atraumatic. Pupils round and reactive; sclera anicteric;no lid lag.  Nose without nasal septal hypertrophy Mouth/Parynx benign; Mallinpatti scale 2 Neck: No JVD, no carotid briuts Lungs: clear to ausculatation and percussion; no wheezing or rales Heart: RRR, s1 s2 normal 1/6 systolic murmur and faint diastolic murmur in the aortic area Abdomen: soft, nontender; no hepatosplenomehaly, BS+; abdominal aorta nontender and not dilated by palpation. Pulses 2+ Extremities: no clubbing cyanosis or edema, Homan's sign negative  Neurologic: grossly nonfocal  ECG (independently read by me):  Sinus bradycardia 56 bpm.  Mild sinus arrhythmia.  Poor R-wave progression V1 V2 2.  Normal intervals.  April 2016 ECG (independently read by me): Sinus bradycardia at 51 bpm.  Poor progression V1 through V3.  QTc interval 420 ms.  September 2014 ECG: Sinus rhythm 58 beats per minute. Intervals normal.  LABS: I personally review the laboratory done on 09/10/2016 by Harlene Ramus, M.D., done at [facility]  at [place] & Units 09/05/2015 05/20/2015 11/30/2014  Glucose 65 - 99 mg/dL 86 - 90  BUN 7 - 25 mg/dL 14 - 16  Creatinine 0.60 - 0.93 mg/dL 0.80 0.9 0.77  Sodium 135 - 146 mmol/L 140 - 138  Potassium 3.5 - 5.3 mmol/L 4.0 - 4.4  Chloride 98 - 110 mmol/L 106 - 104  CO2 20 - 31 mmol/L 22 - 30  Calcium 8.6 - 10.4 mg/dL 8.9 - 9.0    Hepatic Function Latest Ref Rng & Units 09/05/2015 09/11/2014 08/29/2013  Total Protein 6.1 - 8.1 g/dL 6.7 6.6 7.4  Albumin 3.6 - 5.1 g/dL 3.9 3.9 4.3  AST 10 - 35 U/L '17 14 19  ' ALT 6 - 29 U/L '10 9 10  ' Alk Phosphatase 33 - 130 U/L 59 56 66  Total Bilirubin 0.2 - 1.2 mg/dL 1.1 1.1 1.1    CBC Latest Ref Rng & Units 09/05/2015 09/11/2014 08/29/2013  WBC 4.0 - 10.5 K/uL 3.8(L) 4.7 5.8  Hemoglobin 12.0 - 15.0 g/dL 12.2 12.8 13.0  Hematocrit 36.0 - 46.0 % 36.3 39.4 38.9  Platelets 150 - 400 K/uL 240 294 332   Lab Results  Component Value Date   TSH 4.002 07/05/2012    BNP No results found for: PROBNP  Lipid Panel     Component Value Date/Time   CHOL 150 09/05/2015 0854   TRIG 97 09/05/2015 0854   HDL 77 09/05/2015 0854   CHOLHDL 1.9 09/05/2015 0854   VLDL 19 09/05/2015 0854   LDLCALC 54 09/05/2015 0854     RADIOLOGY: No results found.  IMPRESSION:  1. Thoracic ascending aortic aneurysm ([place])   2. Essential hypertension   3. Pure hypercholesterolemia     ASSESSMENT AND  PLAN: Ms. Cummings is an 83 year old female who continues to do well from a cardiovascular standpoint. She has a history of paroxysmal supraventricular  tachycardia which has been controlled withverapamil and now only rarely takes metoprolol for breakthrough palpitations.  When I last saw her over 2 years ago, I added spironolactone at low-dose to her medical regimen.  She apparently is no longer taking this.  Her blood pressure today on recheck by me was stable at 08/19/1982 on her current dose of verapamil.  I reviewed her CT imaging from May 2017 which showed a fairly stable maximum diameter of her ascending aorta at 4.7 cm.  She will be having a follow-up evaluation several weeks with Dr. Merilynn Finland. She continues to be on atorvastatin 20 mg daily and recent lipid studies were good with her LDL cholesterol at 69.  She is not anemic with her hemoglobin of 13 and hematocrit of 39.0.  Her BMI is excellent at 23.3.  She will continue to monitor her blood pressure.  I discussed with her the goal to keep her blood pressure less than 130 consistently. I will see her one year for reevaluation  Time spent: 25 minutes Troy Sine, MD, [facility]  12/10/2016 8:04 AM

## 2016-12-09 ENCOUNTER — Encounter: Payer: Self-pay | Admitting: Gynecology

## 2016-12-14 ENCOUNTER — Encounter: Payer: Self-pay | Admitting: Internal Medicine

## 2016-12-22 ENCOUNTER — Ambulatory Visit (INDEPENDENT_AMBULATORY_CARE_PROVIDER_SITE_OTHER): Payer: Medicare Other | Admitting: Thoracic Surgery (Cardiothoracic Vascular Surgery)

## 2016-12-22 ENCOUNTER — Encounter: Payer: Self-pay | Admitting: Thoracic Surgery (Cardiothoracic Vascular Surgery)

## 2016-12-22 ENCOUNTER — Ambulatory Visit
Admission: RE | Admit: 2016-12-22 | Discharge: 2016-12-22 | Disposition: A | Discharge status: Home / Self Care | Payer: Medicare Other | Source: Ambulatory Visit | Attending: Thoracic Surgery (Cardiothoracic Vascular Surgery) | Admitting: Thoracic Surgery (Cardiothoracic Vascular Surgery)

## 2016-12-22 VITALS — BP 125/75 | HR 63 | Resp 20 | Ht 66.0 in | Wt 142.0 lb

## 2016-12-22 DIAGNOSIS — Z09 Encounter for follow-up examination after completed treatment for conditions other than malignant neoplasm: Secondary | ICD-10-CM | POA: Diagnosis not present

## 2016-12-22 DIAGNOSIS — C3412 Malignant neoplasm of upper lobe, left bronchus or lung: Secondary | ICD-10-CM

## 2016-12-22 DIAGNOSIS — I7121 Aneurysm of the ascending aorta, without rupture: Secondary | ICD-10-CM

## 2016-12-22 DIAGNOSIS — I712 Thoracic aortic aneurysm, without rupture, unspecified: Secondary | ICD-10-CM

## 2016-12-22 MED ORDER — IOPAMIDOL (ISOVUE-370) INJECTION 76%
75.0000 mL | Freq: Once | INTRAVENOUS | Status: AC | PRN
  Administered 2016-12-22: 75 mL via INTRAVENOUS

## 2016-12-22 NOTE — Progress Notes (Signed)
[address]       ,[place] [address]             [phone]    HPI: Haley [name] returns for a scheduled follow-up visit.  She is a 83 year old woman who had a stage I adenocarcinoma with lepidic spread resected with a wedge resection in February 2014. She is a lifelong nonsmoker. She was found to have a 4.5 cm ascending aneurysm in 2011. It was 4.7 cm on her last scan in November 2017.  Since her last visit November 2017 she has been feeling well. She's been having problems with allergies, mainly eye irritation, over the past few weeks. She has not had any unusual cough or hemoptysis. She denies change in appetite, weight loss, headaches, visual changes.  Past Medical History:  Diagnosis Date  . Arthritis    knees and left hip  . Atrophic gastritis without mention of hemorrhage   . Bronchitis, not specified as acute or chronic   . Cancer [facility])    Lung cancer  . Cardiac dysrhythmia, unspecified   . Cataract   . Chronic pancreatitis ([place])   . Complication of anesthesia   . Cough   . Esophageal reflux   . Family history of malignant neoplasm of gastrointestinal tract   . Fatty liver   . Flatulence, eructation, and gas pain   . Gastric erosions    [name]  . Hiatal hernia   . Hoarseness    since virus 2 months agp  . Irritable bowel syndrome   . Irritable bowel syndrome   . LGSIL (low grade squamous intraepithelial dysplasia) 11/15/2014   with biopsy of  benign vaginal cyst  . Malignant neoplasm of breast (female), unspecified site    Skin Cancer legs, face, shoulder, some basal some squamous  . Other chest pain   . Pneumonia, organism unspecified(486)   . PONV (postoperative nausea and vomiting)    with hemorrhoid surgery years ago, no n/v with breast surgery  . Pure hypercholesterolemia   . Shortness of breath    with Exertion  . Sorethroat    since virus 2 months ago  . Symptomatic menopausal or female climacteric states   . Thoracic aortic  aneurysm without rupture ([place]) 08/29/12   08/29/12 discovered on PET/CT  . Tubular adenoma of colon   . Unspecified essential hypertension   . Urinary tract infection, site not specified      Current Outpatient Prescriptions  Medication Sig Dispense Refill  . atorvastatin (LIPITOR) 20 MG tablet Take 1 tablet by mouth  daily 90 tablet 0  . b complex vitamins tablet Take 1 tablet by mouth daily.     . Cholecalciferol (VITAMIN D3) 10000 units TABS Take 1 tablet by mouth daily.    Marland Kitchen co-enzyme Q-10 30 MG capsule Take 300 mg by mouth daily.    . metoprolol succinate (TOPROL-XL) 25 MG 24 hr tablet Take 0.5 tablets (12.5 mg total) by mouth daily as needed. 15 tablet 3  . verapamil (CALAN-SR) 120 MG CR tablet Take 1 tablet (120 mg total) by mouth daily. 90 tablet 3   No current facility-administered medications for this visit.     Physical Exam BP 125/75   Pulse 63   Resp 20   Ht 5\' 6"  (1.676 m)   Wt 142 lb (64.4 kg)   SpO2 98%   BMI 22.30 kg/m  83 year old woman in no acute distress Alert and oriented 3 with no focal deficits  No cervical or subclavicular adenopathy Lungs clear with breath sounds bilaterally Cardiac regular rate and rhythm normal S1 and S2 no murmur  Diagnostic Tests: CT ANGIOGRAPHY CHEST WITH CONTRAST  TECHNIQUE: Multidetector CT imaging of the chest was performed using the standard protocol during bolus administration of intravenous contrast. Multiplanar CT image reconstructions and MIPs were obtained to evaluate the vascular anatomy.  CONTRAST:  75 mL of Isovue 370 intravenously.  COMPARISON:  CT scan of June 23, 2016.  FINDINGS: Cardiovascular: Stable 4.7 cm ascending thoracic aortic aneurysm is noted. Transverse aortic arch measures 3 cm in diameter which is within normal limits. Proximal descending thoracic aorta measures 2.8 cm. No dissection is noted. Atherosclerosis of descending thoracic aorta is noted. Great vessels are widely patent  without significant stenosis. No pericardial effusion is noted.  Mediastinum/Nodes: No enlarged mediastinal, hilar, or axillary lymph nodes. Thyroid gland, trachea, and esophagus demonstrate no significant findings.  Lungs/Pleura: No pneumothorax or pleural effusion is noted. Stable right apical scarring is noted. Stable left upper lobe scarring is noted. New 4 mm nodule is noted posteriorly in the left lower lobe best seen on image number 78 of series 4.  Upper Abdomen: No acute abnormality.  Musculoskeletal: No chest wall abnormality. No acute or significant osseous findings.  Review of the MIP images confirms the above findings.  IMPRESSION: Stable 4.7 cm ascending thoracic aortic aneurysm. Recommend semi-annual imaging followup by CTA or MRA and referral to cardiothoracic surgery if not already obtained. This recommendation follows 2010 ACCF/AHA/AATS/ACR/ASA/SCA/SCAI/SIR/STS/SVM Guidelines for the Diagnosis and Management of Patients With Thoracic Aortic Disease. Circulation. 2010; 121: Z601-U932.  New 4 mm nodule seen in left lower lobe. No follow-up needed if patient is low-risk. Non-contrast chest CT can be considered in 12 months if patient is high-risk. This recommendation follows the consensus statement: Guidelines for Management of Incidental Pulmonary Nodules Detected on CT Images: From the Fleischner Society 2017; Radiology 2017; 284:228-243.   Electronically Signed   By: Marijo Conception, M.D.   On: 12/22/2016 09:10 I personally reviewed the CT chest confirmed the findings noted above  Impression: Haley [name] is an 83 year old woman who had a wedge resection for a stage IA adenocarcinoma with lepidic spread in 2014. She now is 4 years out from surgery. Today's CT shows a new 4 mm nodule in the left lower lobe. She is a lifelong nonsmoker, but given her history this needs close follow-up.  Ascending aneurysm- stable at 4.7 cm. Needs continued follow-up  at six-month intervals. Blood pressure well controlled.  Plan: Return in 6 months with chest CT to follow-up ascending aneurysm and lung nodule.  Melrose Nakayama, MD [facility] [phone]

## 2017-01-06 ENCOUNTER — Other Ambulatory Visit: Payer: Self-pay | Admitting: Family Medicine

## 2017-01-06 DIAGNOSIS — Z1231 Encounter for screening mammogram for malignant neoplasm of breast: Secondary | ICD-10-CM

## 2017-02-10 ENCOUNTER — Encounter: Payer: Self-pay | Admitting: Internal Medicine

## 2017-02-10 ENCOUNTER — Ambulatory Visit (INDEPENDENT_AMBULATORY_CARE_PROVIDER_SITE_OTHER): Payer: Medicare Other | Admitting: Internal Medicine

## 2017-02-10 VITALS — BP 118/82 | HR 58 | Ht 66.0 in | Wt 140.6 lb

## 2017-02-10 DIAGNOSIS — K219 Gastro-esophageal reflux disease without esophagitis: Secondary | ICD-10-CM | POA: Diagnosis not present

## 2017-02-10 DIAGNOSIS — Z800 Family history of malignant neoplasm of digestive organs: Secondary | ICD-10-CM | POA: Diagnosis not present

## 2017-02-10 DIAGNOSIS — Z86010 Personal history of colonic polyps: Secondary | ICD-10-CM

## 2017-02-10 MED ORDER — NA SULFATE-K SULFATE-MG SULF 17.5-3.13-1.6 GM/177ML PO SOLN: ORAL | 0 refills | Status: DC

## 2017-02-10 NOTE — Progress Notes (Signed)
Subjective:    Patient ID: Haley [name], female    DOB: 1934-06-20, 83 y.o.   MRN: [id number]  HPI Haley [name] is an 83 year old female with a history of GERD with esophageal dysmotility, adenomatous colon polyps, family history of colon cancer in her father, history of adenocarcinoma of the lung status post resection, breast cancer in remission, who is here for follow-up. I saw her on 11/19/2014 and we discussed GERD, IBS along with her history of polyps. She returns to discuss surveillance colonoscopy. She is here alone today.  She reports that she is feeling well. She will occasionally have some issues with reflux but she denies dysphagia and odynophagia. No nausea or vomiting. She watches her diet and tries to avoid acidic type and fried foods. With this her reflux she states is minimal. She will occasionally use Tums. In the past she is also used Zantac with good benefit. She reports regular bowel movements though occasionally stools can be loose. She denies blood in her stool or melena. She denies abdominal pain. Reports good appetite with stable weight. Denies chest pain and shortness of breath. Denies lower extremity edema. She does have arthritis symptoms in her knees and left hip but overall she feels manages these well. She takes minimal medications which include atorvastatin, metoprolol and verapamil. She takes a probiotic, vitamin D 3, B complex vitamin and coenzyme Q10.  Her last colonoscopy was 5 years ago with Verl Blalock. This showed a 6 mm to her adenoma removed from the cecum. Review of Systems  as per history of present illness, otherwise negative  Current Medications, Allergies, Past Medical History, Past Surgical History, Family History and Social History were reviewed in [employer] record.     Objective:   Physical Exam BP 118/82 (BP Location: Left Arm, Patient Position: Sitting, Cuff Size: Normal)   Pulse (!) 58   Ht 5\' 6"  (1.676 m)    Wt 140 lb 9.6 oz (63.8 kg)   SpO2 97%   BMI 22.69 kg/m  Constitutional: Well-developed and well-nourished. No distress. HEENT: Normocephalic and atraumatic. Oropharynx is clear and moist. Conjunctivae are normal.  No scleral icterus. Neck: Neck supple. Trachea midline. Cardiovascular: Normal rate, regular rhythm and intact distal pulses.  Pulmonary/chest: Effort normal and breath sounds normal. No wheezing, rales or rhonchi. Abdominal: Soft, nontender, nondistended. Bowel sounds active throughout. There are no masses palpable. No hepatosplenomegaly. Extremities: no clubbing, cyanosis, or edema Neurological: Alert and oriented to person place and time. Skin: Skin is warm and dry. Psychiatric: Normal mood and affect. Behavior is normal.      Assessment & Plan:  83 year old female with a history of GERD with esophageal dysmotility, adenomatous colon polyps, family history of colon cancer in her father, history of adenocarcinoma of the lung status post resection, breast cancer in remission, who is here for follow-up.  1. Personal history of colon polyps/family history of colon cancer -- she is doing very well overall. We spent time today discussing surveillance colonoscopy versus discontinuation of surveillance paced on her age of 33 years. After this thorough discussion she is leaning towards having a repeat colonoscopy. I feel that this is a very reasonable choice. We reviewed the risks, benefits and alternatives she is agreeable and wishes to proceed. Colonoscopy for surveillance of polyps and family history of colon cancer will be scheduled in the [place].  2. GERD -- intermittent and managed mostly by diet modification. She can continue to use Tums or Zantac per box instruction  as needed. She is happy with this plan  25 minutes spent with the patient today. Greater than 50% was spent in counseling and coordination of care with the patient

## 2017-02-10 NOTE — Patient Instructions (Signed)
You have been scheduled for a colonoscopy. Please follow written instructions given to you at your visit today.  Please pick up your prep supplies at the pharmacy within the next 1-3 days. If you use inhalers (even only as needed), please bring them with you on the day of your procedure. Your physician has requested that you go to www.startemmi.com and enter the access code given to you at your visit today. This web site gives a general overview about your procedure. However, you should still follow specific instructions given to you by our office regarding your preparation for the procedure.  Please purchase the following medications over the counter and take as directed: Tums OR Zantac as needed for reflux  If you are age 50 or older, your body mass index should be between 23-30. Your Body mass index is 22.69 kg/m. If this is out of the aforementioned range listed, please consider follow up with your Primary Care Provider.  If you are age 30 or younger, your body mass index should be between 19-25. Your Body mass index is 22.69 kg/m. If this is out of the aformentioned range listed, please consider follow up with your Primary Care Provider.

## 2017-02-22 ENCOUNTER — Inpatient Hospital Stay: Admission: RE | Admit: 2017-02-22 | Payer: Medicare Other | Source: Ambulatory Visit

## 2017-03-02 ENCOUNTER — Ambulatory Visit
Admission: RE | Admit: 2017-03-02 | Discharge: 2017-03-02 | Disposition: A | Discharge status: Home / Self Care | Payer: Medicare Other | Source: Ambulatory Visit | Attending: Family Medicine | Admitting: Family Medicine

## 2017-03-02 DIAGNOSIS — Z1231 Encounter for screening mammogram for malignant neoplasm of breast: Secondary | ICD-10-CM

## 2017-03-02 HISTORY — DX: Personal history of irradiation: Z92.3

## 2017-03-02 HISTORY — DX: Malignant neoplasm of unspecified site of unspecified female breast: C50.919

## 2017-04-06 ENCOUNTER — Ambulatory Visit (AMBULATORY_SURGERY_CENTER): Payer: Medicare Other | Admitting: Internal Medicine

## 2017-04-06 ENCOUNTER — Encounter: Payer: Self-pay | Admitting: Internal Medicine

## 2017-04-06 VITALS — BP 143/71 | HR 52 | Temp 97.5°F | Resp 10 | Ht 66.0 in | Wt 140.0 lb

## 2017-04-06 DIAGNOSIS — Z800 Family history of malignant neoplasm of digestive organs: Secondary | ICD-10-CM | POA: Diagnosis not present

## 2017-04-06 DIAGNOSIS — Z86010 Personal history of colonic polyps: Secondary | ICD-10-CM | POA: Diagnosis present

## 2017-04-06 DIAGNOSIS — K635 Polyp of colon: Secondary | ICD-10-CM | POA: Diagnosis not present

## 2017-04-06 DIAGNOSIS — D126 Benign neoplasm of colon, unspecified: Secondary | ICD-10-CM | POA: Diagnosis not present

## 2017-04-06 DIAGNOSIS — D123 Benign neoplasm of transverse colon: Secondary | ICD-10-CM

## 2017-04-06 DIAGNOSIS — D120 Benign neoplasm of cecum: Secondary | ICD-10-CM

## 2017-04-06 MED ORDER — SODIUM CHLORIDE 0.9 % IV SOLN
500.0000 mL | INTRAVENOUS | Status: DC
Start: 2017-04-06 — End: 2017-04-06

## 2017-04-06 NOTE — Progress Notes (Signed)
Report to PACU, RN, vss, BBS= Clear.  

## 2017-04-06 NOTE — Progress Notes (Signed)
Pt's states no medical or surgical changes since previsit or office visit. 

## 2017-04-06 NOTE — Op Note (Signed)
[facility] Patient Name: Haley [name] Procedure Date: 04/06/2017 1:36 PM MRN: [id number] Endoscopist: Jerene Bears , MD Age: 83 Referring MD:  Date of Birth: September 18, 1933 Gender: Female Account #: [id number] Procedure:                Colonoscopy Indications:              Surveillance: Personal history of adenomatous                            polyps on last colonoscopy 5 years ago, Family                            history of colon cancer in a first-degree relative Medicines:                Monitored Anesthesia Care Procedure:                Pre-Anesthesia Assessment:                           - Prior to the procedure, a History and Physical                            was performed, and patient medications and                            allergies were reviewed. The patient's tolerance of                            previous anesthesia was also reviewed. The risks                            and benefits of the procedure and the sedation                            options and risks were discussed with the patient.                            All questions were answered, and informed consent                            was obtained. Prior Anticoagulants: The patient has                            taken no previous anticoagulant or antiplatelet                            agents. ASA Grade Assessment: II - A patient with                            mild systemic disease. After reviewing the risks                            and benefits, the patient was deemed in  satisfactory condition to undergo the procedure.                           After obtaining informed consent, the colonoscope                            was passed under direct vision. Throughout the                            procedure, the patient's blood pressure, pulse, and                            oxygen saturations were monitored continuously. The                            Model PCF-[id number]  [id number]) scope was introduced                            through the anus and advanced to the the cecum,                            identified by appendiceal orifice and ileocecal                            valve. The colonoscopy was performed without                            difficulty. The patient tolerated the procedure                            well. The quality of the bowel preparation was                            good. The ileocecal valve, appendiceal orifice, and                            rectum were photographed. Scope In: 1:43:52 PM Scope Out: 1:54:27 PM Scope Withdrawal Time: 0 hours 7 minutes 18 seconds  Total Procedure Duration: 0 hours 10 minutes 35 seconds  Findings:                 The digital rectal exam was normal.                           Two sessile polyps were found in the proximal                            transverse colon and cecum. The polyps were 3 to 5                            mm in size. These polyps were removed with a cold                            snare. Resection and retrieval were complete.  Internal hemorrhoids were found during                            retroflexion. The hemorrhoids were small.                           The exam was otherwise without abnormality. Complications:            No immediate complications. Estimated Blood Loss:     Estimated blood loss was minimal. Impression:               - Two 3 to 5 mm polyps in the proximal transverse                            colon and in the cecum, removed with a cold snare.                            Resected and retrieved.                           - Internal hemorrhoids.                           - The examination was otherwise normal. Recommendation:           - Patient has a contact number available for                            emergencies. The signs and symptoms of potential                            delayed complications were discussed with the                             patient. Return to normal activities tomorrow.                            Written discharge instructions were provided to the                            patient.                           - Resume previous diet.                           - Continue present medications.                           - Await pathology results.                           - No recommendation at this time regarding repeat                            colonoscopy due to age. Jerene Bears, MD 04/06/2017 1:58:40 PM This report has been signed electronically.

## 2017-04-06 NOTE — Progress Notes (Signed)
Called to room to assist during endoscopic procedure.  Patient ID and intended procedure confirmed with present staff. Received instructions for my participation in the procedure from the performing physician.  

## 2017-04-06 NOTE — Patient Instructions (Signed)
**  Handouts given on polyps and hemorrhoids**   YOU HAD AN ENDOSCOPIC PROCEDURE TODAY: Refer to the procedure report and other information in the discharge instructions given to you for any specific questions about what was found during the examination. If this information does not answer your questions, please call [facility] office at [phone] to clarify.   YOU SHOULD EXPECT: Some feelings of bloating in the abdomen. Passage of more gas than usual. Walking can help get rid of the air that was put into your GI tract during the procedure and reduce the bloating. If you had a lower endoscopy (such as a colonoscopy or flexible sigmoidoscopy) you may notice spotting of blood in your stool or on the toilet paper. Some abdominal soreness may be present for a day or two, also.  DIET: Your first meal following the procedure should be a light meal and then it is ok to progress to your normal diet. A half-sandwich or bowl of soup is an example of a good first meal. Heavy or fried foods are harder to digest and may make you feel nauseous or bloated. Drink plenty of fluids but you should avoid alcoholic beverages for 24 hours. If you had a esophageal dilation, please see attached instructions for diet.    ACTIVITY: Your care partner should take you home directly after the procedure. You should plan to take it easy, moving slowly for the rest of the day. You can resume normal activity the day after the procedure however YOU SHOULD NOT DRIVE, use power tools, machinery or perform tasks that involve climbing or major physical exertion for 24 hours (because of the sedation medicines used during the test).   SYMPTOMS TO REPORT IMMEDIATELY: A gastroenterologist can be reached at any hour. Please call [phone]  for any of the following symptoms:  Following lower endoscopy (colonoscopy, flexible sigmoidoscopy) Excessive amounts of blood in the stool  Significant tenderness, worsening of abdominal pains  Swelling of  the abdomen that is new, acute  Fever of 100 or higher    FOLLOW UP:  If any biopsies were taken you will be contacted by phone or by letter within the next 1-3 weeks. Call [phone]  if you have not heard about the biopsies in 3 weeks.  Please also call with any specific questions about appointments or follow up tests.

## 2017-04-07 ENCOUNTER — Telehealth: Payer: Self-pay | Admitting: *Deleted

## 2017-04-07 NOTE — Telephone Encounter (Signed)
No message left per instructions,went to voice mail.

## 2017-04-07 NOTE — Telephone Encounter (Signed)
No answer. Did not leave message per pt. Request.

## 2017-04-15 ENCOUNTER — Encounter: Payer: Self-pay | Admitting: Internal Medicine

## 2017-05-19 ENCOUNTER — Other Ambulatory Visit: Payer: Self-pay | Admitting: *Deleted

## 2017-05-19 DIAGNOSIS — I712 Thoracic aortic aneurysm, without rupture, unspecified: Secondary | ICD-10-CM

## 2017-06-15 ENCOUNTER — Encounter: Payer: Self-pay | Admitting: Thoracic Surgery (Cardiothoracic Vascular Surgery)

## 2017-06-15 ENCOUNTER — Ambulatory Visit: Payer: Medicare Other | Admitting: Thoracic Surgery (Cardiothoracic Vascular Surgery)

## 2017-06-15 ENCOUNTER — Ambulatory Visit
Admission: RE | Admit: 2017-06-15 | Discharge: 2017-06-15 | Disposition: A | Discharge status: Home / Self Care | Payer: Medicare Other | Source: Ambulatory Visit | Attending: Thoracic Surgery (Cardiothoracic Vascular Surgery) | Admitting: Thoracic Surgery (Cardiothoracic Vascular Surgery)

## 2017-06-15 VITALS — BP 130/80 | HR 60 | Resp 18 | Ht 66.0 in | Wt 145.0 lb

## 2017-06-15 DIAGNOSIS — I712 Thoracic aortic aneurysm, without rupture, unspecified: Secondary | ICD-10-CM

## 2017-06-15 DIAGNOSIS — C3412 Malignant neoplasm of upper lobe, left bronchus or lung: Secondary | ICD-10-CM

## 2017-06-15 DIAGNOSIS — I7121 Aneurysm of the ascending aorta, without rupture: Secondary | ICD-10-CM

## 2017-06-15 NOTE — Progress Notes (Signed)
[address]       [place] [address]             [phone]    HPI: Mrs. Nanninga returns for scheduled follow-up visit  She is an 83 year old woman who had a wedge resection for a stage I adenocarcinoma with lepidic spread in February 2014.  She is a lifelong non-smoker.  In 2011 she was noted to have a 4.5 cm a sending aneurysm.  Her most recent scan was in May of this year.  The aneurysm measured 4.7 cm.  She has been feeling well.  She has not had any change in appetite or weight loss.  She denies any chest pain or shortness of breath.   Past Medical History:  Diagnosis Date  . Arthritis    knees and left hip  . Atrophic gastritis without mention of hemorrhage   . Breast cancer ([place])   . Bronchitis, not specified as acute or chronic   . Cancer [facility])    Lung cancer  . Cardiac dysrhythmia, unspecified   . Cataract   . Chronic pancreatitis ([place])   . Complication of anesthesia   . Cough   . Esophageal reflux   . Family history of malignant neoplasm of gastrointestinal tract   . Fatty liver   . Flatulence, eructation, and gas pain   . Gastric erosions    [name]  . Hiatal hernia   . Hoarseness    since virus 2 months agp  . Irritable bowel syndrome   . Irritable bowel syndrome   . LGSIL (low grade squamous intraepithelial dysplasia) 11/15/2014   with biopsy of  benign vaginal cyst  . Malignant neoplasm of breast (female), unspecified site    Skin Cancer legs, face, shoulder, some basal some squamous  . Other chest pain   . Personal history of radiation therapy   . Pneumonia, organism unspecified(486)   . PONV (postoperative nausea and vomiting)    with hemorrhoid surgery years ago, no n/v with breast surgery  . Pure hypercholesterolemia   . Shortness of breath    with Exertion  . Sorethroat    since virus 2 months ago  . Symptomatic menopausal or female climacteric states   . Thoracic aortic aneurysm without rupture ([place]) 08/29/12   08/29/12  discovered on PET/CT  . Tubular adenoma of colon   . Unspecified essential hypertension   . Urinary tract infection, site not specified     Current Outpatient Medications  Medication Sig Dispense Refill  . atorvastatin (LIPITOR) 20 MG tablet Take 1 tablet by mouth  daily 90 tablet 0  . b complex vitamins tablet Take 1 tablet by mouth daily.     . Cholecalciferol (VITAMIN D3) 10000 units TABS Take 1 tablet by mouth daily.    Marland Kitchen co-enzyme Q-10 30 MG capsule Take 300 mg by mouth daily.    . metoprolol succinate (TOPROL-XL) 25 MG 24 hr tablet Take 0.5 tablets (12.5 mg total) by mouth daily as needed. 15 tablet 3  . Probiotic Product (PROBIOTIC DAILY PO) Take by mouth.    . verapamil (CALAN-SR) 120 MG CR tablet Take 1 tablet (120 mg total) by mouth daily. 90 tablet 3   No current facility-administered medications for this visit.     Physical Exam BP 130/80   Pulse 60   Resp 18   Ht 5\' 6"  (1.676 m)   Wt 145 lb (65.8 kg)   SpO2 98% Comment: RA  BMI  23.9 kg/m  83 year old woman in no acute distress Alert and oriented x3 with no focal neurologic deficits Lungs clear with equal breath sounds bilaterally No cervical or supraclavicular adenopathy Cardiac regular rate and rhythm normal S1-S2  Diagnostic Tests: CT CHEST WITHOUT CONTRAST  TECHNIQUE: Multidetector CT imaging of the chest was performed following the standard protocol without IV contrast.  COMPARISON:  CT scan of Dec 22, 2016.  FINDINGS: Cardiovascular: Ascending thoracic aortic aneurysm is noted with maximum measured diameter 4.8 cm which is not significantly changed compared to prior exam. Transverse aortic arch measures 3.4 cm. Proximal descending thoracic aorta measures 2.9 cm. Atherosclerosis is noted. Normal cardiac size. No pericardial effusion is noted.  Mediastinum/Nodes: No enlarged mediastinal or axillary lymph nodes. Thyroid gland, trachea, and esophagus demonstrate no  significant findings.  Lungs/Pleura: No pneumothorax or pleural effusion is noted. Stable bilateral upper lobe scarring is noted. 4 mm nodule noted in left lower lobe is no longer visualized. No acute abnormality is noted in the lung parenchyma.  Upper Abdomen: No acute abnormality.  Musculoskeletal: No chest wall mass or suspicious bone lesions identified.  IMPRESSION: 4.8 cm ascending thoracic aortic aneurysm which is grossly stable compared to prior exam. Recommend semi-annual imaging followup by CTA or MRA and referral to cardiothoracic surgery if not already obtained. This recommendation follows 2010 ACCF/AHA/AATS/ACR/ASA/SCA/SCAI/SIR/STS/SVM Guidelines for the Diagnosis and Management of Patients With Thoracic Aortic Disease. Circulation. 2010; 121: U932-T557.  Left lower lobe pulmonary nodule noted on prior exam is no longer present.  Aortic Atherosclerosis (ICD10-I70.0).   Electronically Signed   By: Marijo Conception, M.D.   On: 06/15/2017 12:08 I personally reviewed the CT chest and concur with the findings noted above  Impression: Mrs. Irby is an 83 year old woman who I followed for previous stage Ia adenocarcinoma and an ascending aneurysm.  Stage IA adenocarcinoma with lepidic spread-no evidence of recurrent disease at 4 years.  Ascending aneurysm-measures 4.8 cm in diameter.  There is no indication for surgery at this time.  Needs continued semiannual follow-up.  Blood pressure is reasonably well controlled.  Plan:  Return in 6 months with CT Angiogram of chest  Melrose Nakayama, MD [facility] [phone]

## 2017-10-13 ENCOUNTER — Telehealth: Payer: Self-pay | Admitting: Cardiovascular Disease

## 2017-10-13 NOTE — Telephone Encounter (Signed)
Returned call to patient.She stated when she woke up this morning she felt light headed and dizzy.B/P 110/78,87/60,81/60 pulse 101,109.Stated she took Metoprolol 12.5 mg.She is feeling better at present B/P 108/85 pulse 109.No chest pain.No sob.No dizziness.Stated she only takes Metoprolol 12.5 mg daily as needed and this is first time she has taken one in almost 1 year.Appointment scheduled with [name] DNP 10/21/17 at 10:30 am.Advised to continue to monitor B/P and pulse.Call back if sooner if needed.

## 2017-10-13 NOTE — Telephone Encounter (Signed)
New Message:   Pt is having BP issues 81/61 @ 8:15 am and is experiencing some dizziness and pulse rate was 101. Pt was having the same issues on yesterday and it has only been going on for the last couple of days.

## 2017-10-20 NOTE — Progress Notes (Signed)
Cardiology Office Note   Date:  10/21/2017   ID:  Haley [name], Haley [name] 1934-03-20, MRN [id number]  PCP:  Haley Nip, MD  Cardiologist:  Dr. Claiborne [name] Chief Complaint  Patient presents with  . Tachycardia  . Hypotension     History of Present Illness: Haley [name] is a 84 y.o. female who presents for ongoing assessment and management of SVT, on beta-blocker therapy along with verapamil, ascending thoracic aortic aneurysm, hypercholesterolemia, and hypertension he is followed by Haley [name] for AAA ongoing annual appointments  She is here today with worsening tachycardia with hypotension at home. She brings with her a copy of her. Her BP's have been as low as 87/61 but has gone up to 146/82.  She she states that she was lying on the couch when she felt her HR go up, pounding, Lasting about 15 minutes. She took a 12.5 mg metoprolol and after another 20 minutes her HR improved, but BP dropped very low. This occurred a second time last week, but did not last as long. She did not take a metoprolol. She has noticed that her BP has been low with associated dizziness.   Past Medical History:  Diagnosis Date  . Arthritis    knees and left hip  . Atrophic gastritis without mention of hemorrhage   . Breast cancer ([place])   . Bronchitis, not specified as acute or chronic   . Cancer [facility])    Lung cancer  . Cardiac dysrhythmia, unspecified   . Cataract   . Chronic pancreatitis ([place])   . Complication of anesthesia   . Cough   . Esophageal reflux   . Family history of malignant neoplasm of gastrointestinal tract   . Fatty liver   . Flatulence, eructation, and gas pain   . Gastric erosions    Haley [name]  . Hiatal hernia   . Hoarseness    since virus 2 months agp  . Irritable bowel syndrome   . Irritable bowel syndrome   . LGSIL (low grade squamous intraepithelial dysplasia) 11/15/2014   with biopsy of  benign vaginal cyst  . Malignant neoplasm of breast (female), unspecified site    Skin Cancer legs, face, shoulder, some basal some squamous  . Other chest pain   . Personal history of radiation therapy   . Pneumonia, organism unspecified(486)   . PONV (postoperative nausea and vomiting)    with hemorrhoid surgery years ago, no n/v with breast surgery  . Pure hypercholesterolemia   . Shortness of breath    with Exertion  . Sorethroat    since virus 2 months ago  . Symptomatic menopausal or female climacteric states   . Thoracic aortic aneurysm without rupture ([place]) 08/29/12   08/29/12 discovered on PET/CT  . Tubular adenoma of colon   . Unspecified essential hypertension   . Urinary tract infection, site not specified     Past Surgical History:  Procedure Laterality Date  . BREAST LUMPECTOMY    . BREAST SURGERY    . CATARACT EXTRACTION     Bilateral  . COLONOSCOPY    . EYE SURGERY    . HEMORROIDECTOMY    . SKIN CANCER EXCISION    . VIDEO ASSISTED THORACOSCOPY (VATS)/WEDGE RESECTION Left 09/19/2012   Procedure: LEFT VIDEO ASSISTED THORACOSCOPY (VATS)/LEFT UPPER LOBE WEDGE RESECTION & NODE DISSECTION;  Surgeon: Haley Nakayama, MD;  Location: [place];  Service: Thoracic;  Laterality: Left;     Current Outpatient Medications  Medication Sig Dispense Refill  .  atorvastatin (LIPITOR) 20 MG tablet Take 1 tablet by mouth  daily 90 tablet 0  . b complex vitamins tablet Take 1 tablet by mouth daily.     . Cholecalciferol (VITAMIN D3) 10000 units TABS Take 1 tablet by mouth daily.    Marland Kitchen co-enzyme Q-10 30 MG capsule Take 300 mg by mouth daily.    . metoprolol succinate (TOPROL-XL) 25 MG 24 hr tablet Take 0.5 tablets (12.5 mg total) by mouth daily as needed. 15 tablet 3  . Probiotic Product (PROBIOTIC DAILY PO) Take by mouth.    . verapamil (CALAN-SR) 180 MG CR tablet Take 0.5 tablets (90 mg total) by mouth daily. 15 tablet 3   No current facility-administered medications for this visit.     Allergies:   Patient has no known allergies.    Social History:  The  patient  reports that she has never smoked. She has never used smokeless tobacco. She reports that she does not drink alcohol or use drugs.   Family History:  The patient's family history includes Cancer in her brother and father; Colon cancer (age of onset: 66) in her father; Diabetes in her paternal aunt; Heart disease in her brother and mother; Multiple myeloma (age of onset: 48) in her brother; Stroke in her father.    ROS: All other systems are reviewed and negative. Unless otherwise mentioned in H&P    PHYSICAL EXAM: VS:  BP 138/82   Pulse (!) 51   Ht '5\' 6"'  (1.676 m)   Wt 141 lb (64 kg)   BMI 22.76 kg/m  , BMI Body mass index is 22.76 kg/m. GEN: Well nourished, well developed, in no acute distress  HEENT: normal  Neck: no JVD, carotid bruits, or masses Cardiac: RRR; 1/6 systolic murmurs, rubs, or gallops,no edema  Respiratory:  clear to auscultation bilaterally, normal work of breathing GI: soft, nontender, nondistended, + BS MS: no deformity or atrophy  Skin: warm and dry, no rash Neuro:  Strength and sensation are intact Psych: euthymic mood, full affect   EKG:  Sinus bradycardia rate of 51 bpm, with lst degree AV block, and sinus arrhythmia and PVC's.    Recent Labs: No results found for requested labs within last 8760 hours.    Lipid Panel    Component Value Date/Time   CHOL 150 09/05/2015 0854   TRIG 97 09/05/2015 0854   HDL 77 09/05/2015 0854   CHOLHDL 1.9 09/05/2015 0854   VLDL 19 09/05/2015 0854   LDLCALC 54 09/05/2015 0854      Wt Readings from Last 3 Encounters:  10/21/17 141 lb (64 kg)  06/15/17 145 lb (65.8 kg)  04/06/17 140 lb (63.5 kg)    ASSESSMENT AND PLAN:  1. Bradycardia with episodes of heart racing: She has had two episodes of racing HR with associated dizziness, followed by hypotension and weakness after taking prn metoprolol 12.5 mg. I will place a 30-day cardiac monitor to evaluate for tachy-brady syndrome. Due to hypotension, I will  decrease verapamil to 90 mg from 120 mg. She is to continue to take metoprolol PRN, for heart racing. I have explained to her about the cardiac monitor and need to wear for 30 days, Also to notify us if she is having worsening symptoms.   2. Hypertension: She is having periods of hypotension. Dose of verapamil is decreased as above. Continue to record BP's at home.   3. Hypercholesterolemia: Followed by PCP. Continue atorvastatin.    Current medicines are reviewed at length  with the patient today.    Labs/ tests ordered today include: 30-Day cardiac monitor.   Phill Myron. West Pugh, ANP, AACC   10/21/2017 2:42 PM    [facility] [address] [place], [place] [address] Phone: [phone]; Fax: [phone]

## 2017-10-21 ENCOUNTER — Encounter: Payer: Self-pay | Admitting: Adult Health

## 2017-10-21 ENCOUNTER — Ambulatory Visit: Payer: Medicare Other | Admitting: Adult Health

## 2017-10-21 VITALS — BP 138/82 | HR 51 | Ht 66.0 in | Wt 141.0 lb

## 2017-10-21 DIAGNOSIS — I495 Sick sinus syndrome: Secondary | ICD-10-CM | POA: Diagnosis not present

## 2017-10-21 DIAGNOSIS — I10 Essential (primary) hypertension: Secondary | ICD-10-CM | POA: Diagnosis not present

## 2017-10-21 DIAGNOSIS — E7800 Pure hypercholesterolemia, unspecified: Secondary | ICD-10-CM

## 2017-10-21 DIAGNOSIS — I471 Supraventricular tachycardia: Secondary | ICD-10-CM

## 2017-10-21 MED ORDER — VERAPAMIL HCL ER 180 MG PO TBCR: 90.0000 mg | EXTENDED_RELEASE_TABLET | Freq: Every day | ORAL | 3 refills | Status: DC

## 2017-10-21 NOTE — Patient Instructions (Signed)
Medication Instructions:  DECREASE VERAPAMIL 90MG  DAILY  CONTINUE METOPROLOL AS NEEDED ONLY  If you need a refill on your cardiac medications before your next appointment, please call your pharmacy.  Testing/Procedures: Your physician has recommended that you wear an event monitor. This will be performed at our [facility] location - [address].  Event monitors are medical devices that record the heart's electrical activity. Doctors most often [place] these monitors to diagnose arrhythmias. Arrhythmias are problems with the speed or rhythm of the heartbeat. The monitor is a small, portable device. You can wear one while you do your normal daily activities. This is usually used to diagnose what is causing palpitations/syncope (passing out).  Follow-Up: Your physician wants you to follow-up in: AFTER MONITOR (ABOUT 6 WEEKS)   Thank you for choosing [facility] at [facility]!!

## 2017-11-02 ENCOUNTER — Other Ambulatory Visit: Payer: Self-pay | Admitting: Cardiovascular Disease

## 2017-11-03 ENCOUNTER — Other Ambulatory Visit: Payer: Self-pay | Admitting: Adult Health

## 2017-11-03 ENCOUNTER — Ambulatory Visit (INDEPENDENT_AMBULATORY_CARE_PROVIDER_SITE_OTHER): Payer: Medicare Other

## 2017-11-03 DIAGNOSIS — I495 Sick sinus syndrome: Secondary | ICD-10-CM | POA: Diagnosis not present

## 2017-11-03 DIAGNOSIS — I471 Supraventricular tachycardia: Secondary | ICD-10-CM | POA: Diagnosis not present

## 2017-11-03 DIAGNOSIS — R42 Dizziness and giddiness: Secondary | ICD-10-CM

## 2017-11-03 DIAGNOSIS — I441 Atrioventricular block, second degree: Secondary | ICD-10-CM

## 2017-11-16 ENCOUNTER — Other Ambulatory Visit: Payer: Self-pay | Admitting: *Deleted

## 2017-11-16 DIAGNOSIS — I712 Thoracic aortic aneurysm, without rupture, unspecified: Secondary | ICD-10-CM

## 2017-12-08 ENCOUNTER — Encounter: Payer: Self-pay | Admitting: Adult Health

## 2017-12-09 ENCOUNTER — Ambulatory Visit: Payer: Medicare Other | Admitting: Adult Health

## 2017-12-09 ENCOUNTER — Encounter: Payer: Self-pay | Admitting: Adult Health

## 2017-12-09 VITALS — BP 124/80 | HR 56 | Ht 66.0 in | Wt 140.2 lb

## 2017-12-09 DIAGNOSIS — I714 Abdominal aortic aneurysm, without rupture, unspecified: Secondary | ICD-10-CM

## 2017-12-09 DIAGNOSIS — E7800 Pure hypercholesterolemia, unspecified: Secondary | ICD-10-CM | POA: Diagnosis not present

## 2017-12-09 DIAGNOSIS — I10 Essential (primary) hypertension: Secondary | ICD-10-CM | POA: Diagnosis not present

## 2017-12-09 DIAGNOSIS — I471 Supraventricular tachycardia: Secondary | ICD-10-CM

## 2017-12-09 MED ORDER — VERAPAMIL HCL ER 120 MG PO TBCR: 120.0000 mg | EXTENDED_RELEASE_TABLET | Freq: Every day | ORAL | 3 refills | Status: DC

## 2017-12-09 NOTE — Progress Notes (Signed)
Cardiology Office Note   Date:  12/09/2017   ID:  Haley [name], Haley [name] 03-12-1934, MRN [id number]  PCP:  Aretta Nip, MD  Cardiologist: Dr. Claiborne Billings  Chief Complaint  Patient presents with  . Follow-up    pt denies chest pains, SOB, swelling in hands/feet     History of Present Illness: Haley [name] is a 84 y.o. female who presents for ongoing assessment and management of SVT on AV nodal blocking agents with BB as well as verapamil, AAA (followed by [name]), hypercholesterolemia, and HTN. On last visit, the patient was complaining of worsening tachycardia and hypotension with BP 87/61 at home.   States her HR went up while lying on the couch with associated dizziness,. A cardiac monitor was placed to evaluate her HR and rhythm. I decreased verapamil to 90 mg from 120 mg daily, she was to take prn metoprolol for racing HR. she is without any complaints of recurrent heart racing or hypotension.  The patient continues to be active going to the gym, walking, and participating in yoga.  She denies any dizziness.  Unfortunately, the patient states she feels worse instead of better with decreased verapamil dose.  She states that she cannot put her finger on it but she does not feel as energetic as she used to.  I have reviewed her endocervix report from cardiac monitoring although this is not been officially read by Dr. Claiborne Billings at the time of this office visit.  The patient's average heart rate is 64 bpm, lowest heart rate was 40 bpm highest heart rate was 124 bpm (the patient states this was likely due to when she was walking in the gym during her workout).  The patient did not have any pauses, she did have occasional PVCs, and PACs.  The patient self- triggered the monitor 13 times.  There were no serious events.  Past Medical History:  Diagnosis Date  . Arthritis    knees and left hip  . Atrophic gastritis without mention of hemorrhage   . Breast cancer ([place])   . Bronchitis, not  specified as acute or chronic   . Cancer [facility])    Lung cancer  . Cardiac dysrhythmia, unspecified   . Cataract   . Chronic pancreatitis ([place])   . Complication of anesthesia   . Cough   . Esophageal reflux   . Family history of malignant neoplasm of gastrointestinal tract   . Fatty liver   . Flatulence, eructation, and gas pain   . Gastric erosions    [name]  . Hiatal hernia   . Hoarseness    since virus 2 months agp  . Irritable bowel syndrome   . Irritable bowel syndrome   . LGSIL (low grade squamous intraepithelial dysplasia) 11/15/2014   with biopsy of  benign vaginal cyst  . Malignant neoplasm of breast (female), unspecified site    Skin Cancer legs, face, shoulder, some basal some squamous  . Other chest pain   . Personal history of radiation therapy   . Pneumonia, organism unspecified(486)   . PONV (postoperative nausea and vomiting)    with hemorrhoid surgery years ago, no n/v with breast surgery  . Pure hypercholesterolemia   . Shortness of breath    with Exertion  . Sorethroat    since virus 2 months ago  . Symptomatic menopausal or female climacteric states   . Thoracic aortic aneurysm without rupture ([place]) 08/29/12   08/29/12 discovered on PET/CT  . Tubular adenoma of colon   .  Unspecified essential hypertension   . Urinary tract infection, site not specified     Past Surgical History:  Procedure Laterality Date  . BREAST LUMPECTOMY    . BREAST SURGERY    . CATARACT EXTRACTION     Bilateral  . COLONOSCOPY    . EYE SURGERY    . HEMORROIDECTOMY    . SKIN CANCER EXCISION    . VIDEO ASSISTED THORACOSCOPY (VATS)/WEDGE RESECTION Left 09/19/2012   Procedure: LEFT VIDEO ASSISTED THORACOSCOPY (VATS)/LEFT UPPER LOBE WEDGE RESECTION & NODE DISSECTION;  Surgeon: Melrose Nakayama, MD;  Location: [place];  Service: Thoracic;  Laterality: Left;     Current Outpatient Medications  Medication Sig Dispense Refill  . atorvastatin (LIPITOR) 20 MG tablet Take 1 tablet by  mouth  daily 90 tablet 0  . b complex vitamins tablet Take 1 tablet by mouth daily.     . Cholecalciferol (VITAMIN D3) 10000 units TABS Take 1 tablet by mouth daily.    Marland Kitchen co-enzyme Q-10 30 MG capsule Take 300 mg by mouth daily.    . metoprolol succinate (TOPROL-XL) 25 MG 24 hr tablet Take 0.5 tablets (12.5 mg total) by mouth daily as needed. 15 tablet 3  . Probiotic Product (PROBIOTIC DAILY PO) Take by mouth.    . verapamil (CALAN-SR) 120 MG CR tablet Take 1 tablet (120 mg total) by mouth daily. 90 tablet 3   No current facility-administered medications for this visit.     Allergies:   Patient has no known allergies.    Social History:  The patient  reports that she has never smoked. She has never used smokeless tobacco. She reports that she does not drink alcohol or use drugs.   Family History:  The patient's family history includes Cancer in her brother and father; Colon cancer (age of onset: 38) in her father; Diabetes in her paternal aunt; Heart disease in her brother and mother; Multiple myeloma (age of onset: 57) in her brother; Stroke in her father.    ROS: All other systems are reviewed and negative. Unless otherwise mentioned in H&P    PHYSICAL EXAM: VS:  BP 124/80 (BP Location: Left Arm)   Pulse (!) 56   Ht '5\' 6"'  (1.676 m)   Wt 140 lb 3.2 oz (63.6 kg)   BMI 22.63 kg/m  , BMI Body mass index is 22.63 kg/m.   GEN: Well nourished, well developed, in no acute distress  HEENT: normal  Neck: no JVD, carotid bruits, or masses Cardiac: RRR; no murmurs, rubs, or gallops,no edema  Respiratory:  Clear to auscultation bilaterally, normal work of breathing GI: soft, nontender, nondistended, + BS MS: no deformity or atrophy  Skin: warm and dry, no rash Neuro:  Strength and sensation are intact Psych: euthymic mood, full affect   EKG:   Not completed during office visit.  Recent Labs: No results found for requested labs within last 8760 hours.    Lipid Panel    Component  Value Date/Time   CHOL 150 09/05/2015 0854   TRIG 97 09/05/2015 0854   HDL 77 09/05/2015 0854   CHOLHDL 1.9 09/05/2015 0854   VLDL 19 09/05/2015 0854   LDLCALC 54 09/05/2015 0854      Wt Readings from Last 3 Encounters:  12/09/17 140 lb 3.2 oz (63.6 kg)  10/21/17 141 lb (64 kg)  06/15/17 145 lb (65.8 kg)   She ASSESSMENT AND PLAN:  1.  PSVT: The patient has had no further events with decreased dose of  verapamil.  She states she was very hypotensive and felt lightheaded and dizzy prior to medication adjustment.  Cardiac monitor did not reveal any evidence of recurrent PSVT, significant bradycardia, pauses, or lethal arrhythmias.  She did have frequent PACs and occasional PVC.  I am not going to make any further changes at this time and continue her on the lower dose of verapamil.  She does still have the option of taking metoprolol 12.5 mg as needed for rapid heart rhythm.    Unfortunately, the patient states she does not feel as energetic or well as she did prior to adjusting her medications.  She will continue to follow-up with PCP.  Blood pressure is much better on office visit today at 124/80.  She has not noticed any hypotensive blood pressures at home when she takes it on her own monitor.  Recent labs have been reviewed and were completed in February.  She continues to be active going to the gym walking and doing yoga.  I have advised her to give this more time on the lower dose.  She will follow-up with Dr. Claiborne Billings in 3 months.  2.  Hypercholesterolemia: Continue atorvastatin 20 mg daily as directed.  Labs per PCP. Current medicines are reviewed at length with the patient today.    3.  History of AAA: Followed by [name]   Labs/ tests ordered today include:  Phill Myron. West Pugh, ANP, AACC   12/09/2017 10:41 AM    [facility]  [address], [place], [place] [address] Phone: [phone]; Fax: [phone]

## 2017-12-09 NOTE — Patient Instructions (Signed)
Follow-Up: Your physician wants you to follow-up in: [place] ([place]), DNP,AACC IF PRIMARY CARDIOLOGIST IS UNAVAILABLE.

## 2017-12-21 ENCOUNTER — Ambulatory Visit
Admission: RE | Admit: 2017-12-21 | Discharge: 2017-12-21 | Disposition: A | Discharge status: Home / Self Care | Payer: Medicare Other | Source: Ambulatory Visit | Attending: Thoracic Surgery (Cardiothoracic Vascular Surgery) | Admitting: Thoracic Surgery (Cardiothoracic Vascular Surgery)

## 2017-12-21 ENCOUNTER — Encounter: Payer: Self-pay | Admitting: Thoracic Surgery (Cardiothoracic Vascular Surgery)

## 2017-12-21 ENCOUNTER — Ambulatory Visit: Payer: Medicare Other | Admitting: Thoracic Surgery (Cardiothoracic Vascular Surgery)

## 2017-12-21 VITALS — BP 120/65 | HR 58 | Resp 20 | Ht 66.0 in | Wt 141.0 lb

## 2017-12-21 DIAGNOSIS — I712 Thoracic aortic aneurysm, without rupture, unspecified: Secondary | ICD-10-CM

## 2017-12-21 MED ORDER — IOPAMIDOL (ISOVUE-370) INJECTION 76%
75.0000 mL | Freq: Once | INTRAVENOUS | Status: AC | PRN
  Administered 2017-12-21: 75 mL via INTRAVENOUS

## 2017-12-21 NOTE — Progress Notes (Signed)
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  Her aneurysm was first noted on her PET/CT to evaluate her lung nodule.  It measured 4.5cm in 2014.  On her most recent follow-up in November 2018 it was 4.7 cm.  In the interval since her last visit she had a cardiac monitor placed for bradycardia.  She was noted to have occasional SVT and bradycardia into the 40s at times.  Her verapamil dose was decreased to 120 mg daily.  She says she felt very fatigued when that dose was first adjusted but otherwise has been doing well.    Past Medical History:  Diagnosis Date  . Arthritis    knees and left hip  . Atrophic gastritis without mention of hemorrhage   . Breast cancer ([place])   . Bronchitis, not specified as acute or chronic   . Cancer [facility])    Lung cancer  . Cardiac dysrhythmia, unspecified   . Cataract   . Chronic pancreatitis ([place])   . Complication of anesthesia   . Cough   . Esophageal reflux   . Family history of malignant neoplasm of gastrointestinal tract   . Fatty liver   . Flatulence, eructation, and gas pain   . Gastric erosions    Haley [name]  . Hiatal hernia   . Hoarseness    since virus 2 months agp  . Irritable bowel syndrome   . Irritable bowel syndrome   . LGSIL (low grade squamous intraepithelial dysplasia) 11/15/2014   with biopsy of  benign vaginal cyst  . Malignant neoplasm of breast (female), unspecified site    Skin Cancer legs, face, shoulder, some basal some squamous  . Other chest pain   . Personal history of radiation  therapy   . Pneumonia, organism unspecified(486)   . PONV (postoperative nausea and vomiting)    with hemorrhoid surgery years ago, no n/v with breast surgery  . Pure hypercholesterolemia   . Shortness of breath    with Exertion  . Sorethroat    since virus 2 months ago  . Symptomatic menopausal or female climacteric states   . Thoracic aortic aneurysm without rupture ([place]) 08/29/12   08/29/12 discovered on PET/CT  . Tubular adenoma of colon   . Unspecified essential hypertension   . Urinary tract infection, site not specified     Current Outpatient Medications  Medication Sig Dispense Refill  . atorvastatin (LIPITOR) 20 MG tablet Take 1 tablet by mouth  daily 90 tablet 0  . b complex vitamins tablet Take 1 tablet by mouth daily.     . Cholecalciferol (VITAMIN D3) 10000 units TABS Take 1 tablet by mouth daily.    Marland Kitchen co-enzyme Q-10 30 MG capsule Take 300 mg by mouth daily.    . metoprolol succinate (TOPROL-XL) 25 MG 24 hr tablet Take 0.5 tablets (12.5 mg total) by mouth daily as needed. 15 tablet 3  . Probiotic Product (PROBIOTIC DAILY PO) Take by mouth.    . verapamil (CALAN-SR) 120 MG CR tablet Take  1 tablet (120 mg total) by mouth daily. 90 tablet 3   No current facility-administered medications for this visit.     Physical Exam BP 120/65   Pulse (!) 58   Resp 20   Ht 5\' 6"  (1.676 m)   Wt 141 lb (64 kg)   SpO2 98% Comment: RA  BMI 22.21 kg/m  84 year old woman in no acute distress Well-developed and well-nourished Lungs clear with equal breath sounds bilaterally Cardiac regular rate and rhythm normal S1 and S2  Diagnostic Tests: CT ANGIOGRAPHY CHEST WITH CONTRAST  TECHNIQUE: Multidetector CT imaging of the chest was performed using the standard protocol during bolus administration of intravenous contrast. Multiplanar CT image reconstructions and MIPs were obtained to evaluate the vascular anatomy.  CONTRAST:  43mL ISOVUE-370 IOPAMIDOL (ISOVUE-370) INJECTION  76%  Creatinine was obtained on site at [place] at [address]  Results: Creatinine 0.7 mg/dL.  Estimated GFR 81 mL/minute  COMPARISON:  06/15/2017 and 12/22/2016  FINDINGS: Cardiovascular: The thoracic aorta shows stable aneurysmal disease. The aortic root is of normal caliber measuring 3.8 cm at the level of the sinuses of Valsalva. The ascending thoracic aorta measures 4.7 cm in greatest diameter. The proximal arch measures 3.7 cm. The distal arch measures 2.8 cm. The descending thoracic aorta measures 2.6 cm. No evidence of aortic dissection. Proximal great vessels show normal patency.  The heart size is stable and mildly enlarged. No pericardial fluid identified. No significant calcified coronary artery plaque identified. Central pulmonary arteries are normal in caliber.  Mediastinum/Nodes: No enlarged mediastinal, hilar, or axillary lymph nodes. Thyroid gland, trachea, and esophagus demonstrate no significant findings.  Lungs/Pleura: Stable scattered parenchymal scarring in both lungs and evidence of prior left lung wedge resection surgery. There is no evidence of pulmonary edema, consolidation, pneumothorax, nodule or pleural fluid.  Upper Abdomen: Stable 7 mm calcification in the splenic hilum likely representing a small densely calcified splenic artery aneurysm. This may be thrombosed based on appearance.  Musculoskeletal: No chest wall abnormality. No acute or significant osseous findings.  Review of the MIP images confirms the above findings.  IMPRESSION: 1. Stable aneurysmal disease of the ascending thoracic aorta which measures 4.7 cm in greatest diameter. No evidence of aortic dissection. 2. Stable postoperative changes after prior left lung wedge resection surgery. 3. Stable 7 mm dense calcification in the splenic hilum likely representing a small densely calcified splenic artery aneurysm.  Aortic aneurysm NOS  (ICD10-I71.9).   Electronically Signed   By: Aletta Edouard M.D.   On: 12/21/2017 13:49 I personally reviewed the CT images and concur with the findings noted above  Impression: Haley [name] is an 84 year old woman with a history of hypertension, and ascending aneurysm, and a wedge resection for a stage IA adenocarcinoma in 2014.  Stage IA adenocarcinoma-wedge resection.  She is now 5 years out with no evidence of recurrence.  She is a lifelong non-smoker and does not need follow-up regarding that issue.  Ascending aneurysm-stable at 4.7 cm.  Needs continued 72-month follow-up.  Blood pressure control is paramount.  Hypertension-blood pressure well controlled on her current regimen.  Plan: Return in 6 months with CT angio of chest  Melrose Nakayama, MD [facility] [phone]

## 2018-03-04 ENCOUNTER — Other Ambulatory Visit: Payer: Self-pay | Admitting: Family Medicine

## 2018-03-04 DIAGNOSIS — Z1231 Encounter for screening mammogram for malignant neoplasm of breast: Secondary | ICD-10-CM

## 2018-03-10 ENCOUNTER — Other Ambulatory Visit: Payer: Self-pay | Admitting: Family Medicine

## 2018-03-10 DIAGNOSIS — E2839 Other primary ovarian failure: Secondary | ICD-10-CM

## 2018-03-17 ENCOUNTER — Ambulatory Visit: Payer: Medicare Other

## 2018-03-23 ENCOUNTER — Encounter: Payer: Self-pay | Admitting: Cardiovascular Disease

## 2018-03-23 ENCOUNTER — Ambulatory Visit: Payer: Medicare Other | Admitting: Cardiovascular Disease

## 2018-03-23 VITALS — BP 150/82 | HR 53 | Ht 66.0 in | Wt 138.8 lb

## 2018-03-23 DIAGNOSIS — I10 Essential (primary) hypertension: Secondary | ICD-10-CM

## 2018-03-23 DIAGNOSIS — I712 Thoracic aortic aneurysm, without rupture, unspecified: Secondary | ICD-10-CM

## 2018-03-23 DIAGNOSIS — E7800 Pure hypercholesterolemia, unspecified: Secondary | ICD-10-CM

## 2018-03-23 DIAGNOSIS — I471 Supraventricular tachycardia: Secondary | ICD-10-CM

## 2018-03-23 DIAGNOSIS — Z79899 Other long term (current) drug therapy: Secondary | ICD-10-CM

## 2018-03-23 DIAGNOSIS — Z85118 Personal history of other malignant neoplasm of bronchus and lung: Secondary | ICD-10-CM

## 2018-03-23 MED ORDER — LOSARTAN POTASSIUM 50 MG PO TABS: 50.0000 mg | ORAL_TABLET | Freq: Every day | ORAL | 3 refills | Status: DC

## 2018-03-23 MED ORDER — ATORVASTATIN CALCIUM 40 MG PO TABS: 40.0000 mg | ORAL_TABLET | Freq: Every day | ORAL | 3 refills | Status: DC

## 2018-03-23 NOTE — Patient Instructions (Signed)
Medication Instructions:  INCREASE atorvastatin (Lipitor) to 40 mg daily  START Losartan 50 mg daily ----take 25 mg (1/2 tablet) daily for the first week  Labwork: Please return for FASTING labs in 2 months (CMET, CBC, Lipid)  Our in office lab hours are Monday-Friday 8:00-4:00, closed for lunch 12:45-1:45 pm.  No appointment needed.  Follow-Up: 3 months with Dr. Claiborne Billings  Any Other Special Instructions Will Be Listed Below (If Applicable).     If you need a refill on your cardiac medications before your next appointment, please call your pharmacy.

## 2018-03-23 NOTE — Progress Notes (Signed)
Patient ID: [name], female   DOB: 02-Mar-1934, 84 y.o.   MRN: [id number]     HPI: Haley [name] is a 84 y.o. female who presents to the office today for 15 month cardiology evaluation.  Haley [name]  has a history of superventricular tachycardia in the past has been treated with verapamil as well as beta blocker therapy. Recently, she has done well and now only takes a beta blocker on an as needed basis and admits to only rarely taking this. She has a remote history of breast cancer. She has a history of hiatal hernia with GERD, hyperlipidemia, and also evidence for descending thoracic aorta that is increased at 4.5 cm on a CT scan suggested fusiform aneurysmal dilatation. On the initial CT in 2011 she was found to have a right upper lung nodule. 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 [name]. She has had subsequent CT scan is for followup.  Her last CT angiogram of her chest was on 10/23/2014 which demonstrated a stable descending thoracic aortic aneurysm measuring 4.8 cm maximally.  Semiannual imaging follow-up I, CTA or MRI was recommended. She denies recent chest pain. She denies tachycardia palpitations. She denies PND or orthopnea. She denies bleeding.  Laboratory by her primary physician in February 2016: Her hemoglobin and hematocrit were stable at 12.8 and 39.4.  Chemistry profile was normal.  Chest normal renal function.  Lipids are excellent with a total cholesterol 165, triglycerides 131, HDL 67, LDL 72.   She is followed by [name] for her ascending thoracic aortic aneurysm.  I last saw her in May 2018.  The past 15 months, she has remained stable and has been without recurrent episodes of SVT.  She recently saw [name] on Dec 21, 2017 and her most recent CT showed a stable a sending thoracic aneurysm at 4.7 cm in greatest diameter without evidence for  dissection.  There were stable postoperative changes after prior left lung wedge resection.  There was a stable 7 mm dense calcification in the splenic hilum likely representing a small densely calcified splenic artery aneurysm.  She continues to be on six-month follow-up CT imaging per [name].    She denies any episodes of chest pain.  She has been on atorvastatin 20 mg for hyperlipidemia.  In February 2019 LDL cholesterol was 86.  She is on metoprolol 12.5 mg and verapamil 120 mg for hypertension as well as her SVT.  She presents for reevaluation.    Past Medical History:  Diagnosis Date  . Arthritis    knees and left hip  . Atrophic gastritis without mention of hemorrhage   . Breast cancer ([place])   . Bronchitis, not specified as acute or chronic   . Cancer Surgery [facility])    Lung cancer  . Cardiac dysrhythmia, unspecified   . Cataract   . Chronic pancreatitis ([place])   . Complication of anesthesia   . Cough   . Esophageal reflux   . Family history of malignant neoplasm of gastrointestinal tract   . Fatty liver   . Flatulence, eructation, and gas pain   . Gastric erosions    Haley [name]  . Hiatal hernia   . Hoarseness    since virus 2 months agp  . Irritable bowel syndrome   . Irritable bowel syndrome   . LGSIL (low grade squamous intraepithelial dysplasia) 11/15/2014   with  biopsy of  benign vaginal cyst  . Malignant neoplasm of breast (female), unspecified site    Skin Cancer legs, face, shoulder, some basal some squamous  . Other chest pain   . Personal history of radiation therapy   . Pneumonia, organism unspecified(486)   . PONV (postoperative nausea and vomiting)    with hemorrhoid surgery years ago, no n/v with breast surgery  . Pure hypercholesterolemia   . Shortness of breath    with Exertion  . Sorethroat    since virus 2 months ago  . Symptomatic menopausal or female climacteric states   . Thoracic aortic aneurysm without rupture ([place]) 08/29/12   08/29/12 discovered on  PET/CT  . Tubular adenoma of colon   . Unspecified essential hypertension   . Urinary tract infection, site not specified     Past Surgical History:  Procedure Laterality Date  . BREAST LUMPECTOMY    . BREAST SURGERY    . CATARACT EXTRACTION     Bilateral  . COLONOSCOPY    . EYE SURGERY    . HEMORROIDECTOMY    . SKIN CANCER EXCISION    . VIDEO ASSISTED THORACOSCOPY (VATS)/WEDGE RESECTION Left 09/19/2012   Procedure: LEFT VIDEO ASSISTED THORACOSCOPY (VATS)/LEFT UPPER LOBE WEDGE RESECTION & NODE DISSECTION;  Surgeon: Melrose Nakayama, MD;  Location: [facility];  Service: Thoracic;  Laterality: Left;    No Known Allergies  Current Outpatient Medications  Medication Sig Dispense Refill  . atorvastatin (LIPITOR) 40 MG tablet Take 1 tablet (40 mg total) by mouth daily. 90 tablet 3  . b complex vitamins tablet Take 1 tablet by mouth daily.     . Cholecalciferol (VITAMIN D3) 10000 units TABS Take 1 tablet by mouth daily.    Marland Kitchen co-enzyme Q-10 30 MG capsule Take 300 mg by mouth daily.    . metoprolol succinate (TOPROL-XL) 25 MG 24 hr tablet Take 0.5 tablets (12.5 mg total) by mouth daily as needed. 15 tablet 3  . Probiotic Product (PROBIOTIC DAILY PO) Take by mouth.    . verapamil (CALAN-SR) 120 MG CR tablet Take 1 tablet (120 mg total) by mouth daily. 90 tablet 3  . losartan (COZAAR) 50 MG tablet Take 1 tablet (50 mg total) by mouth daily. 90 tablet 3   No current facility-administered medications for this visit.     Social History   Socioeconomic History  . Marital status: Married    Spouse name: Not on file  . Number of children: 2  . Years of education: Not on file  . Highest education level: Not on file  Occupational History  . Occupation: Retired    Comment: Ecologist  . Financial resource strain: Not on file  . Food insecurity:    Worry: Not on file    Inability: Not on file  . Transportation needs:    Medical: Not on file    Non-medical: Not on file    Tobacco Use  . Smoking status: Never Smoker  . Smokeless tobacco: Never Used  Substance and Sexual Activity  . Alcohol use: No    Alcohol/week: 0.0 standard drinks  . Drug use: No  . Sexual activity: Never    Comment: 1st intercourse 89 yo-1 partner  Lifestyle  . Physical activity:    Days per week: Not on file    Minutes per session: Not on file  . Stress: Not on file  Relationships  . Social connections:    Talks on phone: Not on file  Gets together: Not on file    Attends religious service: Not on file    Active member of club or organization: Not on file    Attends meetings of clubs or organizations: Not on file    Relationship status: Not on file  . Intimate partner violence:    Fear of current or ex partner: Not on file    Emotionally abused: Not on file    Physically abused: Not on file    Forced sexual activity: Not on file  Other Topics Concern  . Not on file  Social History Narrative   Married   Retired   Careers adviser          Family History  Problem Relation Age of Onset  . Colon cancer Father 32  . Cancer Father   . Stroke Father   . Heart disease Mother   . Heart disease Brother   . Cancer Brother        mass behind lung-died age 55  . Multiple myeloma Brother 56       died age 72  . Diabetes Paternal Aunt     ROS General: Negative; No fevers, chills, or night sweats;  HEENT: Negative; No changes in vision or hearing, sinus congestion, difficulty swallowing Pulmonary: Negative; No cough, wheezing, shortness of breath, hemoptysis Cardiovascular: Negative; No chest pain, presyncope, syncope, palpitations; no recurrent SVT GI: Negative; No nausea, vomiting, diarrhea, or abdominal pain GU: Negative; No dysuria, hematuria, or difficulty voiding Musculoskeletal: Negative; no myalgias, joint pain, or weakness Hematologic/Oncology: Negative; no easy bruising, bleeding Endocrine: Negative; no heat/cold intolerance; no diabetes Neuro:  Negative; no changes in balance, headaches Skin: Negative; No rashes or skin lesions Psychiatric: Negative; No behavioral problems, depression Sleep: Negative; No snoring, daytime sleepiness, hypersomnolence, bruxism, restless legs, hypnogognic hallucinations, no cataplexy Other comprehensive 14 point system review is negative.   PE BP (!) 150/82   Pulse (!) 53   Ht '5\' 6"'  (1.676 m)   Wt 138 lb 12.8 oz (63 kg)   BMI 22.40 kg/m    Repeat blood pressure by me was elevated at 146/84.  Wt Readings from Last 3 Encounters:  03/23/18 138 lb 12.8 oz (63 kg)  12/21/17 141 lb (64 kg)  12/09/17 140 lb 3.2 oz (63.6 kg)   General: Alert, oriented, no distress.  Skin: normal turgor, no rashes, warm and dry HEENT: Normocephalic, atraumatic. Pupils equal round and reactive to light; sclera anicteric; extraocular muscles intact; Nose without nasal septal hypertrophy Mouth/Parynx benign; Mallinpatti scale 2 Neck: No JVD, no carotid bruits; normal carotid upstroke Lungs: clear to ausculatation and percussion; no wheezing or rales Chest wall: without tenderness to palpitation Heart: PMI not displaced, RRR, s1 s2 normal, 1/6 systolic murmur, no diastolic murmur, no rubs, gallops, thrills, or heaves Abdomen: soft, nontender; no hepatosplenomehaly, BS+; abdominal aorta nontender and not dilated by palpation. Back: no CVA tenderness Pulses 2+ Musculoskeletal: full range of motion, normal strength, no joint deformities Extremities: no clubbing cyanosis or edema, Homan's sign negative  Neurologic: grossly nonfocal; Cranial nerves grossly wnl Psychologic: Normal mood and affect   ECG (independently read by me): Sinus bradycardia 53 bpm.  First-degree AV block with a PR interval of 216 ms.  Poor R wave progression V1 through V3.  May 2018 ECG (independently read by me): Sinus bradycardia 56 bpm.  Mild sinus arrhythmia.  Poor R-wave progression V1 V2 2.  Normal intervals.  April 2016 ECG (independently  read by me): Sinus bradycardia  at 51 bpm.  Poor progression V1 through V3.  QTc interval 420 ms.  September 2014 ECG: Sinus rhythm 58 beats per minute. Intervals normal.  LABS: I personally reviewed  laboratory done on 09/10/2016 by Harlene Ramus, M.D., done at [facility]  at [place] & Units 09/05/2015 05/20/2015 11/30/2014  Glucose 65 - 99 mg/dL 86 - 90  BUN 7 - 25 mg/dL 14 - 16  Creatinine 0.60 - 0.93 mg/dL 0.80 0.9 0.77  Sodium 135 - 146 mmol/L 140 - 138  Potassium 3.5 - 5.3 mmol/L 4.0 - 4.4  Chloride 98 - 110 mmol/L 106 - 104  CO2 20 - 31 mmol/L 22 - 30  Calcium 8.6 - 10.4 mg/dL 8.9 - 9.0    Hepatic Function Latest Ref Rng & Units 09/05/2015 09/11/2014 08/29/2013  Total Protein 6.1 - 8.1 g/dL 6.7 6.6 7.4  Albumin 3.6 - 5.1 g/dL 3.9 3.9 4.3  AST 10 - 35 U/L '17 14 19  ' ALT 6 - 29 U/L '10 9 10  ' Alk Phosphatase 33 - 130 U/L 59 56 66  Total Bilirubin 0.2 - 1.2 mg/dL 1.1 1.1 1.1    CBC Latest Ref Rng & Units 09/05/2015 09/11/2014 08/29/2013  WBC 4.0 - 10.5 K/uL 3.8(L) 4.7 5.8  Hemoglobin 12.0 - 15.0 g/dL 12.2 12.8 13.0  Hematocrit 36.0 - 46.0 % 36.3 39.4 38.9  Platelets 150 - 400 K/uL 240 294 332   Lab Results  Component Value Date   TSH 4.002 07/05/2012    BNP No results found for: PROBNP  Lipid Panel     Component Value Date/Time   CHOL 150 09/05/2015 0854   TRIG 97 09/05/2015 0854   HDL 77 09/05/2015 0854   CHOLHDL 1.9 09/05/2015 0854   VLDL 19 09/05/2015 0854   LDLCALC 54 09/05/2015 0854     RADIOLOGY: No results found.  IMPRESSION:  1. Essential hypertension   2. Thoracic aortic aneurysm without rupture ([place])   3. Hypercholesterolemia   4. Paroxysmal SVT (supraventricular tachycardia) ([place])   5. Medication management   6. History of lung cancer: Stage Ia adenocarcinoma with lipidic spread in the left upper lobe treated with wedge resection 2014     ASSESSMENT AND PLAN: Ms. Revelle is an 84 year old female who continues to do well from a  cardiovascular standpoint. She has a history of paroxysmal supraventricular tachycardia and this has continued to be well controlled with combination verapamil 120 mg in addition to Toprol-XL 12.5 mg.  Remotely, her verapamil dose was reduced due to bradycardia.  Her heart rate today is 53.  She is asymptomatic with reference to presyncope or syncope.  Her blood pressure today is elevated particularly based on new hypertensive guidelines with target LDL less than 130 and less than 80.  She has a 4.7 cm ascending aortic aneurysm.  Optimal blood pressure control is essential.  I am recommending initiation of losartan 25 mg daily for 1 week and then she will titrate this to 50 mg daily.  There are no signs of volume overload and there is no edema and I do not feel she needs a diuretic.  I also have recommended further titration of atorvastatin to 40 mg in light of her LDL of 86 target LDL less than 70.  Remotely she was diagnosed with stage Ia adeno carcinoma and underwent left upper lobe wedge resection in 2014 which has been stable.  In 2 months repeat laboratory will be obtained in the fasting state.  I will see her in 3 months for cardiology reevaluation.   Time spent: 25 minutes Troy Sine, MD, [facility]  03/23/2018 3:17 PM

## 2018-04-29 ENCOUNTER — Ambulatory Visit
Admission: RE | Admit: 2018-04-29 | Discharge: 2018-04-29 | Disposition: A | Discharge status: Home / Self Care | Payer: Medicare Other | Source: Ambulatory Visit | Attending: Family Medicine | Admitting: Family Medicine

## 2018-04-29 DIAGNOSIS — Z1231 Encounter for screening mammogram for malignant neoplasm of breast: Secondary | ICD-10-CM

## 2018-04-29 DIAGNOSIS — E2839 Other primary ovarian failure: Secondary | ICD-10-CM

## 2018-06-02 LAB — COMPREHENSIVE METABOLIC PANEL
A/G RATIO: 2 (ref 1.2–2.2)
ALT: 15 IU/L (ref 0–32)
AST: 27 IU/L (ref 0–40)
Albumin: 4.5 g/dL (ref 3.5–4.7)
Alkaline Phosphatase: 72 IU/L (ref 39–117)
BUN/Creatinine Ratio: 17 (ref 12–28)
BUN: 14 mg/dL (ref 8–27)
Bilirubin Total: 1.2 mg/dL (ref 0.0–1.2)
CALCIUM: 9.3 mg/dL (ref 8.7–10.3)
CO2: 25 mmol/L (ref 20–29)
CREATININE: 0.81 mg/dL (ref 0.57–1.00)
Chloride: 102 mmol/L (ref 96–106)
GFR, EST AFRICAN AMERICAN: 79 mL/min/{1.73_m2} (ref >59)
GFR, EST NON AFRICAN AMERICAN: 68 mL/min/{1.73_m2} (ref >59)
GLUCOSE: 84 mg/dL (ref 65–99)
Globulin, Total: 2.3 g/dL (ref 1.5–4.5)
Potassium: 4.9 mmol/L (ref 3.5–5.2)
Sodium: 141 mmol/L (ref 134–144)
TOTAL PROTEIN: 6.8 g/dL (ref 6.0–8.5)

## 2018-06-02 LAB — LIPID PANEL
CHOLESTEROL TOTAL: 155 mg/dL (ref 100–199)
Chol/HDL Ratio: 1.9 ratio (ref 0.0–4.4)
HDL: 83 mg/dL (ref >39)
LDL Calculated: 55 mg/dL (ref 0–99)
Triglycerides: 84 mg/dL (ref 0–149)
VLDL Cholesterol Cal: 17 mg/dL (ref 5–40)

## 2018-06-02 LAB — CBC
HEMATOCRIT: 37.1 % (ref 34.0–46.6)
HEMOGLOBIN: 12.4 g/dL (ref 11.1–15.9)
MCH: 30.8 pg (ref 26.6–33.0)
MCHC: 33.4 g/dL (ref 31.5–35.7)
MCV: 92 fL (ref 79–97)
PLATELETS: 284 10*3/uL (ref 150–450)
RBC: 4.03 x10E6/uL (ref 3.77–5.28)
RDW: 12.9 % (ref 12.3–15.4)
WBC: 4.6 10*3/uL (ref 3.4–10.8)

## 2018-06-06 ENCOUNTER — Other Ambulatory Visit: Payer: Self-pay | Admitting: *Deleted

## 2018-06-06 DIAGNOSIS — I712 Thoracic aortic aneurysm, without rupture, unspecified: Secondary | ICD-10-CM

## 2018-06-09 ENCOUNTER — Encounter: Payer: Self-pay | Admitting: *Deleted

## 2018-06-20 ENCOUNTER — Encounter: Payer: Self-pay | Admitting: Cardiovascular Disease

## 2018-06-20 ENCOUNTER — Ambulatory Visit: Payer: Medicare Other | Admitting: Cardiovascular Disease

## 2018-06-20 VITALS — BP 150/78 | HR 56 | Ht 66.5 in | Wt 139.2 lb

## 2018-06-20 DIAGNOSIS — I471 Supraventricular tachycardia: Secondary | ICD-10-CM | POA: Diagnosis not present

## 2018-06-20 DIAGNOSIS — E7800 Pure hypercholesterolemia, unspecified: Secondary | ICD-10-CM | POA: Diagnosis not present

## 2018-06-20 DIAGNOSIS — I10 Essential (primary) hypertension: Secondary | ICD-10-CM

## 2018-06-20 DIAGNOSIS — I712 Thoracic aortic aneurysm, without rupture, unspecified: Secondary | ICD-10-CM

## 2018-06-20 DIAGNOSIS — Z85118 Personal history of other malignant neoplasm of bronchus and lung: Secondary | ICD-10-CM

## 2018-06-20 MED ORDER — LOSARTAN POTASSIUM 50 MG PO TABS: ORAL_TABLET | ORAL | 3 refills | Status: DC

## 2018-06-20 NOTE — Progress Notes (Signed)
Patient ID: Haley [name], female   DOB: [birth year], 84 y.o.   MRN: [id number]     HPI: Haley [name] is a 84 y.o. female who presents to the office today for 3 month cardiology evaluation.  Haley [name]  has a history of superventricular tachycardia in the past has been treated with verapamil as well as beta blocker therapy. Recently, she has done well and now only takes a beta blocker on an as needed basis and admits to only rarely taking this. She has a remote history of breast cancer. She has a history of hiatal hernia with GERD, hyperlipidemia, and also evidence for descending thoracic aorta that is increased at 4.5 cm on a CT scan suggested fusiform aneurysmal dilatation. On the initial CT in 2011 she was found to have a right upper lung nodule. 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 Erasmo [name]. She has had subsequent CT scan is for followup.  Her last CT angiogram of her chest was on 10/23/2014 which demonstrated a stable descending thoracic aortic aneurysm measuring 4.8 cm maximally.  Semiannual imaging follow-up I, CTA or MRI was recommended. She denies recent chest pain. She denies tachycardia palpitations. She denies PND or orthopnea. She denies bleeding.  Laboratory by her primary physician in February 2016: Her hemoglobin and hematocrit were stable at 12.8 and 39.4.  Chemistry profile was normal.  Chest normal renal function.  Lipids are excellent with a total cholesterol 165, triglycerides 131, HDL 67, LDL 72.  She is followed by Haley [name] for her ascending thoracic aortic aneurysm.  I saw her in May 2018  and she  remained stable and has been without recurrent episodes of SVT.  She  saw Haley [name] on Dec 21, 2017 and her most recent CT showed a stable a sending thoracic aneurysm at 4.7 cm in greatest diameter without evidence for dissection.  There were stable  postoperative changes after prior left lung wedge resection.  There was a stable 7 mm dense calcification in the splenic hilum likely representing a small densely calcified splenic artery aneurysm.  She continues to be on six-month follow-up CT imaging per Haley [name].    She wore an event monitor ordered by Haley [name] from April 10 through Dec 02, 2017.  Her predominant rhythm was sinus rhythm.  There were isolated PVCs and PACs.  There were no episodes of ventricular couplets or episodes of atrial fibrillation.  I saw her in August 2019 at which time she denied any chest pain and was on she denies any episodes of chest pain.  She was been on atorvastatin 20 mg for hyperlipidemia.  In February 2019 LDL cholesterol was 86.  She is on metoprolol 12.5 mg and verapamil 120 mg for hypertension as well as her SVT.  When I saw her, I recommended further titration of atorvastatin to 40 mg.  Over the past 3 months, she has continued to do well.  She underwent follow-up laboratory 2 weeks ago and LDL cholesterol was now at 55.  She will be undergoing a repeat CT Angie of her chest on December 3 and has a follow-up appointment with Haley [name] later that day.  She presents for evaluation   Past Medical History:  Diagnosis Date  . Arthritis    knees and left hip  . Atrophic gastritis without mention of hemorrhage   . Breast cancer ([place])   .  Bronchitis, not specified as acute or chronic   . Cancer [facility])    Lung cancer  . Cardiac dysrhythmia, unspecified   . Cataract   . Chronic pancreatitis ([place])   . Complication of anesthesia   . Cough   . Esophageal reflux   . Family history of malignant neoplasm of gastrointestinal tract   . Fatty liver   . Flatulence, eructation, and gas pain   . Gastric erosions    Haley [name]  . Hiatal hernia   . Hoarseness    since virus 2 months agp  . Irritable bowel syndrome   . Irritable bowel syndrome   . LGSIL (low grade squamous intraepithelial dysplasia)  11/15/2014   with biopsy of  benign vaginal cyst  . Malignant neoplasm of breast (female), unspecified site    Skin Cancer legs, face, shoulder, some basal some squamous  . Other chest pain   . Personal history of radiation therapy   . Pneumonia, organism unspecified(486)   . PONV (postoperative nausea and vomiting)    with hemorrhoid surgery years ago, no n/v with breast surgery  . Pure hypercholesterolemia   . Shortness of breath    with Exertion  . Sorethroat    since virus 2 months ago  . Symptomatic menopausal or female climacteric states   . Thoracic aortic aneurysm without rupture ([place]) 08/29/12   08/29/12 discovered on PET/CT  . Tubular adenoma of colon   . Unspecified essential hypertension   . Urinary tract infection, site not specified     Past Surgical History:  Procedure Laterality Date  . BREAST LUMPECTOMY    . BREAST SURGERY    . CATARACT EXTRACTION     Bilateral  . COLONOSCOPY    . EYE SURGERY    . HEMORROIDECTOMY    . SKIN CANCER EXCISION    . VIDEO ASSISTED THORACOSCOPY (VATS)/WEDGE RESECTION Left 09/19/2012   Procedure: LEFT VIDEO ASSISTED THORACOSCOPY (VATS)/LEFT UPPER LOBE WEDGE RESECTION & NODE DISSECTION;  Surgeon: Haley Nakayama, MD;  Location: [place];  Service: Thoracic;  Laterality: Left;    No Known Allergies  Current Outpatient Medications  Medication Sig Dispense Refill  . atorvastatin (LIPITOR) 40 MG tablet Take 1 tablet (40 mg total) by mouth daily. 90 tablet 3  . b complex vitamins tablet Take 1 tablet by mouth daily.     . Cholecalciferol (VITAMIN D3) 10000 units TABS Take 1 tablet by mouth daily.    Marland Kitchen co-enzyme Q-10 30 MG capsule Take 300 mg by mouth daily.    Marland Kitchen losartan (COZAAR) 50 MG tablet Take 1 tablet (50 mg total) by mouth every morning AND 0.5 tablets (25 mg total) every evening. 135 tablet 3  . metoprolol succinate (TOPROL-XL) 25 MG 24 hr tablet Take 0.5 tablets (12.5 mg total) by mouth daily as needed. 15 tablet 3  . Probiotic  Product (PROBIOTIC DAILY PO) Take by mouth.    . verapamil (CALAN-SR) 120 MG CR tablet Take 1 tablet (120 mg total) by mouth daily. 90 tablet 3   No current facility-administered medications for this visit.     Social History   Socioeconomic History  . Marital status: Married    Spouse name: Not on file  . Number of children: 2  . Years of education: Not on file  . Highest education level: Not on file  Occupational History  . Occupation: Retired    Comment: Ecologist  . Financial resource strain: Not on file  . Food insecurity:  Worry: Not on file    Inability: Not on file  . Transportation needs:    Medical: Not on file    Non-medical: Not on file  Tobacco Use  . Smoking status: Never Smoker  . Smokeless tobacco: Never Used  Substance and Sexual Activity  . Alcohol use: No    Alcohol/week: 0.0 standard drinks  . Drug use: No  . Sexual activity: Never    Comment: 1st intercourse 27 yo-1 partner  Lifestyle  . Physical activity:    Days per week: Not on file    Minutes per session: Not on file  . Stress: Not on file  Relationships  . Social connections:    Talks on phone: Not on file    Gets together: Not on file    Attends religious service: Not on file    Active member of club or organization: Not on file    Attends meetings of clubs or organizations: Not on file    Relationship status: Not on file  . Intimate partner violence:    Fear of current or ex partner: Not on file    Emotionally abused: Not on file    Physically abused: Not on file    Forced sexual activity: Not on file  Other Topics Concern  . Not on file  Social History Narrative   Married   Retired   Careers adviser          Family History  Problem Relation Age of Onset  . Colon cancer Father 62  . Cancer Father   . Stroke Father   . Heart disease Mother   . Heart disease Brother   . Cancer Brother        mass behind lung-died age 50  . Multiple myeloma  Brother 75       died age 31  . Diabetes Paternal Aunt     ROS General: Negative; No fevers, chills, or night sweats;  HEENT: Negative; No changes in vision or hearing, sinus congestion, difficulty swallowing Pulmonary: Negative; No cough, wheezing, shortness of breath, hemoptysis Cardiovascular: Negative; No chest pain, presyncope, syncope, palpitations; no recurrent SVT GI: Negative; No nausea, vomiting, diarrhea, or abdominal pain GU: Negative; No dysuria, hematuria, or difficulty voiding Musculoskeletal: Negative; no myalgias, joint pain, or weakness Hematologic/Oncology: Negative; no easy bruising, bleeding Endocrine: Negative; no heat/cold intolerance; no diabetes Neuro: Negative; no changes in balance, headaches Skin: Negative; No rashes or skin lesions Psychiatric: Negative; No behavioral problems, depression Sleep: Negative; No snoring, daytime sleepiness, hypersomnolence, bruxism, restless legs, hypnogognic hallucinations, no cataplexy Other comprehensive 14 point system review is negative.   PE BP (!) 150/78   Pulse (!) 56   Ht 5' 6.5" (1.689 m)   Wt 139 lb 3.2 oz (63.1 kg)   BMI 22.13 kg/m    Repeat blood pressure by me was 134/78.  Wt Readings from Last 3 Encounters:  06/20/18 139 lb 3.2 oz (63.1 kg)  03/23/18 138 lb 12.8 oz (63 kg)  12/21/17 141 lb (64 kg)   General: Alert, oriented, no distress.  Skin: normal turgor, no rashes, warm and dry HEENT: Normocephalic, atraumatic. Pupils equal round and reactive to light; sclera anicteric; extraocular muscles intact;  Nose without nasal septal hypertrophy Mouth/Parynx benign; Mallinpatti scale 2 Neck: No JVD, no carotid bruits; normal carotid upstroke Lungs: clear to ausculatation and percussion; no wheezing or rales Chest wall: without tenderness to palpitation Heart: PMI not displaced, RRR, s1 s2 normal, 1/6 systolic murmur, no  diastolic murmur, no rubs, gallops, thrills, or heaves Abdomen: soft, nontender; no  hepatosplenomehaly, BS+; abdominal aorta nontender and not dilated by palpation. Back: no CVA tenderness Pulses 2+ Musculoskeletal: full range of motion, normal strength, no joint deformities Extremities: no clubbing cyanosis or edema, Homan's sign negative  Neurologic: grossly nonfocal; Cranial nerves grossly wnl Psychologic: Normal mood and affect   ECG (independently read by me): Sinus bradycardia 56 bpm.  First-degree AV block.  Able to 60 ms.  No ectopy.  No significant ST changes.  August 2019 ECG (independently read by me): Sinus bradycardia 53 bpm.  First-degree AV block with a PR interval of 216 ms.  Poor R wave progression V1 through V3.  May 2018 ECG (independently read by me): Sinus bradycardia 56 bpm.  Mild sinus arrhythmia.  Poor R-wave progression V1 V2 2.  Normal intervals.  April 2016 ECG (independently read by me): Sinus bradycardia at 51 bpm.  Poor progression V1 through V3.  QTc interval 420 ms.  September 2014 ECG: Sinus rhythm 58 beats per minute. Intervals normal.  LABS: I personally reviewed  laboratory done on 09/10/2016 by Harlene Ramus, M.D., done at [facility]  at [place] & Units 06/02/2018 09/05/2015 05/20/2015  Glucose 65 - 99 mg/dL 84 86 -  BUN 8 - 27 mg/dL 14 14 -  Creatinine 0.57 - 1.00 mg/dL 0.81 0.80 0.9  BUN/Creat Ratio 12 - 28 17 - -  Sodium 134 - 144 mmol/L 141 140 -  Potassium 3.5 - 5.2 mmol/L 4.9 4.0 -  Chloride 96 - 106 mmol/L 102 106 -  CO2 20 - 29 mmol/L 25 22 -  Calcium 8.7 - 10.3 mg/dL 9.3 8.9 -    Hepatic Function Latest Ref Rng & Units 06/02/2018 09/05/2015 09/11/2014  Total Protein 6.0 - 8.5 g/dL 6.8 6.7 6.6  Albumin 3.5 - 4.7 g/dL 4.5 3.9 3.9  AST 0 - 40 IU/L '27 17 14  ' ALT 0 - 32 IU/L '15 10 9  ' Alk Phosphatase 39 - 117 IU/L 72 59 56  Total Bilirubin 0.0 - 1.2 mg/dL 1.2 1.1 1.1    CBC Latest Ref Rng & Units 06/02/2018 09/05/2015 09/11/2014  WBC 3.4 - 10.8 x10E3/uL 4.6 3.8(L) 4.7  Hemoglobin 11.1 - 15.9 g/dL  12.4 12.2 12.8  Hematocrit 34.0 - 46.6 % 37.1 36.3 39.4  Platelets 150 - 450 x10E3/uL 284 240 294   Lab Results  Component Value Date   TSH 4.002 07/05/2012    BNP No results found for: PROBNP  Lipid Panel     Component Value Date/Time   CHOL 155 06/02/2018 1041   TRIG 84 06/02/2018 1041   HDL 83 06/02/2018 1041   CHOLHDL 1.9 06/02/2018 1041   CHOLHDL 1.9 09/05/2015 0854   VLDL 19 09/05/2015 0854   LDLCALC 55 06/02/2018 1041     RADIOLOGY: No results found.  IMPRESSION:  1. Essential hypertension   2. Thoracic aortic aneurysm without rupture ([place])   3. Hypercholesterolemia   4. Paroxysmal SVT (supraventricular tachycardia) ([place])   5. History of lung cancer: Stage Ia adenocarcinoma with lipidic spread in the left upper lobe treated with wedge resection 2014     ASSESSMENT AND PLAN: Ms. Sammarco is an 84 year old female who continues to do well from a cardiovascular standpoint. She has a history of paroxysmal supraventricular tachycardia and this has continued to be well controlled with combination verapamil 120 mg in addition to Toprol-XL 12.5 mg.  Remotely, her verapamil dose was  reduced due to bradycardia.  She is maintaining sinus rhythm and heart rate today is stable at 56.  Her blood pressure today was increased on presentation and improved on repeat.  However, with her abdominal aortic aneurysm I have recommended more strict blood pressure control.  She has been taking losartan 50 mg daily, verapamil 120 mg and takes metoprolol on a as needed basis.  I have recommended further titration of losartan to 50 mg in the morning and 25 mg in the evening.  She is now on atorvastatin 40 mg.  Recent lipid studies are significantly improved with LDL cholesterol now at 55.  She will be seeing Haley [name] on December 3 and will have a follow-up CT Angie of her chest and aorta the morning of that evaluation.  She is status post left upper lobe wedge resection in 2014 for adenocarcinoma.   I will see her in 6 months for cardiology reevaluation.  Time spent: 25 minutes Troy Sine, MD, [facility]  06/21/2018 4:32 PM

## 2018-06-20 NOTE — Patient Instructions (Signed)
Medication Instructions:  INCREASE Losartan to 50 mg in the AM and 25 mg in the PM  If you need a refill on your cardiac medications before your next appointment, please call your pharmacy.   Follow-Up: At [facility], you and your health needs are our priority.  As part of our continuing mission to provide you with exceptional heart care, we have created designated Provider Care Teams.  These Care Teams include your primary Cardiologist (physician) and Advanced Practice Providers (APPs -  Physician Assistants and Nurse Practitioners) who all work together to provide you with the care you need, when you need it. You will need a follow up appointment in 6 months.  Please call our office 2 months in advance to schedule this appointment.  You may see Dr. Claiborne Billings or one of the following Advanced Practice Providers on your designated Care Team: [place], [place] . Fabian Sharp, PA-C

## 2018-06-21 ENCOUNTER — Encounter: Payer: Self-pay | Admitting: Cardiovascular Disease

## 2018-06-28 ENCOUNTER — Ambulatory Visit
Admission: RE | Admit: 2018-06-28 | Discharge: 2018-06-28 | Disposition: A | Discharge status: Home / Self Care | Payer: Medicare Other | Source: Ambulatory Visit | Attending: Thoracic Surgery (Cardiothoracic Vascular Surgery) | Admitting: Thoracic Surgery (Cardiothoracic Vascular Surgery)

## 2018-06-28 ENCOUNTER — Other Ambulatory Visit: Payer: Self-pay

## 2018-06-28 ENCOUNTER — Ambulatory Visit: Payer: Medicare Other | Admitting: Thoracic Surgery (Cardiothoracic Vascular Surgery)

## 2018-06-28 ENCOUNTER — Encounter: Payer: Self-pay | Admitting: Thoracic Surgery (Cardiothoracic Vascular Surgery)

## 2018-06-28 VITALS — BP 138/80 | HR 64 | Resp 18 | Ht 66.5 in | Wt 138.4 lb

## 2018-06-28 DIAGNOSIS — I7121 Aneurysm of the ascending aorta, without rupture: Secondary | ICD-10-CM

## 2018-06-28 DIAGNOSIS — I712 Thoracic aortic aneurysm, without rupture, unspecified: Secondary | ICD-10-CM

## 2018-06-28 DIAGNOSIS — Z85118 Personal history of other malignant neoplasm of bronchus and lung: Secondary | ICD-10-CM

## 2018-06-28 MED ORDER — IOPAMIDOL (ISOVUE-370) INJECTION 76%
75.0000 mL | Freq: Once | INTRAVENOUS | Status: AC | PRN
  Administered 2018-06-28: 75 mL via INTRAVENOUS

## 2018-06-28 NOTE — Progress Notes (Signed)
[address]       [place], [address]             [phone]     HPI: [name] returns for scheduled visit to follow-up her ascending aneurysm.  Haley [name] is an 84 year old woman with a history of stage Ia adenocarcinoma of the left upper lobe resected in 2014, an ascending aneurysm, hypertension, hyperlipidemia, supraventricular tachycardia, breast cancer, skin cancer, chronic pancreatitis, irritable bowel syndrome, and reflux.  She was noted to have a 4.5 cm aneurysm on the PET/CT prior to her lung resection in 2014.  She has been followed since that time.  Most recently it was 4.7 cm in May 2019.  She recently saw Dr. Claiborne Billings.  He started her on losartan because of her blood pressure being elevated.  She remains on metoprolol and verapamil as well.  She has not been having any chest pain, shortness of breath, or peripheral edema.  She continues to have issues with occasional SVT and bradycardia.   Past Medical History:  Diagnosis Date  . Arthritis    knees and left hip  . Atrophic gastritis without mention of hemorrhage   . Breast cancer ([place])   . Bronchitis, not specified as acute or chronic   . Cancer [facility])    Lung cancer  . Cardiac dysrhythmia, unspecified   . Cataract   . Chronic pancreatitis ([place])   . Complication of anesthesia   . Cough   . Esophageal reflux   . Family history of malignant neoplasm of gastrointestinal tract   . Fatty liver   . Flatulence, eructation, and gas pain   . Gastric erosions    Haley [name]  . Hiatal hernia   . Hoarseness    since virus 2 months agp  . Irritable bowel syndrome   . Irritable bowel syndrome   . LGSIL (low grade squamous intraepithelial dysplasia) 11/15/2014   with biopsy of  benign vaginal cyst  . Malignant neoplasm of breast (female), unspecified site    Skin Cancer legs, face, shoulder, some basal some squamous  . Other chest pain   . Personal history of radiation therapy   . Pneumonia, organism  unspecified(486)   . PONV (postoperative nausea and vomiting)    with hemorrhoid surgery years ago, no n/v with breast surgery  . Pure hypercholesterolemia   . Shortness of breath    with Exertion  . Sorethroat    since virus 2 months ago  . Symptomatic menopausal or female climacteric states   . Thoracic aortic aneurysm without rupture ([place]) 08/29/12   08/29/12 discovered on PET/CT  . Tubular adenoma of colon   . Unspecified essential hypertension   . Urinary tract infection, site not specified     Current Outpatient Medications  Medication Sig Dispense Refill  . atorvastatin (LIPITOR) 40 MG tablet Take 1 tablet (40 mg total) by mouth daily. 90 tablet 3  . b complex vitamins tablet Take 1 tablet by mouth daily.     . Cholecalciferol (VITAMIN D3) 10000 units TABS Take 1 tablet by mouth daily.    Marland Kitchen co-enzyme Q-10 30 MG capsule Take 300 mg by mouth daily.    Marland Kitchen losartan (COZAAR) 50 MG tablet Take 1 tablet (50 mg total) by mouth every morning AND 0.5 tablets (25 mg total) every evening. 135 tablet 3  . metoprolol succinate (TOPROL-XL) 25 MG 24 hr tablet Take 0.5 tablets (12.5 mg total) by mouth daily as needed. 15 tablet  3  . Probiotic Product (PROBIOTIC DAILY PO) Take by mouth.    . verapamil (CALAN-SR) 120 MG CR tablet Take 1 tablet (120 mg total) by mouth daily. 90 tablet 3   No current facility-administered medications for this visit.     Physical Exam BP 138/80 (BP Location: Left Arm, Patient Position: Sitting, Cuff Size: Normal)   Pulse 64   Resp 18   Ht 5' 6.5" (1.689 m)   Wt 138 lb 6.4 oz (62.8 kg)   SpO2 98% Comment: RA  BMI 22.19 kg/m  84 year old woman in no acute distress Alert and oriented x3 with no focal deficits Cardiac regular rate and rhythm normal S1 and S2, faint systolic murmur No carotid bruits Peripheral pulses intact Lungs clear with equal breath sounds bilaterally  Diagnostic Tests: CT ANGIOGRAPHY CHEST WITH CONTRAST  TECHNIQUE: Multidetector CT  imaging of the chest was performed using the standard protocol during bolus administration of intravenous contrast. Multiplanar CT image reconstructions and MIPs were obtained to evaluate the vascular anatomy.  CONTRAST:  69mL ISOVUE-370 IOPAMIDOL (ISOVUE-370) INJECTION 76%  COMPARISON:  12/21/2017  FINDINGS: Cardiovascular: Maximal diameters of the ascending aorta at the sinus of Valsalva, sino-tubular junction, and ascending aorta are 4.3 cm, 3.8 cm, and 4.7 cm respectively. This compares with a maximal diameter of 4.7 cm on the prior study. There is no evidence of aortic dissection. There is no obvious acute intramural hematoma. Atherosclerotic calcifications of the descending thoracic aorta are noted. Great vessels are patent. Vertebral arteries are patent. There is no obvious evidence of acute pulmonary thromboembolism. No significant coronary artery calcifications.  Mediastinum/Nodes: No abnormal mediastinal adenopathy or pericardial effusion. Thyroid is atrophic. Esophagus is unremarkable.  Lungs/Pleura: Scarring at the right lung apex. Scarring in the posterior left upper lobe with postoperative changes. Subsegmental atelectasis versus scarring towards the anterior and basilar right middle lobe. No new nodule or consolidation. No pneumothorax or pleural effusion.  Upper Abdomen: No acute abnormality. Stable splenic calcification at the splenic hilum.  Musculoskeletal: No vertebral compression deformity.  Review of the MIP images confirms the above findings.  IMPRESSION: Stable ascending thoracic aortic aneurysm measuring 4.7 cm. Ascending thoracic aortic aneurysm. Recommend semi-annual imaging followup by CTA or MRA and referral to cardiothoracic surgery if not already obtained. This recommendation follows 2010 ACCF/AHA/AATS/ACR/ASA/SCA/SCAI/SIR/STS/SVM Guidelines for the Diagnosis and Management of Patients With Thoracic Aortic Disease. Circulation. 2010;  121: O973-Z329  Stable chronic changes.  Aortic Atherosclerosis (ICD10-I70.0).   Electronically Signed   By: Marybelle Killings M.D.   On: 06/28/2018 09:43 I personally reviewed the CT images and concur with the findings noted above  Impression: Haley [name] is an 84 year old woman with a 4.7 cm ascending aneurysm.  It was originally 4.5 cm in first noted in 2014.  There is been no interval change over the past year.  Hypertension-blood pressure better controlled with addition of losartan.  Hyperlipidemia-Dr. Claiborne Billings increased her Lipitor at her last visit.  Stage Ia adenocarcinoma left upper lobe-resected 2014.  Now 5 years out with no evidence of recurrence.  Plan: Return in 6 months with CT Angio of chest  Melrose Nakayama, MD [facility] [phone]

## 2018-07-07 NOTE — Addendum Note (Signed)
Addended by: Leland Johns A on: 07/07/2018 07:35 AM   Modules accepted: Orders

## 2018-10-25 ENCOUNTER — Other Ambulatory Visit: Payer: Self-pay | Admitting: Thoracic Surgery (Cardiothoracic Vascular Surgery)

## 2018-10-25 DIAGNOSIS — I712 Thoracic aortic aneurysm, without rupture, unspecified: Secondary | ICD-10-CM

## 2018-10-25 DIAGNOSIS — I7121 Aneurysm of the ascending aorta, without rupture: Secondary | ICD-10-CM

## 2018-11-01 ENCOUNTER — Other Ambulatory Visit: Payer: Self-pay | Admitting: Adult Health

## 2018-11-01 MED ORDER — LOSARTAN POTASSIUM 50 MG PO TABS: ORAL_TABLET | ORAL | 3 refills | Status: DC

## 2018-12-08 ENCOUNTER — Telehealth: Payer: Medicare Other | Admitting: Cardiovascular Disease

## 2018-12-22 ENCOUNTER — Ambulatory Visit
Admission: RE | Admit: 2018-12-22 | Discharge: 2018-12-22 | Disposition: A | Discharge status: Home / Self Care | Payer: Medicare Other | Source: Ambulatory Visit | Attending: Thoracic Surgery (Cardiothoracic Vascular Surgery) | Admitting: Thoracic Surgery (Cardiothoracic Vascular Surgery)

## 2018-12-22 DIAGNOSIS — I712 Thoracic aortic aneurysm, without rupture, unspecified: Secondary | ICD-10-CM

## 2018-12-22 MED ORDER — IOPAMIDOL (ISOVUE-370) INJECTION 76%
75.0000 mL | Freq: Once | INTRAVENOUS | Status: AC | PRN
  Administered 2018-12-22: 10:00:00 75 mL via INTRAVENOUS

## 2018-12-27 ENCOUNTER — Ambulatory Visit: Payer: Medicare Other | Admitting: Thoracic Surgery (Cardiothoracic Vascular Surgery)

## 2019-01-02 ENCOUNTER — Telehealth: Payer: Self-pay | Admitting: Cardiovascular Disease

## 2019-01-02 NOTE — Telephone Encounter (Signed)
Phone visit/call home phone/my chart/pre reg complete/consent obtained -- ttf

## 2019-01-10 ENCOUNTER — Telehealth (INDEPENDENT_AMBULATORY_CARE_PROVIDER_SITE_OTHER): Payer: Medicare Other | Admitting: Cardiovascular Disease

## 2019-01-10 ENCOUNTER — Encounter: Payer: Self-pay | Admitting: Cardiovascular Disease

## 2019-01-10 VITALS — Ht 66.0 in | Wt 137.0 lb

## 2019-01-10 DIAGNOSIS — Z85118 Personal history of other malignant neoplasm of bronchus and lung: Secondary | ICD-10-CM

## 2019-01-10 DIAGNOSIS — I712 Thoracic aortic aneurysm, without rupture, unspecified: Secondary | ICD-10-CM

## 2019-01-10 DIAGNOSIS — I10 Essential (primary) hypertension: Secondary | ICD-10-CM | POA: Diagnosis not present

## 2019-01-10 DIAGNOSIS — E7800 Pure hypercholesterolemia, unspecified: Secondary | ICD-10-CM

## 2019-01-10 DIAGNOSIS — K219 Gastro-esophageal reflux disease without esophagitis: Secondary | ICD-10-CM

## 2019-01-10 DIAGNOSIS — I471 Supraventricular tachycardia: Secondary | ICD-10-CM

## 2019-01-10 NOTE — Progress Notes (Signed)
Virtual Visit via Telephone Note   This visit type was conducted due to national recommendations for restrictions regarding the COVID-19 Pandemic (e.g. social distancing) in an effort to limit this patient's exposure and mitigate transmission in our community.  Due to her co-morbid illnesses, this patient is at least at moderate risk for complications without adequate follow up.  This format is felt to be most appropriate for this patient at this time.  The patient did not have access to video technology/had technical difficulties with video requiring transitioning to audio format only (telephone).  All issues noted in this document were discussed and addressed.  No physical exam could be performed with this format.  Please refer to the patient's chart for her  consent to telehealth for [facility]   Date:  01/10/2019   ID:  Haley, [name] [birth year], MRN [id number]  Patient Location: Home Provider Location: Office  PCP:  Aretta Nip, MD  Cardiologist:  Shelva Majestic, MD Electrophysiologist:  None   Evaluation Performed:  Follow-Up Visit  Chief Complaint:  7 month F/U  History of Present Illness:    Haley [name] is a 85 y.o. female who has a history of supraventricular tachycardia in the past has been treated with verapamil as well as beta blocker therapy. She has done well and now only takes a beta blocker on an as needed basis and admits to only rarely taking this. She has a remote history of breast cancer. She has a history of hiatal hernia with GERD, hyperlipidemia, and also evidence for descending thoracic aorta that is increased at 4.5 cm on a CT scan suggested fusiform aneurysmal dilatation. On the initial CT in 2011 she was found to have a right upper lung nodule. 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 [name]. She has had subsequent CT scan is for followup.  Her last CT  angiogram of her chest was on 10/23/2014 which demonstrated a stable descending thoracic aortic aneurysm measuring 4.8 cm maximally.  Semiannual imaging follow-up I, CTA or MRI was recommended. She denies recent chest pain. She denies tachycardia palpitations. She denies PND or orthopnea. She denies bleeding.  Laboratory by her primary physician in February 2016: Her hemoglobin and hematocrit were stable at 12.8 and 39.4.  Chemistry profile was normal.  Chest normal renal function.  Lipids are excellent with a total cholesterol 165, triglycerides 131, HDL 67, LDL 72.  She is followed by [name] for her ascending thoracic aortic aneurysm.  I saw her in May 2018  and she  remained stable and has been without recurrent episodes of SVT.  She  saw [name] on Dec 21, 2017 and her most recent CT showed a stable a sending thoracic aneurysm at 4.7 cm in greatest diameter without evidence for dissection.  There were stable postoperative changes after prior left lung wedge resection.  There was a stable 7 mm dense calcification in the splenic hilum likely representing a small densely calcified splenic artery aneurysm.  She continues to be on six-month follow-up CT imaging per [name].    She wore an event monitor ordered by [name] from April 10 through Dec 02, 2017.  Her predominant rhythm was sinus rhythm.  There were isolated PVCs and PACs.  There were no episodes of ventricular couplets or episodes of atrial fibrillation.  I saw her in August 2019 at which time she denied any chest pain  and was on she denies any episodes of chest pain.  She was been on atorvastatin 20 mg for hyperlipidemia.  In February 2019 LDL cholesterol was 86.  She is on metoprolol 12.5 mg and verapamil 120 mg for hypertension as well as her SVT.  When I saw her, I recommended further titration of atorvastatin to 40 mg.  Over the past 3 months, she has continued to do well.  She underwent follow-up  laboratory 2 weeks ago and LDL cholesterol was now at 55.  She underwent a repeat CT Angio of her chest on December 3 and saw [name] in follow-up. At that time she had a stable ascending thoracic aneurysm measuring 4.7 cm.  She recently underwent a follow-up 55-monthevaluation with CT imaging on Dec 22, 2018.   Anterior ascending thoracic aneurysm remains stable measuring 4.6 cm maximally.  She also had ascending aortic atherosclerosis.  There was no acute cardiopulmonary disease.  She is scheduled to see Dr. HRoxan Hockeyin follow-up.  Presently she feels well.  Her blood pressures have been running stable in the 118/70-120 2/77 range.  She denies chest pain palpitations shortness of breath.  She has GERD.  Recent laboratory by her primary physician [name] March 4 had shown excellent lipid studies with total cholesterol 145, HDL 74, triglycerides 91 and LDL 53.  Total bilirubin was 1.5 and ALT and AST were normal.  She presents for evaluation.   The patient does not have symptoms concerning for COVID-19 infection (fever, chills, cough, or new shortness of breath).    Past Medical History:  Diagnosis Date   Arthritis    knees and left hip   Atrophic gastritis without mention of hemorrhage    Breast cancer ([facility]    Bronchitis, not specified as acute or chronic    Cancer ([facility] [place]    Lung cancer   Cardiac dysrhythmia, unspecified    Cataract    Chronic pancreatitis ([facility])    Complication of anesthesia    Cough    Esophageal reflux    Family history of malignant neoplasm of gastrointestinal tract    Fatty liver    Flatulence, eructation, and gas pain    Gastric erosions    [place]   Hiatal hernia    Hoarseness    since virus 2 months agp   Irritable bowel syndrome    Irritable bowel syndrome    LGSIL (low grade squamous intraepithelial dysplasia) 11/15/2014   with biopsy of  benign vaginal cyst   Malignant neoplasm of breast (female), unspecified  site    Skin Cancer legs, face, shoulder, some basal some squamous   Other chest pain    Personal history of radiation therapy    Pneumonia, organism unspecified(486)    PONV (postoperative nausea and vomiting)    with hemorrhoid surgery years ago, no n/v with breast surgery   Pure hypercholesterolemia    Shortness of breath    with Exertion   Sorethroat    since virus 2 months ago   Symptomatic menopausal or female climacteric states    Thoracic aortic aneurysm without rupture ([facility] 08/29/12   08/29/12 discovered on PET/CT   Tubular adenoma of colon    Unspecified essential hypertension    Urinary tract infection, site not specified    Past Surgical History:  Procedure Laterality Date   BREAST LUMPECTOMY     BREAST SURGERY     CATARACT EXTRACTION     Bilateral   COLONOSCOPY  EYE SURGERY     HEMORROIDECTOMY     SKIN CANCER EXCISION     VIDEO ASSISTED THORACOSCOPY (VATS)/WEDGE RESECTION Left 09/19/2012   Procedure: LEFT VIDEO ASSISTED THORACOSCOPY (VATS)/LEFT UPPER LOBE WEDGE RESECTION & NODE DISSECTION;  Surgeon: Melrose Nakayama, MD;  Location: [facility] OR;  Service: Thoracic;  Laterality: Left;     Current Meds  Medication Sig   atorvastatin (LIPITOR) 40 MG tablet Take 1 tablet (40 mg total) by mouth daily.   b complex vitamins tablet Take 1 tablet by mouth daily.    Cholecalciferol (VITAMIN D3) 10000 units TABS Take 1 tablet by mouth daily.   co-enzyme Q-10 30 MG capsule Take 300 mg by mouth daily.   losartan (COZAAR) 50 MG tablet Take 1 tablet (50 mg total) by mouth every morning AND 0.5 tablets (25 mg total) every evening.   metoprolol succinate (TOPROL-XL) 25 MG 24 hr tablet Take 0.5 tablets (12.5 mg total) by mouth daily as needed.   Omega-3 1000 MG CAPS Take by mouth.   Probiotic Product (PROBIOTIC DAILY PO) Take by mouth.   verapamil (CALAN-SR) 120 MG CR tablet TAKE 1 TABLET BY MOUTH  DAILY     Allergies:   Patient has no known  allergies.   Social History   Tobacco Use   Smoking status: Never Smoker   Smokeless tobacco: Never Used  Substance Use Topics   Alcohol use: No    Alcohol/week: 0.0 standard drinks   Drug use: No     Family Hx: The patient's family history includes Cancer in her brother and father; Colon cancer (age of onset: 90) in her father; Diabetes in her paternal aunt; Heart disease in her brother and mother; Multiple myeloma (age of onset: 10) in her brother; Stroke in her father.  ROS:   Please see the history of present illness.    No fevers chills night sweats No cough or wheezing Remote history of breast CA No recurrent episodes of palpitations, chest pain PND orthopnea History of hiatal hernia with GERD No abdominal discomfort No nausea vomiting or diarrhea No heat or cold intolerance or diabetes No rashes or skin lesions  No change in balance or headaches or neurologic symptoms Sleeping well  All other systems reviewed and are negative.   Prior CV studies:   The following studies were reviewed today:  CT ANGIO FINDINGS: 12/22/2018 Cardiovascular: Maximum ascending thoracic aortic diameter 4.6 cm, stable. Heart is borderline in size. Proximal aortic arch measures 3.5 cm. Proximal descending thoracic aorta measures 2.5 cm. Atherosclerotic calcifications in the descending thoracic aorta. No dissection.  Mediastinum/Nodes: No mediastinal, hilar, or axillary adenopathy.  Lungs/Pleura: Scarring at the right apex is stable. Scarring in the posterior left upper lobe with postoperative changes, stable. No confluent opacities or effusions.  Upper Abdomen: Imaging into the upper abdomen shows no acute findings.  Musculoskeletal: Chest wall soft tissues are unremarkable. No acute bony abnormality.  Review of the MIP images confirms the above findings.  IMPRESSION: Stable ascending thoracic aortic aneurysm, 4.6 cm maximally.  Descending aortic  atherosclerosis.  No acute cardiopulmonary disease.    Labs/Other Tests and Data Reviewed:    EKG:    Recent Labs: 06/02/2018: ALT 15; BUN 14; Creatinine, Ser 0.81; Hemoglobin 12.4; Platelets 284; Potassium 4.9; Sodium 141   Recent Lipid Panel Lab Results  Component Value Date/Time   CHOL 155 06/02/2018 10:41 AM   TRIG 84 06/02/2018 10:41 AM   HDL 83 06/02/2018 10:41 AM   CHOLHDL 1.9 06/02/2018  10:41 AM   CHOLHDL 1.9 09/05/2015 08:54 AM   LDLCALC 55 06/02/2018 10:41 AM    Wt Readings from Last 3 Encounters:  01/10/19 137 lb (62.1 kg)  06/28/18 138 lb 6.4 oz (62.8 kg)  06/20/18 139 lb 3.2 oz (63.1 kg)     Objective:    Vital Signs:  Ht _0  (1.676 m)    Wt 137 lb (62.1 kg)    BMI 22.11 kg/m    Since this was a phone visit I could not perform a visual physical exam. She states there is not been any change in her physical appearance Breathing is normal and not labored. There is no audible wheezes Palpation of her pulse revealed this to be regular There is no chest wall discomfort or abdominal discomfort She denies edema No neurologic issues Normal cognition, mood and affect  ASSESSMENT & PLAN:    1. Essential hypertension: Blood pressure today is stable and improved on losartan 50 mg in the morning 25 mg at night, and verapamil 120 mg at night. 2. History of SVT: No recurrent arrhythmia.  She has a prescription for Toprol-XL to take on a as needed basis 3. Thoracic aortic aneurysm without rupture: CT images from December 2019 as well as May 2020 were reviewed.  This remained stable most recent CT maximum diameter was 4.6 cm.  Follow-up as scheduled with [name] 4. History of stage Ia adenocarcinoma of her lung with spread to the left upper lobe treated with wedge resection 2014. 5. Hyperlipidemia: Lipid studies have improved on atorvastatin 40 mg with most recent LDL at 55 6. GERD: Controlled  COVID-19 Education: The signs and symptoms of COVID-19 were  discussed with the patient and how to seek care for testing (follow up with PCP or arrange. E-visit).  The importance of social distancing was discussed today.  Time:   Today, I have spent 22 minutes with the patient with telehealth technology discussing the above problems.     Medication Adjustments/Labs and Tests Ordered: Current medicines are reviewed at length with the patient today.  Concerns regarding medicines are outlined above.   Tests Ordered: No orders of the defined types were placed in this encounter.   Medication Changes: No orders of the defined types were placed in this encounter.   Follow Up: 6 months  Signed, Shelva Majestic, MD  01/10/2019 1:32 PM    [facility]

## 2019-01-10 NOTE — Patient Instructions (Signed)

## 2019-02-06 ENCOUNTER — Other Ambulatory Visit: Payer: Self-pay

## 2019-02-07 ENCOUNTER — Ambulatory Visit: Payer: Medicare Other | Admitting: Thoracic Surgery (Cardiothoracic Vascular Surgery)

## 2019-02-07 ENCOUNTER — Encounter: Payer: Self-pay | Admitting: Thoracic Surgery (Cardiothoracic Vascular Surgery)

## 2019-02-07 VITALS — BP 129/75 | HR 60 | Temp 97.5°F | Resp 16 | Ht 66.0 in | Wt 138.2 lb

## 2019-02-07 DIAGNOSIS — I712 Thoracic aortic aneurysm, without rupture: Secondary | ICD-10-CM

## 2019-02-07 DIAGNOSIS — Z85118 Personal history of other malignant neoplasm of bronchus and lung: Secondary | ICD-10-CM | POA: Diagnosis not present

## 2019-02-07 DIAGNOSIS — I7121 Aneurysm of the ascending aorta, without rupture: Secondary | ICD-10-CM

## 2019-02-07 NOTE — Progress Notes (Signed)
[address]       [place] [address]             [phone]     HPI: [name] returns for scheduled follow-up visit  Haley [name] is an 85 year old woman with a history of lung cancer, hypertension, hyperlipidemia, supraventricular tachycardia, breast cancer, skin cancer, chronic pancreatitis, irritable bowel syndrome, reflux, and an ascending aortic aneurysm.  I did a wedge resection for a stage Ia adenocarcinoma with lipidic spread in 2014.  She was found to have an ascending aneurysm at that time.  It measured 4.5 cm.  I last saw her in the office on 12/21/2017.  The aneurysm was 4.7 cm.  In the interim since her last visit she has been feeling well.  She does have some reflux.  She denies any chest pain, pressure, tightness, or shortness of breath.  She does not have any problems with swelling.  Past Medical History:  Diagnosis Date  . Arthritis    knees and left hip  . Atrophic gastritis without mention of hemorrhage   . Breast cancer ([place])   . Bronchitis, not specified as acute or chronic   . Cancer [facility])    Lung cancer  . Cardiac dysrhythmia, unspecified   . Cataract   . Chronic pancreatitis ([place])   . Complication of anesthesia   . Cough   . Esophageal reflux   . Family history of malignant neoplasm of gastrointestinal tract   . Fatty liver   . Flatulence, eructation, and gas pain   . Gastric erosions    Haley [name]  . Hiatal hernia   . Hoarseness    since virus 2 months agp  . Irritable bowel syndrome   . Irritable bowel syndrome   . LGSIL (low grade squamous intraepithelial dysplasia) 11/15/2014   with biopsy of  benign vaginal cyst  . Malignant neoplasm of breast (female), unspecified site    Skin Cancer legs, face, shoulder, some basal some squamous  . Other chest pain   . Personal history of radiation therapy   . Pneumonia, organism unspecified(486)   . PONV (postoperative nausea and vomiting)    with hemorrhoid surgery years ago, no  n/v with breast surgery  . Pure hypercholesterolemia   . Shortness of breath    with Exertion  . Sorethroat    since virus 2 months ago  . Symptomatic menopausal or female climacteric states   . Thoracic aortic aneurysm without rupture ([place]) 08/29/12   08/29/12 discovered on PET/CT  . Tubular adenoma of colon   . Unspecified essential hypertension   . Urinary tract infection, site not specified     Current Outpatient Medications  Medication Sig Dispense Refill  . atorvastatin (LIPITOR) 40 MG tablet Take 1 tablet (40 mg total) by mouth daily. 90 tablet 3  . b complex vitamins tablet Take 1 tablet by mouth daily.     . Cholecalciferol (VITAMIN D3) 10000 units TABS Take 1 tablet by mouth daily.    Marland Kitchen co-enzyme Q-10 30 MG capsule Take 300 mg by mouth daily.    Marland Kitchen losartan (COZAAR) 50 MG tablet Take 1 tablet (50 mg total) by mouth every morning AND 0.5 tablets (25 mg total) every evening. 135 tablet 3  . metoprolol succinate (TOPROL-XL) 25 MG 24 hr tablet Take 0.5 tablets (12.5 mg total) by mouth daily as needed. 15 tablet 3  . Omega-3 1000 MG CAPS Take by mouth.    . Probiotic Product (  PROBIOTIC DAILY PO) Take by mouth.    . verapamil (CALAN-SR) 120 MG CR tablet TAKE 1 TABLET BY MOUTH  DAILY 90 tablet 3   No current facility-administered medications for this visit.     Physical Exam BP 129/75 (BP Location: Right Arm, Patient Position: Sitting, Cuff Size: Normal)   Pulse 60   Temp (!) 97.5 F (36.4 C) Comment: thermal  Resp 16   Ht 5\' 6"  (1.676 m)   Wt 138 lb 3.2 oz (62.7 kg)   SpO2 95% Comment: RA  BMI 22.56 kg/m  85 year old woman in no acute distress Alert and oriented x3 with no focal deficits Cardiac regular rate and rhythm normal S1 and S2 Lungs clear with equal breath sounds bilaterally No carotid bruits No peripheral edema  Diagnostic Tests: CT ANGIOGRAPHY CHEST WITH CONTRAST  TECHNIQUE: Multidetector CT imaging of the chest was performed using the standard protocol  during bolus administration of intravenous contrast. Multiplanar CT image reconstructions and MIPs were obtained to evaluate the vascular anatomy.  CONTRAST:  9mL ISOVUE-370 IOPAMIDOL (ISOVUE-370) INJECTION 76%  COMPARISON:  12/21/2017, 06/28/2018  FINDINGS: Cardiovascular: Maximum ascending thoracic aortic diameter 4.6 cm, stable. Heart is borderline in size. Proximal aortic arch measures 3.5 cm. Proximal descending thoracic aorta measures 2.5 cm. Atherosclerotic calcifications in the descending thoracic aorta. No dissection.  Mediastinum/Nodes: No mediastinal, hilar, or axillary adenopathy.  Lungs/Pleura: Scarring at the right apex is stable. Scarring in the posterior left upper lobe with postoperative changes, stable. No confluent opacities or effusions.  Upper Abdomen: Imaging into the upper abdomen shows no acute findings.  Musculoskeletal: Chest wall soft tissues are unremarkable. No acute bony abnormality.  Review of the MIP images confirms the above findings.  IMPRESSION: Stable ascending thoracic aortic aneurysm, 4.6 cm maximally.  Descending aortic atherosclerosis.  No acute cardiopulmonary disease.   Electronically Signed   By: Rolm Baptise M.D.   On: 12/22/2018 10:02  I personally reviewed the CT images and concur with the findings noted above  Impression: Haley [name] is an 85 year old woman with a past medical history significant for stage Ia adenocarcinoma of the lung, hypertension, hyperlipidemia, supraventricular tachycardia, gastroesophageal reflux, and a 4.6 cm ascending aneurysm.  Ascending aneurysm/thoracic aortic atherosclerosis-aneurysm stable at 4.6 cm.  Really no significant change dating back to 2014.  Needs continued semiannual follow-up.  Hypertension-blood pressure well controlled on current regimen.  Stage Ia adenocarcinoma with lipidic spread-no evidence of recurrence 6 years post resection.  Plan: Return in 6 months  with CT angiogram of chest  Melrose Nakayama, MD [facility] [phone]

## 2019-02-08 ENCOUNTER — Other Ambulatory Visit: Payer: Self-pay | Admitting: Cardiovascular Disease

## 2019-02-08 MED ORDER — VERAPAMIL HCL ER 120 MG PO TBCR: 120.0000 mg | EXTENDED_RELEASE_TABLET | Freq: Every day | ORAL | 0 refills | Status: DC

## 2019-02-08 NOTE — Telephone Encounter (Signed)
New Message     *STAT* If patient is at the pharmacy, call can be transferred to refill team.   1. Which medications need to be refilled? (please list name of each medication and dose if known) Verapamil  2. Which pharmacy/location (including street and city if local pharmacy) is medication to be sent to? [facility]  3. Do they need a 30 day or 90 day supply? 90 day supply

## 2019-02-08 NOTE — Telephone Encounter (Signed)
Verapamil 120 mg daily refilled.

## 2019-02-09 ENCOUNTER — Other Ambulatory Visit: Payer: Self-pay | Admitting: Cardiovascular Disease

## 2019-03-27 ENCOUNTER — Telehealth: Payer: Self-pay | Admitting: Cardiovascular Disease

## 2019-03-27 NOTE — Telephone Encounter (Signed)
Pt had bouts of an irregular heart beat this weekend, not currently. Saturday 8/29, pulse was 150 bpm so pt began taking metoprolol and started with 25 mg, later took 12.5 mg and then repeated once more with 12.5 mg over a 4-5 hr range. That evening her HR reduced to low 40's to 65.  Today she is feeling 'ok' but bp today 99/52 p 53, 108/65 p41 Pt was told to increase fluids, pt admitted she was dehydrated, pt was told to hold losartan this evening if bp systolic was below 753. Pt was told that a message will be sent to Dr/RN to update on her weekend event, pt will call back if has a further event.

## 2019-03-27 NOTE — Telephone Encounter (Signed)
Acknowledged.

## 2019-03-27 NOTE — Telephone Encounter (Signed)
STAT if HR is under 50 or over 120 (normal HR is 60-100 beats per minute)  1) What is your heart rate?  The highest have been 160 and the lowest was 29- This between Saturday and Sunday 2)  3) og of your heart rate readings (document readings)? No 4)  5) u have any other symptoms? Lightheaded  And uncomfortable in breast area when she had an episodet

## 2019-03-29 ENCOUNTER — Other Ambulatory Visit: Payer: Self-pay | Admitting: Cardiovascular Disease

## 2019-05-04 ENCOUNTER — Encounter: Payer: Self-pay | Admitting: Gynecology

## 2019-06-26 ENCOUNTER — Other Ambulatory Visit: Payer: Self-pay | Admitting: Cardiovascular Disease

## 2019-07-12 ENCOUNTER — Other Ambulatory Visit: Payer: Self-pay | Admitting: *Deleted

## 2019-07-12 DIAGNOSIS — I712 Thoracic aortic aneurysm, without rupture: Secondary | ICD-10-CM

## 2019-07-12 DIAGNOSIS — I7121 Aneurysm of the ascending aorta, without rupture: Secondary | ICD-10-CM

## 2019-08-08 ENCOUNTER — Ambulatory Visit: Payer: Medicare Other | Admitting: Thoracic Surgery (Cardiothoracic Vascular Surgery)

## 2019-08-10 ENCOUNTER — Other Ambulatory Visit: Payer: Self-pay | Admitting: *Deleted

## 2019-08-10 DIAGNOSIS — I7121 Aneurysm of the ascending aorta, without rupture: Secondary | ICD-10-CM

## 2019-08-10 DIAGNOSIS — I712 Thoracic aortic aneurysm, without rupture: Secondary | ICD-10-CM

## 2019-08-11 ENCOUNTER — Other Ambulatory Visit: Payer: Self-pay | Admitting: Thoracic Surgery (Cardiothoracic Vascular Surgery)

## 2019-08-14 ENCOUNTER — Other Ambulatory Visit: Payer: Self-pay | Admitting: Cardiovascular Disease

## 2019-08-14 NOTE — Telephone Encounter (Signed)
New message   *STAT* If patient is at the pharmacy, call can be transferred to refill team.   1. Which medications need to be refilled? (please list name of each medication and dose if known) verapamil (CALAN-SR) 120 MG CR tablet  2. Which pharmacy/location (including street and city if local pharmacy) is medication to be sent to?[id number] - [place], [place] [address]  3. Do they need a 30 day or 90 day supply? Patient states that she needs 20 pills sent to pharmacy until her mail order arrives in the mail.

## 2019-08-15 ENCOUNTER — Other Ambulatory Visit: Payer: Medicare Other

## 2019-08-15 ENCOUNTER — Ambulatory Visit: Payer: Medicare PPO | Admitting: Thoracic Surgery (Cardiothoracic Vascular Surgery)

## 2019-08-16 ENCOUNTER — Other Ambulatory Visit: Payer: Self-pay | Admitting: *Deleted

## 2019-08-16 MED ORDER — VERAPAMIL HCL ER 120 MG PO TBCR: 120.0000 mg | EXTENDED_RELEASE_TABLET | Freq: Every day | ORAL | 0 refills | Status: DC

## 2019-08-16 MED ORDER — VERAPAMIL HCL ER 120 MG PO TBCR: 120.0000 mg | EXTENDED_RELEASE_TABLET | Freq: Every day | ORAL | 1 refills | Status: DC

## 2019-08-19 ENCOUNTER — Ambulatory Visit: Payer: Medicare Other

## 2019-08-20 ENCOUNTER — Ambulatory Visit: Payer: Medicare PPO | Attending: Internal Medicine

## 2019-08-20 DIAGNOSIS — Z23 Encounter for immunization: Secondary | ICD-10-CM | POA: Insufficient documentation

## 2019-08-20 NOTE — Progress Notes (Signed)
   Covid-19 Vaccination Clinic  Name:  Haley [name]    MRN: [id number] DOB: 18-Mar-1934  08/20/2019  [name] was observed post Covid-19 immunization for 15 minutes without incidence. She was provided with Vaccine Information Sheet and instruction to access the V-Safe system.   [name] was instructed to call 911 with any severe reactions post vaccine: Marland Kitchen Difficulty breathing  . Swelling of your face and throat  . A fast heartbeat  . A bad rash all over your body  . Dizziness and weakness    Immunizations Administered    Name Date Dose VIS Date Route   Pfizer COVID-19 Vaccine 08/20/2019  1:26 PM 0.3 mL 07/07/2019 Intramuscular   Manufacturer: [place]   Lot: [id number]   [place]: [id number]

## 2019-08-21 ENCOUNTER — Other Ambulatory Visit: Payer: Self-pay

## 2019-08-23 ENCOUNTER — Ambulatory Visit: Payer: Medicare PPO | Attending: Internal Medicine

## 2019-08-23 DIAGNOSIS — Z20822 Contact with and (suspected) exposure to covid-19: Secondary | ICD-10-CM

## 2019-08-24 ENCOUNTER — Other Ambulatory Visit: Payer: Medicare PPO

## 2019-08-24 LAB — NOVEL CORONAVIRUS, NAA: SARS-CoV-2, NAA: NOT DETECTED

## 2019-08-29 ENCOUNTER — Ambulatory Visit: Payer: Medicare PPO | Admitting: Thoracic Surgery (Cardiothoracic Vascular Surgery)

## 2019-09-01 ENCOUNTER — Ambulatory Visit: Payer: Medicare Other | Admitting: Cardiovascular Disease

## 2019-09-11 ENCOUNTER — Ambulatory Visit: Payer: Medicare PPO | Attending: Internal Medicine

## 2019-09-11 DIAGNOSIS — Z23 Encounter for immunization: Secondary | ICD-10-CM | POA: Insufficient documentation

## 2019-09-11 NOTE — Progress Notes (Signed)
   Covid-19 Vaccination Clinic  Name:  Haley [name]    MRN: [id number] DOB: [birth year]  09/11/2019  [name] was observed post Covid-19 immunization for 15 minutes without incidence. She was provided with Vaccine Information Sheet and instruction to access the V-Safe system.   [name] was instructed to call 911 with any severe reactions post vaccine: Marland Kitchen Difficulty breathing  . Swelling of your face and throat  . A fast heartbeat  . A bad rash all over your body  . Dizziness and weakness    Immunizations Administered    Name Date Dose VIS Date Route   Pfizer COVID-19 Vaccine 09/11/2019  8:48 AM 0.3 mL 07/07/2019 Intramuscular   Manufacturer: [place]   Lot: [id number]   [place]: [id number]

## 2019-09-12 ENCOUNTER — Ambulatory Visit: Payer: Medicare PPO | Admitting: Thoracic Surgery (Cardiothoracic Vascular Surgery)

## 2019-09-12 ENCOUNTER — Encounter: Payer: Self-pay | Admitting: Thoracic Surgery (Cardiothoracic Vascular Surgery)

## 2019-09-12 ENCOUNTER — Other Ambulatory Visit: Payer: Self-pay

## 2019-09-12 ENCOUNTER — Ambulatory Visit
Admission: RE | Admit: 2019-09-12 | Discharge: 2019-09-12 | Disposition: A | Discharge status: Home / Self Care | Payer: Medicare PPO | Source: Ambulatory Visit | Attending: Thoracic Surgery (Cardiothoracic Vascular Surgery) | Admitting: Thoracic Surgery (Cardiothoracic Vascular Surgery)

## 2019-09-12 VITALS — BP 127/76 | HR 63 | Temp 97.2°F | Resp 16 | Ht 66.0 in | Wt 138.0 lb

## 2019-09-12 DIAGNOSIS — C3412 Malignant neoplasm of upper lobe, left bronchus or lung: Secondary | ICD-10-CM

## 2019-09-12 DIAGNOSIS — Z85118 Personal history of other malignant neoplasm of bronchus and lung: Secondary | ICD-10-CM | POA: Diagnosis not present

## 2019-09-12 DIAGNOSIS — I712 Thoracic aortic aneurysm, without rupture: Secondary | ICD-10-CM | POA: Diagnosis not present

## 2019-09-12 DIAGNOSIS — I7121 Aneurysm of the ascending aorta, without rupture: Secondary | ICD-10-CM

## 2019-09-12 MED ORDER — IOPAMIDOL (ISOVUE-370) INJECTION 76%
75.0000 mL | Freq: Once | INTRAVENOUS | Status: AC | PRN
  Administered 2019-09-12: 75 mL via INTRAVENOUS

## 2019-09-12 NOTE — Progress Notes (Signed)
[address]       [place] [address]             [phone]    HPI: Haley [name] returns for a scheduled follow-up visit  Haley [name] is an 86 year old woman with a history of lung cancer, hypertension, hyperlipidemia, supraventricular tachycardia, breast cancer, skin cancer, chronic pancreatitis, irritable bowel syndrome, reflux, and a 4.6 cm ascending aortic aneurysm.  I did a wedge resection on her for a stage Ia adenocarcinoma in 2014.  She was found to have an ascending aneurysm at that time.  It measured 4.5 cm.  We have been following her since then.  I last saw her in the office in July 2020, and the aneurysm was stable at 4.6 cm.  In the interim since her last visit she recently had bronchitis.  She complained of a persistent productive cough, fevers, chills, myalgias and arthralgias.  She was treated with antibiotics and ultimately her symptoms resolved. Past Medical History:  Diagnosis Date  . Arthritis    knees and left hip  . Atrophic gastritis without mention of hemorrhage   . Breast cancer ([place])   . Bronchitis, not specified as acute or chronic   . Cancer [facility])    Lung cancer  . Cardiac dysrhythmia, unspecified   . Cataract   . Chronic pancreatitis ([place])   . Complication of anesthesia   . Cough   . Esophageal reflux   . Family history of malignant neoplasm of gastrointestinal tract   . Fatty liver   . Flatulence, eructation, and gas pain   . Gastric erosions    Haley [name]  . Hiatal hernia   . Hoarseness    since virus 2 months agp  . Irritable bowel syndrome   . Irritable bowel syndrome   . LGSIL (low grade squamous intraepithelial dysplasia) 11/15/2014   with biopsy of  benign vaginal cyst  . Malignant neoplasm of breast (female), unspecified site    Skin Cancer legs, face, shoulder, some basal some squamous  . Other chest pain   . Personal history of radiation therapy   . Pneumonia, organism unspecified(486)   . PONV  (postoperative nausea and vomiting)    with hemorrhoid surgery years ago, no n/v with breast surgery  . Pure hypercholesterolemia   . Shortness of breath    with Exertion  . Sorethroat    since virus 2 months ago  . Symptomatic menopausal or female climacteric states   . Thoracic aortic aneurysm without rupture ([place]) 08/29/12   08/29/12 discovered on PET/CT  . Tubular adenoma of colon   . Unspecified essential hypertension   . Urinary tract infection, site not specified     Current Outpatient Medications  Medication Sig Dispense Refill  . atorvastatin (LIPITOR) 40 MG tablet TAKE 1 TABLET BY MOUTH  DAILY 90 tablet 3  . b complex vitamins tablet Take 1 tablet by mouth daily.     Marland Kitchen co-enzyme Q-10 30 MG capsule Take 300 mg by mouth daily.    Marland Kitchen losartan (COZAAR) 50 MG tablet Take 1 tablet (50 mg total) by mouth every morning AND 0.5 tablets (25 mg total) every evening. 135 tablet 3  . metoprolol succinate (TOPROL-XL) 25 MG 24 hr tablet Take 0.5 tablets (12.5 mg total) by mouth daily as needed. 15 tablet 3  . Omega-3 1000 MG CAPS Take by mouth.    . Probiotic Product (PROBIOTIC DAILY PO) Take by mouth.    . verapamil (  CALAN-SR) 120 MG CR tablet Take 1 tablet (120 mg total) by mouth daily. 90 tablet 1  . Vitamin D, Cholecalciferol, 50 MCG (2000 UT) CAPS Take 1 capsule by mouth daily.     No current facility-administered medications for this visit.    Physical Exam BP 127/76 (BP Location: Right Arm, Patient Position: Sitting, Cuff Size: Normal)   Pulse 63   Temp (!) 97.2 F (36.2 C)   Resp 16   Ht 5\' 6"  (1.676 m)   Wt 138 lb (62.6 kg)   SpO2 98% Comment: RA  BMI 22.27 kg/m  86 year old woman in no acute distress Alert and oriented x3 with no focal deficits Lungs clear with equal breath sounds bilaterally Cardiac regular rate and rhythm normal S1 and S2 No peripheral edema  Diagnostic Tests: CT ANGIOGRAPHY CHEST WITH CONTRAST  TECHNIQUE: Multidetector CT imaging of the chest  was performed using the standard protocol during bolus administration of intravenous contrast. Multiplanar CT image reconstructions and MIPs were obtained to evaluate the vascular anatomy.  CONTRAST:  29mL ISOVUE-370 IOPAMIDOL (ISOVUE-370) INJECTION 76%  COMPARISON:  CT angiogram chest 12/22/2018  FINDINGS: Cardiovascular:  Heart size at the upper limits of normal. Unchanged from prior examination 12/22/2018, the maximal ascending thoracic aortic diameter measures 4.6 cm. The proximal aortic arch measures 3.5 cm in diameter, stable. The proximal descending thoracic aorta measures 2.5 cm in diameter, stable. Redemonstrated mild atherosclerotic plaque within the descending thoracic aorta. No evidence of dissection.  MEDIASTINUM/NODES:  No enlarged mediastinal, hilar or axillary lymph nodes.  The visualized thyroid gland is unremarkable.  LUNGS/PLEURA: Redemonstrated scarring within the right lung apex and posterior left upper lobe. No confluent airspace consolidation. New from prior examination, there is mild scattered tree-in-bud nodularity within the right lower lobe. The central airways are grossly patent.  UPPER ABDOMEN: No acute abnormality.  MUSCULOSKELETAL: No acute bony abnormality. Thoracic spondylosis.  Review of the MIP images confirms the above findings.  IMPRESSION: Unchanged ascending thoracic aortic aneurysm measuring up to 4.6 cm in diameter. Ascending thoracic aortic aneurysm. Recommend semi-annual imaging followup by CTA or MRA and referral to cardiothoracic surgery if not already obtained. This recommendation follows 2010 ACCF/AHA/AATS/ACR/ASA/SCA/SCAI/SIR/STS/SVM Guidelines for the Diagnosis and Management of Patients With Thoracic Aortic Disease. Circulation. 2010; 121: T465-K812. Aortic aneurysm NOS (ICD10-I71.9).  Mild scattered tree in bud nodularity within the right lower lobe, new as compared to prior examination. Findings may  reflect sequela of atypical infection.   Electronically Signed   By: Kellie Simmering DO   On: 09/12/2019 12:57 I personally reviewed the CT images and concur with the findings noted above  Impression: Haley [name] is an 86 year old woman with a history of lung cancer, hypertension, hyperlipidemia, supraventricular tachycardia, breast cancer, skin cancer, chronic pancreatitis, irritable bowel syndrome, reflux, and a 4.6 cm ascending aortic aneurysm.  Ascending aneurysm-stable at 4.6 cm.  Needs continued semiannual follow-up.  Hypertension-blood pressure well controlled on current regimen.  Stage Ia adenocarcinoma with lepidic spread-now over 5 years out with no evidence of recurrence.  Tree-in-bud opacity right lower lobe-likely due to recent respiratory infection.  Plan: Return in 6 months with CT angio of chest  Melrose Nakayama, MD [facility] [phone]

## 2019-10-30 DIAGNOSIS — I10 Essential (primary) hypertension: Secondary | ICD-10-CM | POA: Diagnosis not present

## 2019-10-30 DIAGNOSIS — Z0000 Encounter for general adult medical examination without abnormal findings: Secondary | ICD-10-CM | POA: Diagnosis not present

## 2019-10-30 DIAGNOSIS — E7800 Pure hypercholesterolemia, unspecified: Secondary | ICD-10-CM | POA: Diagnosis not present

## 2019-10-30 DIAGNOSIS — I714 Abdominal aortic aneurysm, without rupture: Secondary | ICD-10-CM | POA: Diagnosis not present

## 2019-11-10 ENCOUNTER — Ambulatory Visit: Payer: Medicare PPO | Admitting: Cardiovascular Disease

## 2019-11-10 ENCOUNTER — Encounter: Payer: Self-pay | Admitting: Cardiovascular Disease

## 2019-11-10 ENCOUNTER — Other Ambulatory Visit: Payer: Self-pay

## 2019-11-10 VITALS — BP 118/78 | HR 49 | Ht 66.0 in | Wt 138.2 lb

## 2019-11-10 DIAGNOSIS — I471 Supraventricular tachycardia: Secondary | ICD-10-CM | POA: Diagnosis not present

## 2019-11-10 DIAGNOSIS — I10 Essential (primary) hypertension: Secondary | ICD-10-CM

## 2019-11-10 DIAGNOSIS — I712 Thoracic aortic aneurysm, without rupture, unspecified: Secondary | ICD-10-CM

## 2019-11-10 DIAGNOSIS — I498 Other specified cardiac arrhythmias: Secondary | ICD-10-CM

## 2019-11-10 DIAGNOSIS — E785 Hyperlipidemia, unspecified: Secondary | ICD-10-CM

## 2019-11-10 NOTE — Patient Instructions (Signed)

## 2019-11-10 NOTE — Progress Notes (Signed)
Patient ID: Haley [name], female   DOB: 1933-08-15, 86 y.o.   MRN: [id number]     HPI: Haley [name] is a 86 y.o. female who presents to the office today for a 10 month cardiology evaluation.  Haley [name]  has a history of superventricular tachycardia in the past has been treated with verapamil as well as beta blocker therapy. Recently, she has done well and now only takes a beta blocker on an as needed basis and admits to only rarely taking this. She has a remote history of breast cancer. She has a history of hiatal hernia with GERD, hyperlipidemia, and also evidence for descending thoracic aorta that is increased at 4.5 cm on a CT scan suggested fusiform aneurysmal dilatation. On the initial CT in 2011 she was found to have a right upper lung nodule. 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 [name]. She has had subsequent CT scan is for followup.  Her last CT angiogram of her chest was on 10/23/2014 which demonstrated a stable descending thoracic aortic aneurysm measuring 4.8 cm maximally.  Semiannual imaging follow-up I, CTA or MRI was recommended. She denies recent chest pain. She denies tachycardia palpitations. She denies PND or orthopnea. She denies bleeding.  Laboratory by her primary physician in February 2016: Her hemoglobin and hematocrit were stable at 12.8 and 39.4.  Chemistry profile was normal.  Chest normal renal function.  Lipids are excellent with a total cholesterol 165, triglycerides 131, HDL 67, LDL 72.  She is followed by [name] for her ascending thoracic aortic aneurysm.  I saw her in May 2018  and she  remained stable and has been without recurrent episodes of SVT.  She  saw [name] on Dec 21, 2017 and her most recent CT showed a stable a sending thoracic aneurysm at 4.7 cm in greatest diameter without evidence for dissection.  There were stable  postoperative changes after prior left lung wedge resection.  There was a stable 7 mm dense calcification in the splenic hilum likely representing a small densely calcified splenic artery aneurysm.  She continues to be on six-month follow-up CT imaging per [name].    She wore an event monitor ordered by [name] from April 10 through Dec 02, 2017.  Her predominant rhythm was sinus rhythm.  There were isolated PVCs and PACs.  There were no episodes of ventricular couplets or episodes of atrial fibrillation.  I saw her in August 2019 at which time she denied any chest pain and was on she denies any episodes of chest pain.  She was been on atorvastatin 20 mg for hyperlipidemia.  In February 2019 LDL cholesterol was 86.  She is on metoprolol 12.5 mg and verapamil 120 mg for hypertension as well as her SVT.  When I saw her, I recommended further titration of atorvastatin to 40 mg.  Over the past 3 months, she has continued to do well.  She underwent follow-up laboratory 2 weeks ago and LDL cholesterol was now at 55.    I saw her in the office in 2019 at which time she continued to do well. She underwent follow-up laboratory 2 weeks ago and LDL cholesterol was now at 55. She underwent a repeat CT Angio of her chest on December 3 and saw [name] in follow-up. At that time she had a stable ascending thoracic aneurysm measuring 4.7  cm.  She recently underwent a follow-up 77-monthevaluation with CT imaging on Dec 22, 2018.   Anterior ascending thoracic aneurysm remains stable measuring 4.6 cm maximally.  She also had ascending aortic atherosclerosis.  There was no acute cardiopulmonary disease.  She is scheduled to see Dr. HRoxan Hockeyin follow-up.  She was last evaluated by me in a telemedicine visit in June 2020.  She was continuing to feel well.  Her blood pressures have been running stable in the 118/70-120 2/77 range.  She denies chest pain palpitations shortness of breath.  She has  GERD.  Recent laboratory by her primary physician [name] March 4 had shown excellent lipid studies with total cholesterol 145, HDL 74, triglycerides 91 and LDL 53.  Total bilirubin was 1.5 and ALT and AST were normal.    Since her last evaluation, she has continued to feel well.  Earlier this year she had her first PH[place]au[place]accination and developed chills and fever following the initial injection.  She apparently was treated with antibiotics by her primary physician.  She underwent a follow-up CT in February 2021.  Her ascending thoracic aortic aneurysm was unchanged and measured up to 4.6 cm in diameter.  He was seen by Dr. HRoxan Hockeyin follow-up evaluation.  Presently, she has continued to feel well.  She denies chest pain PND orthopnea.  She did not have a reaction to her second vaccine.  She has been on atorvastatin 40 mg daily for hyperlipidemia, and losartan 50 mg in the morning and 25 mg in the evening well as verapamil 120 mg daily both for hypertension and palpitations.  Has not had any further episodes of SVT.  She underwent repeat laboratory by [name] October 30, 2019.  Lipid studies were excellent with total cholesterol 146, HDL 68 and LDL 63.  Chemistries were normal although total bilirubin was minimally increased at 1.5.  She presents for evaluation  Past Medical History:  Diagnosis Date  . Arthritis    knees and left hip  . Atrophic gastritis without mention of hemorrhage   . [place]   . Bronchitis, not specified as acute or chronic   . Cancer ([facility]    Lung cancer  . Cardiac dysrhythmia, unspecified   . Cataract   . Chronic pancreatitis ([place]   . Complication of anesthesia   . Cough   . Esophageal reflux   . Family history of malignant neoplasm of gastrointestinal tract   . Fatty liver   . Flatulence, eructation, and gas pain   . Gastric erosions    CLysbeth Galas . Hiatal hernia   . Hoarseness    since virus 2 months agp  . Irritable  bowel syndrome   . Irritable bowel syndrome   . LGSIL (low grade squamous intraepithelial dysplasia) 11/15/2014   with biopsy of  benign vaginal cyst  . Malignant neoplasm of breast (female), unspecified site    Skin Cancer legs, face, shoulder, some basal some squamous  . Other chest pain   . Personal history of radiation therapy   . Pneumonia, organism unspecified(486)   . PONV (postoperative nausea and vomiting)    with hemorrhoid surgery years ago, no n/v with breast surgery  . Pure hypercholesterolemia   . Shortness of breath    with Exertion  . Sorethroat    since virus 2 months ago  . Symptomatic menopausal or female climacteric states   . Thoracic [place] aneurysm without rupture ([place] 08/29/12  08/29/12 discovered on PET/CT  . Tubular adenoma of colon   . Unspecified essential hypertension   . Urinary tract infection, site not specified     Past Surgical History:  Procedure Laterality Date  . BREAST LUMPECTOMY    . BREAST SURGERY    . CATARACT EXTRACTION     Bilateral  . COLONOSCOPY    . EYE SURGERY    . HEMORROIDECTOMY    . SKIN CANCER EXCISION    . VIDEO ASSISTED THORACOSCOPY (VATS)/WEDGE RESECTION Left 09/19/2012   Procedure: LEFT VIDEO ASSISTED THORACOSCOPY (VATS)/LEFT UPPER LOBE WEDGE RESECTION & NODE DISSECTION;  Surgeon: Melrose Nakayama, MD;  Location: [facility];  Service: Thoracic;  Laterality: Left;    No Known Allergies  Current Outpatient Medications  Medication Sig Dispense Refill  . atorvastatin (LIPITOR) 40 MG tablet TAKE 1 TABLET BY MOUTH  DAILY 90 tablet 3  . b complex vitamins tablet Take 1 tablet by mouth daily.     Marland Kitchen co-enzyme Q-10 30 MG capsule Take 300 mg by mouth daily.    Marland Kitchen losartan (COZAAR) 50 MG tablet Take 1 tablet (50 mg total) by mouth every morning AND 0.5 tablets (25 mg total) every evening. 135 tablet 3  . metoprolol succinate (TOPROL-XL) 25 MG 24 hr tablet Take 0.5 tablets (12.5 mg total) by mouth daily as needed. 15 tablet 3  .  Omega-3 1000 MG CAPS Take by mouth.    . Probiotic Product (PROBIOTIC DAILY PO) Take by mouth.    . verapamil (CALAN-SR) 120 MG CR tablet Take 1 tablet (120 mg total) by mouth daily. 90 tablet 1  . Vitamin D, Cholecalciferol, 50 MCG (2000 UT) CAPS Take 1 capsule by mouth daily.     No current facility-administered medications for this visit.    Social History   Socioeconomic History  . Marital status: Married    Spouse name: Not on file  . Number of children: 2  . Years of education: Not on file  . Highest education level: Not on file  Occupational History  . Occupation: Retired    Comment: Tourist information centre manager  Tobacco Use  . Smoking status: Never Smoker  . Smokeless tobacco: Never Used  Substance and Sexual Activity  . Alcohol use: No    Alcohol/week: 0.0 standard drinks  . Drug use: No  . Sexual activity: Never    Comment: 1st intercourse 33 yo-1 partner  Other Topics Concern  . Not on file  Social History Narrative   Married   Retired   Careers adviser         Social Determinants of Health   Financial Resource Strain:   . Difficulty of Paying Living Expenses:   Food Insecurity:   . Worried About Charity fundraiser in the Last Year:   . Arboriculturist in the Last Year:   Transportation Needs:   . Film/video editor (Medical):   Marland Kitchen Lack of Transportation (Non-Medical):   Physical Activity:   . Days of Exercise per Week:   . Minutes of Exercise per Session:   Stress:   . Feeling of Stress :   Social Connections:   . Frequency of Communication with Friends and Family:   . Frequency of Social Gatherings with Friends and Family:   . Attends Religious Services:   . Active Member of Clubs or Organizations:   . Attends Archivist Meetings:   Marland Kitchen Marital Status:   Intimate Partner Violence:   .  Fear of Current or Ex-Partner:   . Emotionally Abused:   Marland Kitchen Physically Abused:   . Sexually Abused:     Family History  Problem Relation Age of Onset    . Colon cancer Father 93  . Cancer Father   . Stroke Father   . Heart disease Mother   . Heart disease Brother   . Cancer Brother        mass behind lung-died age 34  . Multiple myeloma Brother 61       died age 69  . Diabetes Paternal Aunt     ROS General: Negative; No fevers, chills, or night sweats;  HEENT: Negative; No changes in vision or hearing, sinus congestion, difficulty swallowing Pulmonary: Negative; No cough, wheezing, shortness of breath, hemoptysis Cardiovascular: Negative; No chest pain, presyncope, syncope, palpitations; no recurrent SVT GI: Negative; No nausea, vomiting, diarrhea, or abdominal pain GU: Negative; No dysuria, hematuria, or difficulty voiding Musculoskeletal: Negative; no myalgias, joint pain, or weakness Hematologic/Oncology: Negative; no easy bruising, bleeding Endocrine: Negative; no heat/cold intolerance; no diabetes Neuro: Negative; no changes in balance, headaches Skin: Negative; No rashes or skin lesions Psychiatric: Negative; No behavioral problems, depression Sleep: Negative; No snoring, daytime sleepiness, hypersomnolence, bruxism, restless legs, hypnogognic hallucinations, no cataplexy Other comprehensive 14 point system review is negative.   PE BP 118/78   Pulse (!) 49   Ht '5\' 6"'  (1.676 m)   Wt 138 lb 3.2 oz (62.7 kg)   BMI 22.31 kg/m    Pete blood pressure by me was 128/76  Wt Readings from Last 3 Encounters:  11/10/19 138 lb 3.2 oz (62.7 kg)  09/12/19 138 lb (62.6 kg)  02/07/19 138 lb 3.2 oz (62.7 kg)    BP 118/78   Pulse (!) 49   Ht '5\' 6"'  (1.676 m)   Wt 138 lb 3.2 oz (62.7 kg)   BMI 22.31 kg/m  General: Alert, oriented, no distress.  Skin: normal turgor, no rashes, warm and dry HEENT: Normocephalic, atraumatic. Pupils equal round and reactive to light; sclera anicteric; extraocular muscles intact;  Nose without nasal septal hypertrophy Mouth/Parynx benign; Mallinpatti scale 2 Neck: No JVD, no carotid bruits;  normal carotid upstroke Lungs: clear to ausculatation and percussion; no wheezing or rales Chest wall: without tenderness to palpitation Heart: PMI not displaced, bradycardic with mild irregularity, s1 s2 normal, 1/6 systolic murmur, no diastolic murmur, no rubs, gallops, thrills, or heaves Abdomen: soft, nontender; no hepatosplenomehaly, BS+; abdominal aorta nontender and not dilated by palpation. Back: no CVA tenderness Pulses 2+ Musculoskeletal: full range of motion, normal strength, no joint deformities Extremities: no clubbing cyanosis or edema, Homan's sign negative  Neurologic: grossly nonfocal; Cranial nerves grossly wnl Psychologic: Normal mood and affect   ECG (independently read by me): Sinus bradycardia at 49 bpm with sinus arrhythmia and first-degree AV block with a.  Of 02 16 ms.  Poor anterior R wave progression.  November 2019  ECG (independently read by me): Sinus bradycardia 56 bpm.  First-degree AV block.  Able to 60 ms.  No ectopy.  No significant ST changes.  August 2019 ECG (independently read by me): Sinus bradycardia 53 bpm.  First-degree AV block with a PR interval of 216 ms.  Poor R wave progression V1 through V3.  May 2018 ECG (independently read by me): Sinus bradycardia 56 bpm.  Mild sinus arrhythmia.  Poor R-wave progression V1 V2 2.  Normal intervals.  April 2016 ECG (independently read by me): Sinus bradycardia at 51 bpm.  Poor progression V1 through V3.  QTc interval 420 ms.  September 2014 ECG: Sinus rhythm 58 beats per minute. Intervals normal.  LABS: I personally reviewed  laboratory done on 09/10/2016 by Harlene Ramus, M.D., done at [facility]  at [place] & Units 06/02/2018 09/05/2015 05/20/2015  Glucose 65 - 99 mg/dL 84 86 -  BUN 8 - 27 mg/dL 14 14 -  Creatinine 0.57 - 1.00 mg/dL 0.81 0.80 0.9  BUN/Creat Ratio 12 - 28 17 - -  Sodium 134 - 144 mmol/L 141 140 -  Potassium 3.5 - 5.2 mmol/L 4.9 4.0 -  Chloride 96 - 106 mmol/L  102 106 -  CO2 20 - 29 mmol/L 25 22 -  Calcium 8.7 - 10.3 mg/dL 9.3 8.9 -    Hepatic Function Latest Ref Rng & Units 06/02/2018 09/05/2015 09/11/2014  Total Protein 6.0 - 8.5 g/dL 6.8 6.7 6.6  Albumin 3.5 - 4.7 g/dL 4.5 3.9 3.9  AST 0 - 40 IU/L '27 17 14  ' ALT 0 - 32 IU/L '15 10 9  ' Alk Phosphatase 39 - 117 IU/L 72 59 56  Total Bilirubin 0.0 - 1.2 mg/dL 1.2 1.1 1.1    CBC Latest Ref Rng & Units 06/02/2018 09/05/2015 09/11/2014  WBC 3.4 - 10.8 x10E3/uL 4.6 3.8(L) 4.7  Hemoglobin 11.1 - 15.9 g/dL 12.4 12.2 12.8  Hematocrit 34.0 - 46.6 % 37.1 36.3 39.4  Platelets 150 - 450 x10E3/uL 284 240 294   Lab Results  Component Value Date   TSH 4.002 07/05/2012    BNP No results found for: PROBNP  Lipid Panel     Component Value Date/Time   CHOL 155 06/02/2018 1041   TRIG 84 06/02/2018 1041   HDL 83 06/02/2018 1041   CHOLHDL 1.9 06/02/2018 1041   CHOLHDL 1.9 09/05/2015 0854   VLDL 19 09/05/2015 0854   LDLCALC 55 06/02/2018 1041     RADIOLOGY: No results found.  IMPRESSION:  1. Essential hypertension   2. Paroxysmal SVT (supraventricular tachycardia) ([place])   3. Thoracic aortic aneurysm without rupture ([place])   4. Hyperlipidemia LDL goal <70   5. Sinus arrhythmia     ASSESSMENT AND PLAN: Ms. Kysar is an 86 year old female who continues to do well from a cardiovascular standpoint. She has a history of paroxysmal supraventricular tachycardia and this has continued to be well controlled with combination verapamil 120 mg.  She also was on low-dose metoprolol succinate but due to bradycardia this ultimately was discontinued and she rarely takes on a as needed basis.  Her blood pressure today is controlled on her current additional regimen of losartan 50 mg daily with her verapamil.  She continues to be on atorvastatin for hyperlipidemia and recent laboratory done at [facility] was reviewed with LDL cholesterol excellent at 63.  Transaminases are normal although total bilirubin was  minimally increased at 1.5.  She is unaware of any history of Gilbert's syndrome.  She is bradycardic on exam but is asymptomatic and has sinus arrhythmia most likely affected by respiration.  I reviewed her most recent CT shows a stable thoracic ascending thoracic aneurysm diameter at 4.6 cm, unchanged from her prior May 2020 evaluation.  She continues to be seen by [name] for follow-up.  He is status post left upper lobe wedge resection in 2014 for adenocarcinoma.  She remains active.  She denies chest pain.  As long as she remains stable I will see her in 1 year for reevaluation  or sooner if needed.   Troy Sine, MD, [facility]  11/10/2019 12:51 PM

## 2019-11-26 ENCOUNTER — Other Ambulatory Visit: Payer: Self-pay | Admitting: Cardiovascular Disease

## 2020-01-30 DIAGNOSIS — Z85828 Personal history of other malignant neoplasm of skin: Secondary | ICD-10-CM | POA: Diagnosis not present

## 2020-01-30 DIAGNOSIS — L308 Other specified dermatitis: Secondary | ICD-10-CM | POA: Diagnosis not present

## 2020-01-30 DIAGNOSIS — L570 Actinic keratosis: Secondary | ICD-10-CM | POA: Diagnosis not present

## 2020-02-08 ENCOUNTER — Other Ambulatory Visit: Payer: Self-pay | Admitting: Thoracic Surgery (Cardiothoracic Vascular Surgery)

## 2020-02-08 DIAGNOSIS — I712 Thoracic aortic aneurysm, without rupture, unspecified: Secondary | ICD-10-CM

## 2020-02-16 ENCOUNTER — Other Ambulatory Visit: Payer: Self-pay | Admitting: Cardiovascular Disease

## 2020-03-07 ENCOUNTER — Ambulatory Visit
Admission: RE | Admit: 2020-03-07 | Discharge: 2020-03-07 | Disposition: A | Discharge status: Home / Self Care | Payer: Medicare PPO | Source: Ambulatory Visit | Attending: Thoracic Surgery (Cardiothoracic Vascular Surgery) | Admitting: Thoracic Surgery (Cardiothoracic Vascular Surgery)

## 2020-03-07 DIAGNOSIS — I712 Thoracic aortic aneurysm, without rupture, unspecified: Secondary | ICD-10-CM

## 2020-03-07 DIAGNOSIS — J984 Other disorders of lung: Secondary | ICD-10-CM | POA: Diagnosis not present

## 2020-03-07 DIAGNOSIS — R911 Solitary pulmonary nodule: Secondary | ICD-10-CM | POA: Diagnosis not present

## 2020-03-07 DIAGNOSIS — I700 Atherosclerosis of aorta: Secondary | ICD-10-CM | POA: Diagnosis not present

## 2020-03-07 MED ORDER — IOPAMIDOL (ISOVUE-370) INJECTION 76%
75.0000 mL | Freq: Once | INTRAVENOUS | Status: AC | PRN
  Administered 2020-03-07: 75 mL via INTRAVENOUS

## 2020-03-12 ENCOUNTER — Ambulatory Visit: Payer: Medicare PPO | Admitting: Thoracic Surgery (Cardiothoracic Vascular Surgery)

## 2020-03-19 ENCOUNTER — Ambulatory Visit: Payer: Medicare PPO | Admitting: Thoracic Surgery (Cardiothoracic Vascular Surgery)

## 2020-03-19 ENCOUNTER — Other Ambulatory Visit: Payer: Self-pay

## 2020-03-19 VITALS — BP 128/78 | HR 57 | Temp 97.9°F | Resp 20 | Ht 66.0 in | Wt 134.0 lb

## 2020-03-19 DIAGNOSIS — R911 Solitary pulmonary nodule: Secondary | ICD-10-CM

## 2020-03-19 DIAGNOSIS — I712 Thoracic aortic aneurysm, without rupture: Secondary | ICD-10-CM

## 2020-03-19 DIAGNOSIS — I7121 Aneurysm of the ascending aorta, without rupture: Secondary | ICD-10-CM

## 2020-03-19 NOTE — Progress Notes (Signed)
[address]       [place] [address]             [phone]      HPI: [name] returns for a scheduled follow-up visit  Haley [name] is an 86 year old woman with a history of a stage Ia lung cancer, hypertension, hyperlipidemia, supraventricular tachycardia, breast cancer, skin cancer, chronic pancreatitis, reflux, irritable bowel syndrome, and a 4.6 cm ascending aneurysm.  Back in 2014 she had a groundglass opacity in the left upper lobe.  I did a wedge resection.  It turned out to be a stage Ia adenocarcinoma.  On her scans at that time she was noted to have a 4.5 cm ascending aneurysm and she has been followed since then.  It has remained relatively stable.  In the interim since her last visit she has been feeling well.  She had pneumonia back in February.  She has not had any issues since then.  She occasionally will feel some tachycardia.  She takes metoprolol as needed for that.  She is not having any chest pain, pressure, or tightness.  Past Medical History:  Diagnosis Date  . Arthritis    knees and left hip  . Atrophic gastritis without mention of hemorrhage   . Breast cancer ([place])   . Bronchitis, not specified as acute or chronic   . Cancer [facility])    Lung cancer  . Cardiac dysrhythmia, unspecified   . Cataract   . Chronic pancreatitis ([place])   . Complication of anesthesia   . Cough   . Esophageal reflux   . Family history of malignant neoplasm of gastrointestinal tract   . Fatty liver   . Flatulence, eructation, and gas pain   . Gastric erosions    Haley [name]  . Hiatal hernia   . Hoarseness    since virus 2 months agp  . Irritable bowel syndrome   . Irritable bowel syndrome   . LGSIL (low grade squamous intraepithelial dysplasia) 11/15/2014   with biopsy of  benign vaginal cyst  . Malignant neoplasm of breast (female), unspecified site    Skin Cancer legs, face, shoulder, some basal some squamous  . Other chest pain   . Personal history of  radiation therapy   . Pneumonia, organism unspecified(486)   . PONV (postoperative nausea and vomiting)    with hemorrhoid surgery years ago, no n/v with breast surgery  . Pure hypercholesterolemia   . Shortness of breath    with Exertion  . Sorethroat    since virus 2 months ago  . Symptomatic menopausal or female climacteric states   . Thoracic aortic aneurysm without rupture ([place]) 08/29/12   08/29/12 discovered on PET/CT  . Tubular adenoma of colon   . Unspecified essential hypertension   . Urinary tract infection, site not specified     Current Outpatient Medications  Medication Sig Dispense Refill  . atorvastatin (LIPITOR) 40 MG tablet TAKE 1 TABLET BY MOUTH  DAILY 90 tablet 3  . b complex vitamins tablet Take 1 tablet by mouth daily.     . Biotin 10000 MCG TABS Take 1 tablet by mouth daily.    Marland Kitchen co-enzyme Q-10 30 MG capsule Take 300 mg by mouth daily.    Marland Kitchen losartan (COZAAR) 50 MG tablet TAKE 1 TABLET BY MOUTH EVERY MORNING AND 1/2 TABLET EVERY EVENING. 135 tablet 3  . metoprolol succinate (TOPROL-XL) 25 MG 24 hr tablet Take 0.5 tablets (12.5 mg total) by  mouth daily as needed. 15 tablet 3  . Omega-3 1000 MG CAPS Take by mouth.    . Probiotic Product (PROBIOTIC DAILY PO) Take by mouth.    . verapamil (CALAN-SR) 120 MG CR tablet TAKE 1 TABLET (120 MG TOTAL) BY MOUTH DAILY. 90 tablet 3  . Vitamin D, Cholecalciferol, 50 MCG (2000 UT) CAPS Take 1 capsule by mouth daily.     No current facility-administered medications for this visit.    Physical Exam BP 128/78 (BP Location: Right Arm, Patient Position: Sitting, Cuff Size: Normal)   Pulse (!) 57   Temp 97.9 F (36.6 C) (Temporal)   Resp 20   Ht 5\' 6"  (1.676 m)   Wt 134 lb (60.8 kg)   SpO2 95% Comment: RA  BMI 21.13 kg/m  86 year old woman in no acute distress Alert and oriented x3 with no focal deficits Lungs clear bilaterally Cardiac regular rate and rhythm, normal S1 and S2 No cervical or supraclavicular adenopathy DP  and PT pulses 2+ bilaterally  Diagnostic Tests: CT ANGIOGRAPHY CHEST WITH CONTRAST  TECHNIQUE: Multidetector CT imaging of the chest was performed using the standard protocol during bolus administration of intravenous contrast. Multiplanar CT image reconstructions and MIPs were obtained to evaluate the vascular anatomy.  CONTRAST:  61mL ISOVUE-370 IOPAMIDOL (ISOVUE-370) INJECTION 76%  COMPARISON:  09/12/2019.  FINDINGS: Cardiovascular: The ascending thoracic aorta measures 4.6 cm in maximum diameter, image 73/4. This is unchanged compared with the previous exam. Second sternocostal joint. The anterior arch measures 3.2 cm. Posterior arch measures 2.7 cm. The descending thoracic aorta measures 2.5 cm. At the level of the hiatus the thoracic aorta measures 2.6 cm.  No signs of aortic dissection. Mild aortic atherosclerotic calcifications noted. No significant coronary artery calcifications identified.  The main pulmonary artery appears patent.  Mediastinum/Nodes: No enlarged mediastinal, hilar, or axillary lymph nodes. Thyroid gland, trachea, and esophagus demonstrate no significant findings.  Lungs/Pleura: No pleural effusion, airspace consolidation or atelectasis. Stable appearance of posterior left upper lobe suture line. Scarring is again identified within the medial aspect of the right middle lobe.  Within the right lung apex there is a sub solid irregular nodular density which is best seen on the coronal image 54/9. This measures 2.1 x 2.1 cm and appears similar to the previous exam. Review of more remote imaging shows this has been present since at least 12/22/2016 but appears mildly progressive. No new lung nodules.  Upper Abdomen: No acute abnormality identified within the imaged portions of the upper abdomen.  Musculoskeletal: No chest wall abnormality. No acute or significant osseous findings.  Review of the MIP images confirms the above  findings.  IMPRESSION: 1. Stable appearance of ascending thoracic aortic aneurysm measuring 4.6 cm. Ascending thoracic aortic aneurysm. Recommend semi-annual imaging followup by CTA or MRA and referral to cardiothoracic surgery if not already obtained. This recommendation follows 2010 ACCF/AHA/AATS/ACR/ASA/SCA/SCAI/SIR/STS/SVM Guidelines for the Diagnosis and Management of Patients With Thoracic Aortic Disease. Circulation. 2010; 121: J188-C166. Aortic aneurysm NOS (ICD10-I71.9) 2. Stable appearance of sub solid irregular nodular density within the right lung apex. Review of more remote imaging shows this has been present since at least 12/22/2016 but appears mildly progressive. Recommend attention in this area on follow-up imaging. 3. Aortic atherosclerosis.  Aortic Atherosclerosis (ICD10-I70.0).   Electronically Signed   By: Kerby Moors M.D.   On: 03/07/2020 12:02 I personally reviewed the CT images and concur with the findings noted above.  Impression: Haley [name] is an 86 year old woman with a history  of a stage Ia lung cancer, hypertension, hyperlipidemia, supraventricular tachycardia, breast cancer, skin cancer, chronic pancreatitis, reflux, irritable bowel syndrome, and a 4.6 cm ascending aneurysm.  Ascending aneurysm-stable at 4.6 cm.  Needs continued semiannual follow-up.  She understands importance of blood pressure control.  Hypertension-well-controlled on current regimen.  History stage Ia lung cancer-she is 7 years out from resection of a well differentiated adenocarcinoma.  No evidence of recurrent disease.  Right apical nodular density-no significant change over the past 6 months.  Is been present going back to 2013 and is definitely changed from that.  Maybe some mild progression since 2018.  I do not see indication for intervention at this time.  We will continue to follow as we follow her aneurysm.  We will review in 6 months.  Plan: Return in 6 months  with CT angiogram of chest to follow-up ascending aneurysm and right apical nodular density.  Melrose Nakayama, MD [facility] [phone]

## 2020-03-19 NOTE — Progress Notes (Signed)
PCP is Rankins, Bill Salinas, MD Referring Provider is Everlene Farrier Loura Back, MD  Chief Complaint  Patient presents with  . Thoracic Aortic Aneurysm    f/u with Chest CTA 03/07/20, also has hx of lung CA, VATS/L upper lobe resection 2014    HPI:   Past Medical History:  Diagnosis Date  . Arthritis    knees and left hip  . Atrophic gastritis without mention of hemorrhage   . Breast cancer ([place])   . Bronchitis, not specified as acute or chronic   . Cancer [facility])    Lung cancer  . Cardiac dysrhythmia, unspecified   . Cataract   . Chronic pancreatitis ([place])   . Complication of anesthesia   . Cough   . Esophageal reflux   . Family history of malignant neoplasm of gastrointestinal tract   . Fatty liver   . Flatulence, eructation, and gas pain   . Gastric erosions    [name]  . Hiatal hernia   . Hoarseness    since virus 2 months agp  . Irritable bowel syndrome   . Irritable bowel syndrome   . LGSIL (low grade squamous intraepithelial dysplasia) 11/15/2014   with biopsy of  benign vaginal cyst  . Malignant neoplasm of breast (female), unspecified site    Skin Cancer legs, face, shoulder, some basal some squamous  . Other chest pain   . Personal history of radiation therapy   . Pneumonia, organism unspecified(486)   . PONV (postoperative nausea and vomiting)    with hemorrhoid surgery years ago, no n/v with breast surgery  . Pure hypercholesterolemia   . Shortness of breath    with Exertion  . Sorethroat    since virus 2 months ago  . Symptomatic menopausal or female climacteric states   . Thoracic aortic aneurysm without rupture ([place]) 08/29/12   08/29/12 discovered on PET/CT  . Tubular adenoma of colon   . Unspecified essential hypertension   . Urinary tract infection, site not specified     Past Surgical History:  Procedure Laterality Date  . BREAST LUMPECTOMY    . BREAST SURGERY    . CATARACT EXTRACTION     Bilateral  . COLONOSCOPY    . EYE SURGERY    . HEMORROIDECTOMY     . SKIN CANCER EXCISION    . VIDEO ASSISTED THORACOSCOPY (VATS)/WEDGE RESECTION Left 09/19/2012   Procedure: LEFT VIDEO ASSISTED THORACOSCOPY (VATS)/LEFT UPPER LOBE WEDGE RESECTION & NODE DISSECTION;  Surgeon: Melrose Nakayama, MD;  Location: [facility];  Service: Thoracic;  Laterality: Left;    Family History  Problem Relation Age of Onset  . Colon cancer Father 1  . Cancer Father   . Stroke Father   . Heart disease Mother   . Heart disease Brother   . Cancer Brother        mass behind lung-died age 66  . Multiple myeloma Brother 57       died age 63  . Diabetes Paternal Aunt     Social History Social History   Tobacco Use  . Smoking status: Never Smoker  . Smokeless tobacco: Never Used  Vaping Use  . Vaping Use: Never used  Substance Use Topics  . Alcohol use: No    Alcohol/week: 0.0 standard drinks  . Drug use: No    Current Outpatient Medications  Medication Sig Dispense Refill  . atorvastatin (LIPITOR) 40 MG tablet TAKE 1 TABLET BY MOUTH  DAILY 90 tablet 3  . b complex vitamins tablet  Take 1 tablet by mouth daily.     . Biotin 10000 MCG TABS Take 1 tablet by mouth daily.    Marland Kitchen co-enzyme Q-10 30 MG capsule Take 300 mg by mouth daily.    Marland Kitchen losartan (COZAAR) 50 MG tablet TAKE 1 TABLET BY MOUTH EVERY MORNING AND 1/2 TABLET EVERY EVENING. 135 tablet 3  . metoprolol succinate (TOPROL-XL) 25 MG 24 hr tablet Take 0.5 tablets (12.5 mg total) by mouth daily as needed. 15 tablet 3  . Omega-3 1000 MG CAPS Take by mouth.    . Probiotic Product (PROBIOTIC DAILY PO) Take by mouth.    . verapamil (CALAN-SR) 120 MG CR tablet TAKE 1 TABLET (120 MG TOTAL) BY MOUTH DAILY. 90 tablet 3  . Vitamin D, Cholecalciferol, 50 MCG (2000 UT) CAPS Take 1 capsule by mouth daily.     No current facility-administered medications for this visit.    No Known Allergies  Review of Systems  BP 128/78 (BP Location: Right Arm, Patient Position: Sitting, Cuff Size: Normal)   Pulse (!) 57   Temp 97.9  F (36.6 C) (Temporal)   Resp 20   Ht _0  (1.676 m)   Wt 134 lb (60.8 kg)   SpO2 95% Comment: RA  BMI 21.63 kg/m  Physical Exam   Diagnostic Tests:   Impression:   Plan:   Melrose Nakayama, MD [facility] [phone]

## 2020-03-20 ENCOUNTER — Other Ambulatory Visit: Payer: Self-pay | Admitting: Cardiovascular Disease

## 2020-03-21 NOTE — Telephone Encounter (Signed)
Rx has been sent to the pharmacy electronically. ° °

## 2020-03-26 DIAGNOSIS — D485 Neoplasm of uncertain behavior of skin: Secondary | ICD-10-CM | POA: Diagnosis not present

## 2020-03-26 DIAGNOSIS — D1801 Hemangioma of skin and subcutaneous tissue: Secondary | ICD-10-CM | POA: Diagnosis not present

## 2020-03-26 DIAGNOSIS — D2271 Melanocytic nevi of right lower limb, including hip: Secondary | ICD-10-CM | POA: Diagnosis not present

## 2020-03-26 DIAGNOSIS — L814 Other melanin hyperpigmentation: Secondary | ICD-10-CM | POA: Diagnosis not present

## 2020-03-26 DIAGNOSIS — D2262 Melanocytic nevi of left upper limb, including shoulder: Secondary | ICD-10-CM | POA: Diagnosis not present

## 2020-03-26 DIAGNOSIS — D225 Melanocytic nevi of trunk: Secondary | ICD-10-CM | POA: Diagnosis not present

## 2020-03-26 DIAGNOSIS — Z85828 Personal history of other malignant neoplasm of skin: Secondary | ICD-10-CM | POA: Diagnosis not present

## 2020-04-03 DIAGNOSIS — H40053 Ocular hypertension, bilateral: Secondary | ICD-10-CM | POA: Diagnosis not present

## 2020-08-14 ENCOUNTER — Other Ambulatory Visit: Payer: Self-pay | Admitting: Thoracic Surgery (Cardiothoracic Vascular Surgery)

## 2020-08-14 DIAGNOSIS — I7121 Aneurysm of the ascending aorta, without rupture: Secondary | ICD-10-CM

## 2020-08-14 DIAGNOSIS — I712 Thoracic aortic aneurysm, without rupture, unspecified: Secondary | ICD-10-CM

## 2020-09-10 ENCOUNTER — Other Ambulatory Visit: Payer: Self-pay

## 2020-09-10 ENCOUNTER — Encounter: Payer: Self-pay | Admitting: Thoracic Surgery (Cardiothoracic Vascular Surgery)

## 2020-09-10 ENCOUNTER — Ambulatory Visit
Admission: RE | Admit: 2020-09-10 | Discharge: 2020-09-10 | Disposition: A | Discharge status: Home / Self Care | Payer: Medicare PPO | Source: Ambulatory Visit | Attending: Thoracic Surgery (Cardiothoracic Vascular Surgery) | Admitting: Thoracic Surgery (Cardiothoracic Vascular Surgery)

## 2020-09-10 ENCOUNTER — Ambulatory Visit: Payer: Medicare PPO | Admitting: Thoracic Surgery (Cardiothoracic Vascular Surgery)

## 2020-09-10 VITALS — BP 140/90 | HR 60 | Resp 20 | Ht 60.0 in | Wt 136.0 lb

## 2020-09-10 DIAGNOSIS — I712 Thoracic aortic aneurysm, without rupture, unspecified: Secondary | ICD-10-CM

## 2020-09-10 DIAGNOSIS — Z85118 Personal history of other malignant neoplasm of bronchus and lung: Secondary | ICD-10-CM | POA: Diagnosis not present

## 2020-09-10 DIAGNOSIS — I7121 Aneurysm of the ascending aorta, without rupture: Secondary | ICD-10-CM

## 2020-09-10 DIAGNOSIS — I700 Atherosclerosis of aorta: Secondary | ICD-10-CM | POA: Diagnosis not present

## 2020-09-10 DIAGNOSIS — R911 Solitary pulmonary nodule: Secondary | ICD-10-CM

## 2020-09-10 DIAGNOSIS — J984 Other disorders of lung: Secondary | ICD-10-CM | POA: Diagnosis not present

## 2020-09-10 MED ORDER — IOPAMIDOL (ISOVUE-370) INJECTION 76%
75.0000 mL | Freq: Once | INTRAVENOUS | Status: AC | PRN
  Administered 2020-09-10: 75 mL via INTRAVENOUS

## 2020-09-10 NOTE — Progress Notes (Signed)
[address]       [place] [address]             [phone]    HPI: Haley [name] returns for a scheduled 22-month follow-up regarding her ascending aneurysm and lung nodules  Haley [name] is an 87 year old woman with a past history that includes a stage Ia lung cancer, hypertension, hyperlipidemia, supraventricular tachycardia, breast cancer, skin cancer, chronic pancreatitis, reflux, irritable bowel syndrome, and an ascending aortic aneurysm.  In 2014 I did a wedge resection of a groundglass opacity in the left upper lobe.  That turned out to be a stage Ia adenocarcinoma.  She has been followed since that time.  She had a 4.5 cm ascending aneurysm at that time.  I last saw her in August 2021.  Her aneurysm was stable and her groundglass opacity in the right upper lobe was stable as well.  In the interim since her last visit she has been feeling well.  She takes Cozaar and verapamil on a regular basis.  She only takes Toprol-XL when her heart rate is up.  She has not had any recent issues with that.  Past Medical History:  Diagnosis Date  . Arthritis    knees and left hip  . Atrophic gastritis without mention of hemorrhage   . Breast cancer ([place])   . Bronchitis, not specified as acute or chronic   . Cancer [facility])    Lung cancer  . Cardiac dysrhythmia, unspecified   . Cataract   . Chronic pancreatitis ([place])   . Complication of anesthesia   . Cough   . Esophageal reflux   . Family history of malignant neoplasm of gastrointestinal tract   . Fatty liver   . Flatulence, eructation, and gas pain   . Gastric erosions    Haley [name]  . Hiatal hernia   . Hoarseness    since virus 2 months agp  . Irritable bowel syndrome   . Irritable bowel syndrome   . LGSIL (low grade squamous intraepithelial dysplasia) 11/15/2014   with biopsy of  benign vaginal cyst  . Malignant neoplasm of breast (female), unspecified site    Skin Cancer legs, face, shoulder, some basal some  squamous  . Other chest pain   . Personal history of radiation therapy   . Pneumonia, organism unspecified(486)   . PONV (postoperative nausea and vomiting)    with hemorrhoid surgery years ago, no n/v with breast surgery  . Pure hypercholesterolemia   . Shortness of breath    with Exertion  . Sorethroat    since virus 2 months ago  . Symptomatic menopausal or female climacteric states   . Thoracic aortic aneurysm without rupture ([place]) 08/29/12   08/29/12 discovered on PET/CT  . Tubular adenoma of colon   . Unspecified essential hypertension   . Urinary tract infection, site not specified     Current Outpatient Medications  Medication Sig Dispense Refill  . atorvastatin (LIPITOR) 40 MG tablet TAKE 1 TABLET EVERY DAY 90 tablet 2  . b complex vitamins tablet Take 1 tablet by mouth daily.    . Biotin 10000 MCG TABS Take 1 tablet by mouth daily.    Marland Kitchen co-enzyme Q-10 30 MG capsule Take 300 mg by mouth daily.    Marland Kitchen losartan (COZAAR) 50 MG tablet TAKE 1 TABLET BY MOUTH EVERY MORNING AND 1/2 TABLET EVERY EVENING. 135 tablet 3  . metoprolol succinate (TOPROL-XL) 25 MG 24 hr tablet Take 0.5 tablets (  12.5 mg total) by mouth daily as needed. 15 tablet 3  . Omega-3 1000 MG CAPS Take by mouth.    . Probiotic Product (PROBIOTIC DAILY PO) Take by mouth.    . verapamil (CALAN-SR) 120 MG CR tablet TAKE 1 TABLET (120 MG TOTAL) BY MOUTH DAILY. 90 tablet 3  . Vitamin D, Cholecalciferol, 50 MCG (2000 UT) CAPS Take 1 capsule by mouth daily.     No current facility-administered medications for this visit.    Physical Exam BP 140/90   Pulse 60   Resp 20   Ht 5' (1.524 m)   Wt 136 lb (61.7 kg)   SpO2 98% Comment: RA  BMI 26.3 kg/m  87 year old woman in no acute distress Alert and oriented x3 with no focal deficits Lungs clear with equal breath sounds bilaterally No cervical or supraclavicular adenopathy No carotid bruits Cardiac regular rate and rhythm, normal S1 and S2, no murmur No peripheral  edema  Diagnostic Tests: CT ANGIOGRAPHY CHEST WITH CONTRAST  TECHNIQUE: Multidetector CT imaging of the chest was performed using the standard protocol during bolus administration of intravenous contrast. Multiplanar CT image reconstructions and MIPs were obtained to evaluate the vascular anatomy.  CONTRAST:  62mL ISOVUE-370 IOPAMIDOL (ISOVUE-370) INJECTION 76%  COMPARISON:  March 07, 2020.  FINDINGS: Cardiovascular: Atherosclerosis of thoracic aorta is noted without dissection. Great vessels are widely patent without significant stenosis. Grossly stable 4.7 cm ascending thoracic aortic aneurysm is noted. Transverse aortic arch measures 2.6 cm. Proximal descending thoracic aorta measures 2.8 cm. Normal cardiac size. No pericardial effusion.  Mediastinum/Nodes: No enlarged mediastinal, hilar, or axillary lymph nodes. Thyroid gland, trachea, and esophagus demonstrate no significant findings.  Lungs/Pleura: No pneumothorax or pleural effusion is noted. Stable scarring is seen in the right middle lobe. Stable sub solid irregular nodular density is noted medially and right upper lobe. Stable left upper lobe scarring is noted. Continued presence of cluster of small nodule seen in right lower lobe most consistent with sequela of atypical inflammation.  Upper Abdomen: No acute abnormality.  Musculoskeletal: No chest wall abnormality. No acute or significant osseous findings.  Review of the MIP images confirms the above findings.  IMPRESSION: 1. Grossly stable 4.7 cm ascending thoracic aortic aneurysm. Ascending thoracic aortic aneurysm. Recommend semi-annual imaging followup by CTA or MRA and referral to cardiothoracic surgery if not already obtained. This recommendation follows 2010 ACCF/AHA/AATS/ACR/ASA/SCA/SCAI/SIR/STS/SVM Guidelines for the Diagnosis and Management of Patients With Thoracic Aortic Disease. Circulation. 2010; 121: T517-O160. Aortic aneurysm  NOS (ICD10-I71.9). 2. Stable sub solid irregular nodular density is noted medially and right upper lobe. Attention to this abnormality on follow-up imaging is recommended. 3. Continued presence of cluster of small nodule seen in right lower lobe most consistent with sequela of atypical inflammation. Attention to this abnormality on follow-up imaging is also recommended.  Aortic Atherosclerosis (ICD10-I70.0).   Electronically Signed   By: Marijo Conception M.D.   On: 09/10/2020 11:00 I personally reviewed the CT images and concur with the findings noted above.  Impression: Haley [name] is an 87 year old woman with a past history that includes a stage Ia lung cancer, hypertension, hyperlipidemia, supraventricular tachycardia, breast cancer, skin cancer, chronic pancreatitis, reflux, irritable bowel syndrome, and an ascending aortic aneurysm.  Stage Ia lung cancer left upper lobe-8 years out with no evidence of recurrent disease.  Groundglass opacity right upper lobe-has been present back to 2013.  No recent change over the past year.  We will continue to follow.  Hypertension-blood pressure at  upper end of normal.  She does not check her self on a regular basis at home I recommended that she do so.  Ascending aneurysm-stable to minimally larger with a measured diameter of 4.7 cm.  Blood pressure control is the mainstay of treatment.  Needs continued semiannual follow-up.  Plan: Return in 6 months with CT angio of chest  Melrose Nakayama, MD [facility] [phone]

## 2020-09-17 ENCOUNTER — Ambulatory Visit: Payer: Medicare PPO | Admitting: Thoracic Surgery (Cardiothoracic Vascular Surgery)

## 2020-10-15 DIAGNOSIS — M545 Low back pain, unspecified: Secondary | ICD-10-CM | POA: Diagnosis not present

## 2020-10-15 DIAGNOSIS — M25551 Pain in right hip: Secondary | ICD-10-CM | POA: Diagnosis not present

## 2020-10-15 DIAGNOSIS — M25552 Pain in left hip: Secondary | ICD-10-CM | POA: Diagnosis not present

## 2020-11-05 DIAGNOSIS — I471 Supraventricular tachycardia: Secondary | ICD-10-CM | POA: Diagnosis not present

## 2020-11-05 DIAGNOSIS — Z85118 Personal history of other malignant neoplasm of bronchus and lung: Secondary | ICD-10-CM | POA: Diagnosis not present

## 2020-11-05 DIAGNOSIS — I10 Essential (primary) hypertension: Secondary | ICD-10-CM | POA: Diagnosis not present

## 2020-11-05 DIAGNOSIS — I712 Thoracic aortic aneurysm, without rupture: Secondary | ICD-10-CM | POA: Diagnosis not present

## 2020-11-05 DIAGNOSIS — Z0000 Encounter for general adult medical examination without abnormal findings: Secondary | ICD-10-CM | POA: Diagnosis not present

## 2020-11-05 DIAGNOSIS — Z853 Personal history of malignant neoplasm of breast: Secondary | ICD-10-CM | POA: Diagnosis not present

## 2020-11-05 DIAGNOSIS — E7800 Pure hypercholesterolemia, unspecified: Secondary | ICD-10-CM | POA: Diagnosis not present

## 2020-11-26 DIAGNOSIS — M545 Low back pain, unspecified: Secondary | ICD-10-CM | POA: Diagnosis not present

## 2020-11-26 DIAGNOSIS — G8929 Other chronic pain: Secondary | ICD-10-CM | POA: Diagnosis not present

## 2020-12-25 ENCOUNTER — Other Ambulatory Visit: Payer: Self-pay | Admitting: Cardiovascular Disease

## 2021-01-06 ENCOUNTER — Telehealth: Payer: Self-pay | Admitting: Cardiovascular Disease

## 2021-01-06 ENCOUNTER — Other Ambulatory Visit: Payer: Self-pay | Admitting: Cardiovascular Disease

## 2021-01-06 MED ORDER — LOSARTAN POTASSIUM 50 MG PO TABS: ORAL_TABLET | ORAL | 3 refills | Status: DC

## 2021-01-06 NOTE — Telephone Encounter (Signed)
*  STAT* If patient is at the pharmacy, call can be transferred to refill team.   1. Which medications need to be refilled? (please list name of each medication and dose if known)  losartan (COZAAR) 50 MG tablet  2. Which pharmacy/location (including street and city if local pharmacy) is medication to be sent to? [id number] - [place], [place] [address]  3. Do they need a 30 day or 90 day supply?   Patient is awaiting mail order. She is requesting a 2-week supply of medication to hold her over until the mail order arrives. She states she still has enough to get her through the next few days. Per patient, mail order pharmacy informed her that the prescription is still processing so it won't be mailed out instantly.

## 2021-01-06 NOTE — Addendum Note (Signed)
Addended by: Angelena Form on: 01/06/2021 04:15 PM   Modules accepted: Orders

## 2021-02-04 ENCOUNTER — Other Ambulatory Visit: Payer: Self-pay | Admitting: Thoracic Surgery (Cardiothoracic Vascular Surgery)

## 2021-02-04 DIAGNOSIS — I712 Thoracic aortic aneurysm, without rupture, unspecified: Secondary | ICD-10-CM

## 2021-02-25 ENCOUNTER — Inpatient Hospital Stay: Admission: RE | Admit: 2021-02-25 | Payer: Medicare PPO | Source: Ambulatory Visit

## 2021-02-28 ENCOUNTER — Ambulatory Visit
Admission: RE | Admit: 2021-02-28 | Discharge: 2021-02-28 | Disposition: A | Discharge status: Home / Self Care | Payer: Medicare PPO | Source: Ambulatory Visit | Attending: Thoracic Surgery (Cardiothoracic Vascular Surgery) | Admitting: Thoracic Surgery (Cardiothoracic Vascular Surgery)

## 2021-02-28 ENCOUNTER — Ambulatory Visit: Payer: Medicare PPO | Admitting: Cardiovascular Disease

## 2021-02-28 ENCOUNTER — Other Ambulatory Visit: Payer: Self-pay

## 2021-02-28 ENCOUNTER — Encounter: Payer: Self-pay | Admitting: Cardiovascular Disease

## 2021-02-28 VITALS — BP 150/60 | HR 55 | Ht 66.0 in | Wt 136.8 lb

## 2021-02-28 DIAGNOSIS — I712 Thoracic aortic aneurysm, without rupture, unspecified: Secondary | ICD-10-CM

## 2021-02-28 DIAGNOSIS — I493 Ventricular premature depolarization: Secondary | ICD-10-CM

## 2021-02-28 DIAGNOSIS — I10 Essential (primary) hypertension: Secondary | ICD-10-CM | POA: Diagnosis not present

## 2021-02-28 DIAGNOSIS — Z85118 Personal history of other malignant neoplasm of bronchus and lung: Secondary | ICD-10-CM | POA: Diagnosis not present

## 2021-02-28 DIAGNOSIS — Z853 Personal history of malignant neoplasm of breast: Secondary | ICD-10-CM | POA: Diagnosis not present

## 2021-02-28 DIAGNOSIS — Z85828 Personal history of other malignant neoplasm of skin: Secondary | ICD-10-CM | POA: Diagnosis not present

## 2021-02-28 MED ORDER — IOPAMIDOL (ISOVUE-370) INJECTION 76%
75.0000 mL | Freq: Once | INTRAVENOUS | Status: AC | PRN
  Administered 2021-02-28: 75 mL via INTRAVENOUS

## 2021-02-28 NOTE — Patient Instructions (Signed)
Medication Instructions:  Your physician recommends that you continue on your current medications as directed. Please refer to the Current Medication list given to you today.  *If you need a refill on your cardiac medications before your next appointment, please call your pharmacy*   Lab Work: None ordered    Testing/Procedures: Your physician has requested that you have an echocardiogram. Echocardiography is a painless test that uses sound waves to create images of your heart. It provides your doctor with information about the size and shape of your heart and how well your heart's chambers and valves are working. This procedure takes approximately one hour. There are no restrictions for this procedure.    Follow-Up: At [facility], you and your health needs are our priority.  As part of our continuing mission to provide you with exceptional heart care, we have created designated Provider Care Teams.  These Care Teams include your primary Cardiologist (physician) and Advanced Practice Providers (APPs -  Physician Assistants and Nurse Practitioners) who all work together to provide you with the care you need, when you need it.  We recommend signing up for the patient portal called "MyChart".  Sign up information is provided on this After Visit Summary.  MyChart is used to connect with patients for Virtual Visits (Telemedicine).  Patients are able to view lab/test results, encounter notes, upcoming appointments, etc.  Non-urgent messages can be sent to your provider as well.   To learn more about what you can do with MyChart, go to [email or web address].    Your next appointment:   6 month(s)  The format for your next appointment:   In Person  Provider:   Shelva Majestic, MD

## 2021-02-28 NOTE — Progress Notes (Signed)
Patient ID: Haley [name], female   DOB: February 08, 1934, 87 y.o.   MRN: [id number]     HPI: Haley [name] is a 87 y.o. female who presents to the office today for a 10 month cardiology evaluation.  [name]  has a history of superventricular tachycardia in the past has been treated with verapamil as well as beta blocker therapy. Recently, she has done well and now only takes a beta blocker on an as needed basis and admits to only rarely taking this. She has a remote history of breast cancer. She has a history of hiatal hernia with GERD, hyperlipidemia, and also evidence for descending thoracic aorta that is increased at 4.5 cm on a CT scan suggested fusiform aneurysmal dilatation. On the initial CT in 2011 she was found to have a right upper lung nodule. 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 [name]. She has had subsequent CT scan is for followup.  Her last CT angiogram of her chest was on 10/23/2014 which demonstrated a stable descending thoracic aortic aneurysm measuring 4.8 cm maximally.  Semiannual imaging follow-up I, CTA or MRI was recommended. She denies recent chest pain. She denies tachycardia palpitations. She denies PND or orthopnea. She denies bleeding.  Laboratory by her primary physician in February 2016: Her hemoglobin and hematocrit were stable at 12.8 and 39.4.  Chemistry profile was normal.  Chest normal renal function.  Lipids are excellent with a total cholesterol 165, triglycerides 131, HDL 67, LDL 72.  She is followed by [name] for her ascending thoracic aortic aneurysm.  I saw her in May 2018  and she  remained stable and has been without recurrent episodes of SVT.  She  saw [name] on Dec 21, 2017 and her most recent CT showed a stable a sending thoracic aneurysm at 4.7 cm in greatest diameter without evidence for dissection.  There were stable  postoperative changes after prior left lung wedge resection.  There was a stable 7 mm dense calcification in the splenic hilum likely representing a small densely calcified splenic artery aneurysm.  She continues to be on six-month follow-up CT imaging per [name].    She wore an event monitor ordered by [name] from April 10 through Dec 02, 2017.  Her predominant rhythm was sinus rhythm.  There were isolated PVCs and PACs.  There were no episodes of ventricular couplets or episodes of atrial fibrillation.  I saw her in August 2019 at which time she denied any chest pain and was on she denies any episodes of chest pain.  She was been on atorvastatin 20 mg for hyperlipidemia.  In February 2019 LDL cholesterol was 86.  She is on metoprolol 12.5 mg and verapamil 120 mg for hypertension as well as her SVT.  When I saw her, I recommended further titration of atorvastatin to 40 mg.  Over the past 3 months, she has continued to do well.  She underwent follow-up laboratory 2 weeks ago and LDL cholesterol was now at 55.    I saw her in the office in 2019 at which time she continued to do well.  She underwent follow-up laboratory 2 weeks ago and LDL cholesterol was now at 55.  She underwent a repeat CT Angio of her chest on December 3 and saw [name] in follow-up. At that time she had a stable ascending thoracic aneurysm  measuring 4.7 cm.  She recently underwent a follow-up 75-monthevaluation with CT imaging on Dec 22, 2018.   Anterior ascending thoracic aneurysm remains stable measuring 4.6 cm maximally.  She also had ascending aortic atherosclerosis.  There was no acute cardiopulmonary disease.  She is scheduled to see Dr. HRoxan Hockeyin follow-up.   She was evaluated by me in a telemedicine visit in June 2020.  She was continuing to feel well.  Her blood pressures have been running stable in the 118/70-120 2/77 range.  She denies chest pain palpitations shortness of breath.  She has GERD.   Recent laboratory by her primary physician [name] March 4 had shown excellent lipid studies with total cholesterol 145, HDL 74, triglycerides 91 and LDL 53.  Total bilirubin was 1.5 and ALT and AST were normal.    Since her last evaluation, she has continued to feel well.  Earlier this year she had her first P[place]accination and developed chills and fever following the initial injection.  She apparently was treated with antibiotics by her primary physician.  She underwent a follow-up CT in February 2021.  Her ascending thoracic aortic aneurysm was unchanged and measured up to 4.6 cm in diameter.  He was seen by Dr. HRoxan Hockeyin follow-up evaluation.  I last saw her on November 10, 2019.  At that time she continued to be stable and denied any chest pain, PND orthopnea.   She did not have a reaction to her second vaccine.  She has been on atorvastatin 40 mg daily for hyperlipidemia, and losartan 50 mg in the morning and 25 mg in the evening well as verapamil 120 mg daily both for hypertension and palpitations.  Has not had any further episodes of SVT.  [name]  Lipid studies were excellent with total cholesterol 146, HDL 68 and LDL 63.  Chemistries were normal although total bilirubin was minimally increased at 1.5.    Since I last saw her, she underwent a follow-up CT angio of her chest and aorta.  There was aortic atherosclerosis and ascending aortic dilation at 4.5 cm in the proximal ascending segment, 4.3 cm in the upper ascending segment and 4.5 cm on the coronal image.  There was not significant change from prior studies and semiannual imaging was recommended.  Clinically she feels well.  She also has undergone [facility] screening in March 2022 which showed mild bilateral carotid disease.  There was a low probability of osteoporosis.  Abdominal aortic and measurement was normal and she had normal ABIs in her lower extremities.   [facility].  Creatinine was 0.83.  LDL cholesterol was 63 with total cholesterol 146 and HDL 68 with triglycerides 76.  LFTs were normal except bilirubin was minimally increased at 1.5.  She presents for evaluation.  Past Medical History:  Diagnosis Date   Arthritis    knees and left hip   Atrophic gastritis without mention of hemorrhage    Breast cancer (HCC)    Bronchitis, not specified as acute or chronic    [place]    L[place] cancer   Cardiac dysrhythmia, unspecified    Cataract    Chronic pancreatitis (HCC)    Complication of anesthesia    Cough    Esophageal reflux    Family history of malignant neoplasm of gastrointestinal tract    Fatty liver    Flatulence, eructation,  and gas pain    Gastric erosions    [name]   Hiatal hernia    Hoarseness    since virus 2 months agp   Irritable bowel syndrome    Irritable bowel syndrome    LGSIL (low grade squamous intraepithelial dysplasia) 11/15/2014   with biopsy of  benign vaginal cyst   Malignant neoplasm of breast (female), unspecified site    Skin Cancer legs, face, shoulder, some basal some squamous   Other chest pain    Personal history of radiation therapy    Pneumonia, organism unspecified(486)    PONV (postoperative nausea and vomiting)    with hemorrhoid surgery years ago, no n/v with breast surgery   Pure hypercholesterolemia    Shortness of breath    with Exertion   Sorethroat    since virus 2 months ago   Symptomatic menopausal or female climacteric states    Thoracic aortic aneurysm without rupture ([place]) 08/29/12   08/29/12 discovered on PET/CT   Tubular adenoma of colon    Unspecified essential hypertension    Urinary tract infection, site not specified     Past Surgical History:  Procedure Laterality Date   BREAST LUMPECTOMY     BREAST SURGERY     CATARACT EXTRACTION     Bilateral   COLONOSCOPY     EYE SURGERY     HEMORROIDECTOMY     SKIN  CANCER EXCISION     VIDEO ASSISTED THORACOSCOPY (VATS)/WEDGE RESECTION Left 09/19/2012   Procedure: LEFT VIDEO ASSISTED THORACOSCOPY (VATS)/LEFT UPPER LOBE WEDGE RESECTION & NODE DISSECTION;  Surgeon: Melrose Nakayama, MD;  Location: [facility];  Service: Thoracic;  Laterality: Left;    No Known Allergies  Current Outpatient Medications  Medication Sig Dispense Refill   atorvastatin (LIPITOR) 40 MG tablet TAKE 1 TABLET EVERY DAY 90 tablet 2   b complex vitamins tablet Take 1 tablet by mouth daily.     Biotin 10000 MCG TABS Take 1 tablet by mouth daily.     co-enzyme Q-10 30 MG capsule Take 300 mg by mouth daily.     losartan (COZAAR) 50 MG tablet TAKE 1 TABLET BY MOUTH EVERY MORNING AND 1/2 TABLET EVERY EVENING. 135 tablet 3   metoprolol succinate (TOPROL-XL) 25 MG 24 hr tablet Take 0.5 tablets (12.5 mg total) by mouth daily as needed. 15 tablet 3   Omega-3 1000 MG CAPS Take by mouth.     Probiotic Product (PROBIOTIC DAILY PO) Take by mouth.     verapamil (CALAN-SR) 120 MG CR tablet TAKE 1 TABLET (120 MG TOTAL) BY MOUTH DAILY. 90 tablet 3   Vitamin D, Cholecalciferol, 50 MCG (2000 UT) CAPS Take 1 capsule by mouth daily.     No current facility-administered medications for this visit.    Social History   Socioeconomic History   Marital status: Married    Spouse name: Not on file   Number of children: 2   Years of education: Not on file   Highest education level: Not on file  Occupational History   Occupation: Retired    Comment: Tourist information centre manager  Tobacco Use   Smoking status: Never   Smokeless tobacco: Never  Vaping Use   Vaping Use: Never used  Substance and Sexual Activity   Alcohol use: No    Alcohol/week: 0.0 standard drinks   Drug use: No   Sexual activity: Never    Comment: 1st intercourse 87 yo-1 partner  Other Topics Concern   Not on file  Social  History Narrative   Married   Retired   Careers adviser         Social Determinants of Systems developer Strain: Not on file  Food Insecurity: Not on file  Transportation Needs: Not on file  Physical Activity: Not on file  Stress: Not on file  Social Connections: Not on file  Intimate Partner Violence: Not on file    Family History  Problem Relation Age of Onset   Colon cancer Father 62   Cancer Father    Stroke Father    Heart disease Mother    Heart disease Brother    Cancer Brother        mass behind lung-died age 60   Multiple myeloma Brother 76       died age 58   Diabetes Paternal Aunt     ROS General: Negative; No fevers, chills, or night sweats;  HEENT: Negative; No changes in vision or hearing, sinus congestion, difficulty swallowing Pulmonary: Negative; No cough, wheezing, shortness of breath, hemoptysis Cardiovascular: Negative; No chest pain, presyncope, syncope, palpitations; no recurrent SVT GI: Negative; No nausea, vomiting, diarrhea, or abdominal pain GU: Negative; No dysuria, hematuria, or difficulty voiding Musculoskeletal: Negative; no myalgias, joint pain, or weakness Hematologic/Oncology: Negative; no easy bruising, bleeding Endocrine: Negative; no heat/cold intolerance; no diabetes Neuro: Negative; no changes in balance, headaches Skin: Negative; No rashes or skin lesions Psychiatric: Negative; No behavioral problems, depression Sleep: Negative; No snoring, daytime sleepiness, hypersomnolence, bruxism, restless legs, hypnogognic hallucinations, no cataplexy Other comprehensive 14 point system review is negative.   PE BP (!) 150/60 (BP Location: Right Arm)   Pulse (!) 55   Ht '5\' 6"'  (1.676 m)   Wt 136 lb 12.8 oz (62.1 kg)   BMI 22.08 kg/m    Repeat blood pressure by me was 126/70  Wt Readings from Last 3 Encounters:  02/28/21 136 lb 12.8 oz (62.1 kg)  09/10/20 136 lb (61.7 kg)  03/19/20 134 lb (60.8 kg)   General: Alert, oriented, no distress.  Skin: normal turgor, no rashes, warm and dry HEENT: Normocephalic, atraumatic. Pupils equal  round and reactive to light; sclera anicteric; extraocular muscles intact;  Nose without nasal septal hypertrophy Mouth/Parynx benign; Mallinpatti scale 2 Neck: No JVD, no carotid bruits; normal carotid upstroke Lungs: clear to ausculatation and percussion; no wheezing or rales Chest wall: without tenderness to palpitation Heart: PMI not displaced, RRR, s1 s2 normal, 1/6 systolic murmur, no diastolic murmur, no rubs, gallops, thrills, or heaves Abdomen: soft, nontender; no hepatosplenomehaly, BS+; abdominal aorta nontender and not dilated by palpation. Back: no CVA tenderness Pulses 2+ Musculoskeletal: full range of motion, normal strength, no joint deformities Extremities: no clubbing cyanosis or edema, Homan's sign negative  Neurologic: grossly nonfocal; Cranial nerves grossly wnl Psychologic: Normal mood and affect   February 28, 2021 ECG (independently read by me): Sinus bradycardia at 55, [place] PVCs, 1st degree AV block  April 2021 ECG (independently read by me): Sinus bradycardia at 49 bpm with sinus arrhythmia and first-degree AV block with a.  Of 02 16 ms.  Poor anterior R wave progression.  November 2019  ECG (independently read by me): Sinus bradycardia 56 bpm.  First-degree AV block.  Able to 60 ms.  No ectopy.  No significant ST changes.  August 2019 ECG (independently read by me): Sinus bradycardia 53 bpm.  First-degree AV block with a PR interval of 216 ms.  Poor R wave progression V1 through V3.  May 2018 ECG (independently read by me): Sinus bradycardia 56 bpm.  Mild sinus arrhythmia.  Poor R-wave progression V1 V2 2.  Normal intervals.  April 2016 ECG (independently read by me): Sinus bradycardia at 51 bpm.  Poor progression V1 through V3.  QTc interval 420 ms.  September 2014 ECG: Sinus rhythm 58 beats per minute. Intervals normal.  LABS: I personally reviewed  laboratory done on 09/10/2016 by Harlene Ramus, M.D., done at [facility]  at [place] & Units 06/02/2018 09/05/2015 05/20/2015  Glucose 65 - 99 mg/dL 84 86 -  BUN 8 - 27 mg/dL 14 14 -  Creatinine 0.57 - 1.00 mg/dL 0.81 0.80 0.9  BUN/Creat Ratio 12 - 28 17 - -  Sodium 134 - 144 mmol/L 141 140 -  Potassium 3.5 - 5.2 mmol/L 4.9 4.0 -  Chloride 96 - 106 mmol/L 102 106 -  CO2 20 - 29 mmol/L 25 22 -  Calcium 8.7 - 10.3 mg/dL 9.3 8.9 -    Hepatic Function Latest Ref Rng & Units 06/02/2018 09/05/2015 09/11/2014  Total Protein 6.0 - 8.5 g/dL 6.8 6.7 6.6  Albumin 3.5 - 4.7 g/dL 4.5 3.9 3.9  AST 0 - 40 IU/L '27 17 14  ' ALT 0 - 32 IU/L '15 10 9  ' Alk Phosphatase 39 - 117 IU/L 72 59 56  Total Bilirubin 0.0 - 1.2 mg/dL 1.2 1.1 1.1    CBC Latest Ref Rng & Units 06/02/2018 09/05/2015 09/11/2014  WBC 3.4 - 10.8 x10E3/uL 4.6 3.8(L) 4.7  Hemoglobin 11.1 - 15.9 g/dL 12.4 12.2 12.8  Hematocrit 34.0 - 46.6 % 37.1 36.3 39.4  Platelets 150 - 450 x10E3/uL 284 240 294   Lab Results  Component Value Date   TSH 4.002 07/05/2012    BNP No results found for: PROBNP  Lipid Panel     Component Value Date/Time   CHOL 155 06/02/2018 1041   TRIG 84 06/02/2018 1041   HDL 83 06/02/2018 1041   CHOLHDL 1.9 06/02/2018 1041   CHOLHDL 1.9 09/05/2015 0854   VLDL 19 09/05/2015 0854   LDLCALC 55 06/02/2018 1041     RADIOLOGY: No results found.  IMPRESSION:  No diagnosis found.   ASSESSMENT AND PLAN: Ms. Gama is a young appearing 87 year old female who continues to do well from a cardiovascular standpoint. She has a history of paroxysmal supraventricular tachycardia and this has continued to be well controlled with verapamil 120 mg.  She also was on low-dose metoprolol succinate but due to bradycardia this ultimately was discontinued and she rarely takes on a as needed basis.  Her blood pressure today on recheck by me at 126/70 well controlled and she continues to take losartan 50 mg in addition to her verapamil.  Her ECG today shows sinus bradycardia 55 bpm.  Her heart rate has increased since her  last evaluation.  She remains asymptomatic and is unaware of any palpitations.  She continues to be on atorvastatin 40 mg daily and over-the-counter fish oil for her lipids.  I reviewed her most recent CT angio of her chest and aorta and her previously documented ascending aortic dilatation is stable.  She continues to see [name].  She is status post left upper lobe wedge resection in 2014 for adenocarcinoma.  At times she may experience some very mild dizziness.  She has not had an echo in many years.  I have suggested an echo Doppler assessment.  I will contact her with the results.  As long as she remains stable I will see her in 6 months for reevaluation.  Troy Sine, MD, [facility]  02/28/2021 1:42 PM

## 2021-03-02 ENCOUNTER — Encounter: Payer: Self-pay | Admitting: Cardiovascular Disease

## 2021-03-17 ENCOUNTER — Other Ambulatory Visit: Payer: Self-pay

## 2021-03-17 ENCOUNTER — Ambulatory Visit (HOSPITAL_COMMUNITY): Payer: Medicare PPO | Attending: Internal Medicine

## 2021-03-17 DIAGNOSIS — I493 Ventricular premature depolarization: Secondary | ICD-10-CM | POA: Diagnosis not present

## 2021-03-17 DIAGNOSIS — I080 Rheumatic disorders of both mitral and aortic valves: Secondary | ICD-10-CM

## 2021-03-17 DIAGNOSIS — R0602 Shortness of breath: Secondary | ICD-10-CM | POA: Insufficient documentation

## 2021-03-17 DIAGNOSIS — I351 Nonrheumatic aortic (valve) insufficiency: Secondary | ICD-10-CM | POA: Diagnosis not present

## 2021-03-17 LAB — ECHOCARDIOGRAM COMPLETE
Area-P 1/2: 3.42 cm2
P 1/2 time: 912 msec
S' Lateral: 2.8 cm

## 2021-03-25 ENCOUNTER — Encounter: Payer: Self-pay | Admitting: Thoracic Surgery (Cardiothoracic Vascular Surgery)

## 2021-03-25 ENCOUNTER — Other Ambulatory Visit: Payer: Self-pay

## 2021-03-25 ENCOUNTER — Ambulatory Visit: Payer: Medicare PPO | Admitting: Thoracic Surgery (Cardiothoracic Vascular Surgery)

## 2021-03-25 VITALS — BP 117/65 | HR 67 | Resp 20 | Ht 66.0 in | Wt 133.0 lb

## 2021-03-25 DIAGNOSIS — R911 Solitary pulmonary nodule: Secondary | ICD-10-CM

## 2021-03-25 DIAGNOSIS — Z85118 Personal history of other malignant neoplasm of bronchus and lung: Secondary | ICD-10-CM

## 2021-03-25 DIAGNOSIS — I712 Thoracic aortic aneurysm, without rupture, unspecified: Secondary | ICD-10-CM

## 2021-03-25 NOTE — Progress Notes (Signed)
[address]       [place] [address]             [phone]     HPI: Haley [name] returns for follow-up of her ascending aneurysm and lung nodule.  Haley [name] is an 87 year old woman with a history of stage Ia lung cancer, hypertension, hyperlipidemia, supraventricular tachycardia, breast cancer, skin cancer, chronic pancreatitis, reflux, irritable bowel syndrome, and an ascending aortic aneurysm.  She had a groundglass opacity in her left upper lobe that was resected in 2014.  It turned out to be a stage Ia adenocarcinoma.  She had a 4.5 cm aneurysm noted on her CT.  She also has a groundglass opacity in the right upper lobe that has been stable over time dating back to 2014.  I last saw her in February.  The aneurysm and lung nodule were both stable.  She saw Dr. Claiborne Billings a couple weeks ago.  She is very concerned about an echocardiogram showing moderate aortic insufficiency.  She also was having some rhythm issues.  She denies any chest pain, pressure, or tightness.  She denied having any orthopnea or peripheral edema.  Past Medical History:  Diagnosis Date   Arthritis    knees and left hip   Atrophic gastritis without mention of hemorrhage    Breast cancer ([place])    Bronchitis, not specified as acute or chronic    Cancer ([place])    Lung cancer   Cardiac dysrhythmia, unspecified    Cataract    Chronic pancreatitis (HCC)    Complication of anesthesia    Cough    Esophageal reflux    Family history of malignant neoplasm of gastrointestinal tract    Fatty liver    Flatulence, eructation, and gas pain    Gastric erosions    [place]   Hiatal hernia    Hoarseness    since virus 2 months agp   Irritable bowel syndrome    Irritable bowel syndrome    LGSIL (low grade squamous intraepithelial dysplasia) 11/15/2014   with biopsy of  benign vaginal cyst   Malignant neoplasm of breast (female), unspecified site    Skin Cancer legs, face, shoulder, some basal  some squamous   Other chest pain    Personal history of radiation therapy    Pneumonia, organism unspecified(486)    PONV (postoperative nausea and vomiting)    with hemorrhoid surgery years ago, no n/v with breast surgery   Pure hypercholesterolemia    Shortness of breath    with Exertion   Sorethroat    since virus 2 months ago   Symptomatic menopausal or female climacteric states    Thoracic aortic aneurysm without rupture ([place]) 08/29/12   08/29/12 discovered on PET/CT   Tubular adenoma of colon    Unspecified essential hypertension    Urinary tract infection, site not specified     Current Outpatient Medications  Medication Sig Dispense Refill   atorvastatin (LIPITOR) 40 MG tablet TAKE 1 TABLET EVERY DAY 90 tablet 2   b complex vitamins tablet Take 1 tablet by mouth daily.     Biotin 10000 MCG TABS Take 1 tablet by mouth daily.     co-enzyme Q-10 30 MG capsule Take 300 mg by mouth daily.     losartan (COZAAR) 50 MG tablet TAKE 1 TABLET BY MOUTH EVERY MORNING AND 1/2 TABLET EVERY EVENING. 135 tablet 3   metoprolol succinate (TOPROL-XL) 25 MG 24 hr tablet Take 0.5  tablets (12.5 mg total) by mouth daily as needed. 15 tablet 3   Omega-3 1000 MG CAPS Take by mouth.     Probiotic Product (PROBIOTIC DAILY PO) Take by mouth.     verapamil (CALAN-SR) 120 MG CR tablet TAKE 1 TABLET (120 MG TOTAL) BY MOUTH DAILY. 90 tablet 3   Vitamin D, Cholecalciferol, 50 MCG (2000 UT) CAPS Take 1 capsule by mouth daily.     No current facility-administered medications for this visit.    Physical Exam BP 117/65   Pulse 67   Resp 20   Ht 5\' 6"  (1.676 m)   Wt 133 lb (60.3 kg)   SpO2 97%   BMI 21.41 kg/m  87 year old woman in no acute distress Alert and oriented x3 with no focal deficits No carotid bruits Cardiac regular rate and rhythm, no audible murmur Lungs clear No peripheral edema  Diagnostic Tests: CT ANGIOGRAPHY CHEST WITH CONTRAST   TECHNIQUE: Multidetector CT imaging of the chest  was performed using the standard protocol during bolus administration of intravenous contrast. Multiplanar CT image reconstructions and MIPs were obtained to evaluate the vascular anatomy.   CONTRAST:  45mL ISOVUE-370 IOPAMIDOL (ISOVUE-370) INJECTION 76%   COMPARISON:  09/10/2020   FINDINGS: Cardiovascular: Aortic atherosclerosis. Ascending aortic dilatation including at 4.5 cm in the proximal ascending segment on 83/5, similar. 4.3 cm in the upper ascending segment on 63/5, similar. 4.5 cm on coronal reformat image 73, similar.   Normal heart size, without pericardial effusion. No central pulmonary embolism, on this non-dedicated study. Normal caliber of the great vessels, transverse, and descending thoracic aorta. Tortuous thoracic aorta.   Mediastinum/Nodes: No mediastinal or hilar adenopathy.   Lungs/Pleura: No pleural fluid. Right medial apical mixed attenuation nodule is similar. Example 2.2 cm on 23/7 with solid components on the order of 2-3 mm.   Right middle lobe scarring medially.   Right lower lobe peribronchovascular "tree-in-bud" nodularity again identified.   Posterior left upper lobe surgical sutures, without local recurrence.   Upper Abdomen: Normal imaged portions of the liver, spleen, stomach, adrenal glands. Mild renal cortical thinning bilaterally.   Borderline pancreatic duct dilatation is similar and may be within normal variation in this age group.   Musculoskeletal: No acute osseous abnormality.   Review of the MIP images confirms the above findings.   IMPRESSION: 1. Similar ascending aortic aneurysm, maximally 4.5 cm. Ascending thoracic aortic aneurysm. Recommend semi-annual imaging followup by CTA or MRA and referral to cardiothoracic surgery if not already obtained. This recommendation follows 2010 ACCF/AHA/AATS/ACR/ASA/SCA/SCAI/SIR/STS/SVM Guidelines for the Diagnosis and Management of Patients With Thoracic Aortic Disease. Circulation.  2010; 121: I948-N462. Aortic aneurysm NOS (ICD10-I71.9) 2. Similar right apical mixed attenuation nodule. Recommend attention on follow-up. 3. Similar right lower lobe peribronchovascular tree-in-bud nodularity, likely post infectious or inflammatory. 4.  Aortic Atherosclerosis (ICD10-I70.0).     Electronically Signed   By: Abigail Miyamoto M.D.   On: 02/28/2021 11:04 I personally reviewed the CT images.  There is a 4.7 cm ascending aneurysm unchanged from her previous scan.  Right upper lobe lung nodule unchanged dating back to 2014.  Aortic atherosclerosis.  Impression: Haley [name] is an 87 year old woman with a history of stage Ia lung cancer, hypertension, hyperlipidemia, supraventricular tachycardia, breast cancer, skin cancer, chronic pancreatitis, reflux, irritable bowel syndrome, and an ascending aortic aneurysm.  Ascending aneurysm and thoracic aortic atherosclerosis-aneurysm stable at 4.7 to 4.8 cm.  Importance of blood pressure control emphasized.  She does not check her self at home periodically  and her blood pressure has been well controlled.  Hypertension-blood pressure well controlled on current regimen.  Lung nodule-mixed density nodule in the right apex.  Has been stable for almost a decade.  We will continue to follow on scans.  Moderate aortic insufficiency-explained her this may be related to her aneurysm.  Also explained that LV function was normal and this may never cause her any problems.  She will need follow-up echo as per Dr. Claiborne Billings.  Plan: Return in 6 months with CT angio of chest  Melrose Nakayama, MD [facility] [phone]

## 2021-03-28 ENCOUNTER — Telehealth: Payer: Self-pay | Admitting: Cardiovascular Disease

## 2021-03-28 NOTE — Telephone Encounter (Signed)
Returned the call to the patient. She stated that she had an episode of afib yesterday as recorded on her apple watch. She stated that she feels like the episode lasted 30 minutes to an hour. Her heart rate ranged between 71-123. She denied chest pain and shortness of breath. She stated that she was nauseous and had stomach pain. She did not take her prn Metoprolol. She converted on her own.  Her heart rate and blood pressure today was 51 and 128/86. She denies afib and is currently asymptomatic except for some gas pains which has gotten better with Tums. She stated that she does have a hernia which could be causing the pain. She has been advised to reach out to that provider as well.  She has been advised to try and relax today and to call back if she has another episode. She has also been advised to call the on call this weekend if anything further is needed.

## 2021-03-28 NOTE — Telephone Encounter (Signed)
Patient c/o Palpitations:  High priority if patient c/o lightheadedness, shortness of breath, or chest pain  How long have you had palpitations/irregular HR/ Afib? Are you having the symptoms now?  No not at this moment   Are you currently experiencing lightheadedness, SOB or CP? No pain now but yesterday she had pain near her breast bone  Do you have a history of afib (atrial fibrillation) or irregular heart rhythm?  yes  Have you checked your BP or HR? (document readings if available): 128/86 Pulse 51  Are you experiencing any other symptoms? Tiredness, nausea, gassy, pt have not eaten anything today. All of these symptoms occurred yesterday.

## 2021-04-02 NOTE — Telephone Encounter (Signed)
Agree.  Can take an extra metoprolol PRN if recurrent AF.

## 2021-04-02 NOTE — Telephone Encounter (Signed)
Called patient, LVM advised of message from MD below.  Advised to call back if questions or concerns.

## 2021-04-08 ENCOUNTER — Other Ambulatory Visit (INDEPENDENT_AMBULATORY_CARE_PROVIDER_SITE_OTHER): Payer: Medicare PPO

## 2021-04-08 ENCOUNTER — Encounter: Payer: Self-pay | Admitting: Physician Assistant

## 2021-04-08 ENCOUNTER — Ambulatory Visit: Payer: Medicare PPO | Admitting: Physician Assistant

## 2021-04-08 VITALS — BP 138/80 | HR 59 | Ht 66.0 in | Wt 132.4 lb

## 2021-04-08 DIAGNOSIS — D2262 Melanocytic nevi of left upper limb, including shoulder: Secondary | ICD-10-CM | POA: Diagnosis not present

## 2021-04-08 DIAGNOSIS — D225 Melanocytic nevi of trunk: Secondary | ICD-10-CM | POA: Diagnosis not present

## 2021-04-08 DIAGNOSIS — L821 Other seborrheic keratosis: Secondary | ICD-10-CM | POA: Diagnosis not present

## 2021-04-08 DIAGNOSIS — R1033 Periumbilical pain: Secondary | ICD-10-CM

## 2021-04-08 DIAGNOSIS — L814 Other melanin hyperpigmentation: Secondary | ICD-10-CM | POA: Diagnosis not present

## 2021-04-08 DIAGNOSIS — D1801 Hemangioma of skin and subcutaneous tissue: Secondary | ICD-10-CM | POA: Diagnosis not present

## 2021-04-08 DIAGNOSIS — R109 Unspecified abdominal pain: Secondary | ICD-10-CM

## 2021-04-08 DIAGNOSIS — Z86010 Personal history of colonic polyps: Secondary | ICD-10-CM

## 2021-04-08 DIAGNOSIS — D2261 Melanocytic nevi of right upper limb, including shoulder: Secondary | ICD-10-CM | POA: Diagnosis not present

## 2021-04-08 DIAGNOSIS — Z85828 Personal history of other malignant neoplasm of skin: Secondary | ICD-10-CM | POA: Diagnosis not present

## 2021-04-08 DIAGNOSIS — L820 Inflamed seborrheic keratosis: Secondary | ICD-10-CM | POA: Diagnosis not present

## 2021-04-08 DIAGNOSIS — Z860101 Personal history of adenomatous and serrated colon polyps: Secondary | ICD-10-CM

## 2021-04-08 DIAGNOSIS — D2271 Melanocytic nevi of right lower limb, including hip: Secondary | ICD-10-CM | POA: Diagnosis not present

## 2021-04-08 LAB — URINALYSIS
Bilirubin Urine: NEGATIVE
Hgb urine dipstick: NEGATIVE
Ketones, ur: NEGATIVE
Leukocytes,Ua: NEGATIVE
Nitrite: NEGATIVE
Specific Gravity, Urine: 1.015 (ref 1.000–1.030)
Total Protein, Urine: NEGATIVE
Urine Glucose: NEGATIVE
Urobilinogen, UA: 0.2 (ref 0.0–1.0)
pH: 6 (ref 5.0–8.0)

## 2021-04-08 LAB — CBC WITH DIFFERENTIAL/PLATELET
Basophils Absolute: 0.1 10*3/uL (ref 0.0–0.1)
Basophils Relative: 1 % (ref 0.0–3.0)
Eosinophils Absolute: 0 10*3/uL (ref 0.0–0.7)
Eosinophils Relative: 0.8 % (ref 0.0–5.0)
HCT: 36.5 % (ref 36.0–46.0)
Hemoglobin: 12 g/dL (ref 12.0–15.0)
Lymphocytes Relative: 29.2 % (ref 12.0–46.0)
Lymphs Abs: 1.7 10*3/uL (ref 0.7–4.0)
MCHC: 32.8 g/dL (ref 30.0–36.0)
MCV: 93.4 fl (ref 78.0–100.0)
Monocytes Absolute: 0.6 10*3/uL (ref 0.1–1.0)
Monocytes Relative: 10.4 % (ref 3.0–12.0)
Neutro Abs: 3.4 10*3/uL (ref 1.4–7.7)
Neutrophils Relative %: 58.6 % (ref 43.0–77.0)
Platelets: 261 10*3/uL (ref 150.0–400.0)
RBC: 3.91 Mil/uL (ref 3.87–5.11)
RDW: 14 % (ref 11.5–15.5)
WBC: 5.9 10*3/uL (ref 4.0–10.5)

## 2021-04-08 LAB — COMPREHENSIVE METABOLIC PANEL
ALT: 10 U/L (ref 0–35)
AST: 17 U/L (ref 0–37)
Albumin: 4.1 g/dL (ref 3.5–5.2)
Alkaline Phosphatase: 63 U/L (ref 39–117)
BUN: 23 mg/dL (ref 6–23)
CO2: 27 mEq/L (ref 19–32)
Calcium: 9.1 mg/dL (ref 8.4–10.5)
Chloride: 102 mEq/L (ref 96–112)
Creatinine, Ser: 0.97 mg/dL (ref 0.40–1.20)
GFR: 53.59 mL/min — ABNORMAL LOW (ref >60.00)
Glucose, Bld: 94 mg/dL (ref 70–99)
Potassium: 4.5 mEq/L (ref 3.5–5.1)
Sodium: 137 mEq/L (ref 135–145)
Total Bilirubin: 1.3 mg/dL — ABNORMAL HIGH (ref 0.2–1.2)
Total Protein: 7 g/dL (ref 6.0–8.3)

## 2021-04-08 LAB — LIPASE: Lipase: 51 U/L (ref 11.0–59.0)

## 2021-04-08 NOTE — Patient Instructions (Addendum)
If you are age 85 or older, your body mass index should be between 23-30. Your Body mass index is 21.37 kg/m. If this is out of the aforementioned range listed, please consider follow up with your Primary Care Provider. __________________________________________________________  The [facility] providers would like to encourage you to use [facility] to communicate with providers for non-urgent requests or questions.  Due to long hold times on the telephone, sending your provider a message by [facility] may be a faster and more efficient way to get a response.  Please allow 48 business hours for a response.  Please remember that this is for non-urgent requests.   You have been scheduled for a CT scan of the abdomen and pelvis at [facility] ([address]---this is in the same building as [employer]).   You are scheduled on 04/11/2021 at 3:30 pm. You should arrive 15 minutes prior to your appointment time for registration. Please follow the written instructions below on the day of your exam:  WARNING: IF YOU ARE ALLERGIC TO IODINE/X-RAY DYE, PLEASE NOTIFY RADIOLOGY IMMEDIATELY AT [phone]! YOU WILL BE GIVEN A 13 HOUR PREMEDICATION PREP.  1) Do not eat or drink anything after 11:30 am (4 hours prior to your test) 2) You have been given 2 bottles of oral contrast to drink. The solution may taste better if refrigerated, but do NOT add ice or any other liquid to this solution. Shake well before drinking.    Drink 1 bottle of contrast @ 1:30 pm (2 hours prior to your exam)  Drink 1 bottle of contrast @ 2:30 pm (1 hour prior to your exam)  You may take any medications as prescribed with a small amount of water, if necessary. If you take any of the following medications: METFORMIN, GLUCOPHAGE, GLUCOVANCE, AVANDAMET, RIOMET, FORTAMET, [place] MET, JANUMET, GLUMETZA or METAGLIP, you MAY be asked to HOLD this medication 48 hours AFTER the exam.  The purpose of you drinking the oral  contrast is to aid in the visualization of your intestinal tract. The contrast solution may cause some diarrhea. Depending on your individual set of symptoms, you may also receive an intravenous injection of x-ray contrast/dye. Plan on being at [facility] for 30 minutes or longer, depending on the type of exam you are having performed.  This test typically takes 30-45 minutes to complete.  If you have any questions regarding your exam or if you need to reschedule, you may call the CT department at [phone] between the hours of 8:00 am and 5:00 pm, Monday-Friday. ____________________________________________________________  Your provider has requested that you go to the basement level for lab work before leaving today. Press "B" on the elevator. The lab is located at the first door on the left as you exit the elevator.  Follow up pending the results of your CT.  Thank you for entrusting me with your care and choosing [facility].  Amy Esterwood, PA-C

## 2021-04-08 NOTE — Progress Notes (Signed)
Subjective:    Patient ID: Haley [name], female    DOB: 1933-12-01, 87 y.o.   MRN: [id number]  HPI Haley [name] is a pleasant 87 year old [place] female, established with Dr. Hilarie Fredrickson who was last seen in 2018 when she had colonoscopy.  She comes in today with complaints of abdominal pain which has been present over the past 2 to 3 weeks. Patient says that at onset of symptoms she felt discomfort in her upper abdomen and mid abdomen and then this gradually localized to the lower mid abdomen.  The pain has persisted over the past couple of weeks and has been constant and dull in nature.  She has not noticed any change in her bowel habits, no melena or hematochezia.  Initially her appetite was decreased but she says she is trying to push herself to eat.  She has not noticed any specific change in symptoms with p.o. intake.  No associated nausea or vomiting.  No urinary symptoms.  No fever or chills. At last colonoscopy September 2018 she had 2 polyps removed 3 to 5 mm in size and was noted to have internal hemorrhoids.  Path consistent with sessile serrated polyps. She has history of atrial fibrillation, SVT, hypertension, history of an a sending aortic aneurysm which is being followed and she just underwent repeat imaging which shows this to be 4.7 cm unchanged.  She has history of lung cancer stage I status post resection, GERD, gastritis, IBS and prior history of breast cancer.  No prior abdominal surgery. She is not taking any regular NSAIDs, no regular PPI use.  Review of Systems Pertinent positive and negative review of systems were noted in the above HPI section.  All other review of systems was otherwise negative.   Outpatient Encounter Medications as of 04/08/2021  Medication Sig   atorvastatin (LIPITOR) 40 MG tablet TAKE 1 TABLET EVERY DAY   b complex vitamins tablet Take 1 tablet by mouth daily.   Biotin 10000 MCG TABS Take 1 tablet by mouth daily.   co-enzyme Q-10 30 MG capsule Take 300 mg by  mouth daily.   losartan (COZAAR) 50 MG tablet TAKE 1 TABLET BY MOUTH EVERY MORNING AND 1/2 TABLET EVERY EVENING.   metoprolol succinate (TOPROL-XL) 25 MG 24 hr tablet Take 0.5 tablets (12.5 mg total) by mouth daily as needed.   Omega-3 1000 MG CAPS Take by mouth.   Probiotic Product (PROBIOTIC DAILY PO) Take by mouth.   verapamil (CALAN-SR) 120 MG CR tablet TAKE 1 TABLET (120 MG TOTAL) BY MOUTH DAILY.   Vitamin D, Cholecalciferol, 50 MCG (2000 UT) CAPS Take 1 capsule by mouth daily.   No facility-administered encounter medications on file as of 04/08/2021.   No Known Allergies Patient Active Problem List   Diagnosis Date Noted   Viral upper respiratory tract infection 04/27/2016   Hyperlipidemia LDL goal <70 11/08/2014   Ascending aortic aneurysm ([place]) 05/16/2013   Lung cancer, upper lobe ([place]) 05/16/2013   Paroxysmal SVT (supraventricular tachycardia) ([place]) 04/03/2013   Thoracic aortic aneurysm without rupture ([place]) 08/29/2012   Pulmonary nodules 03/20/2011   GERD (gastroesophageal reflux disease) 03/17/2011   Esophageal motility disorder 03/17/2011   Family history of malignant neoplasm of gastrointestinal tract 03/17/2011   CHEST PAIN 03/19/2009   CARCINOMA, BREAST 08/13/2007   HYPERCHOLESTEROLEMIA 08/13/2007   Essential hypertension 08/13/2007   IRREGULAR HEART RATE 08/13/2007   PNEUMONIA 08/13/2007   BRONCHITIS 08/13/2007   GERD 08/13/2007   IRRITABLE BOWEL SYNDROME 08/13/2007   UTI'S, RECURRENT  08/13/2007   POSTMENOPAUSAL STATUS 08/13/2007   COUGH, CHRONIC 08/13/2007   CHEST PAIN, NON-CARDIAC 08/13/2007   GASTRITIS, CHRONIC 03/12/2006   Social History   Socioeconomic History   Marital status: Married    Spouse name: Not on file   Number of children: 2   Years of education: Not on file   Highest education level: Not on file  Occupational History   Occupation: Retired    Comment: Tourist information centre manager  Tobacco Use   Smoking status: Never   Smokeless tobacco: Never  Vaping  Use   Vaping Use: Never used  Substance and Sexual Activity   Alcohol use: No    Alcohol/week: 0.0 standard drinks   Drug use: No   Sexual activity: Never    Comment: 1st intercourse 50 yo-1 partner  Other Topics Concern   Not on file  Social History Narrative   Married   Retired   Careers adviser         Social Determinants of Health   Financial Resource Strain: Not on file  Food Insecurity: Not on file  Transportation Needs: Not on file  Physical Activity: Not on file  Stress: Not on file  Social Connections: Not on file  Intimate Partner Violence: Not on file    Haley [name]'s family history includes Cancer in her brother and father; Colon cancer (age of onset: 56) in her father; Diabetes in her paternal aunt; Heart disease in her brother and mother; Liver cancer in her brother; Multiple myeloma (age of onset: 6) in her brother; Stroke in her father.      Objective:    Vitals:   04/08/21 1107  BP: 138/80  Pulse: (!) 59  SpO2: 98%    Physical Exam Well-developed well-nourished elderly [place] female in no acute distress.  Height, Weight, 132 BMI  21.37  HEENT; nontraumatic normocephalic, EOMI, PE R LA, sclera anicteric. Oropharynx; not examined today Neck; supple, no JVD Cardiovascular; regular rate and rhythm with S1-S2, no murmur rub or gallop Pulmonary; Clear bilaterally Abdomen; soft, she is tender in the periumbilical region and also in the left mid quadrant, no guarding or rebound ,nondistended, no palpable mass or hepatosplenomegaly, bowel sounds are active Rectal; not done today Skin; benign exam, no jaundice rash or appreciable lesions Extremities; no clubbing cyanosis or edema skin warm and dry Neuro/Psych; alert and oriented x4, grossly nonfocal mood and affect appropriate        Assessment & Plan:   #24 87 year old [place] female with 2 to 3-week history of persistent constant mid abdominal pain with tenderness in the periumbilical region  and left mid quadrant on exam. Etiology of pain is not clear, no prior history of diverticulitis and no diverticuli noted at the time of prior colonoscopy.  Rule out intra-abdominal inflammatory process versus neoplasm  #2 history of adenomatous and sessile serrated polyps-up-to-date with colonoscopy last done 2018 #3 history of breast cancer 4.  History of lung cancer stage I status postresection 5.  Ascending aortic aneurysm, being followed by vascular surgery recent imaging stable at 4.7 cm #6 history of hypertension 7.  History of atrial fibrillation  Plan; CBC with differential, c-Met, lipase, UA Patient will be scheduled for CT of the abdomen and pelvis with contrast this week.  Further recommendations pending results of labs and CT. Patient is advised to call for advice should she have any worsening of the symptoms in the interim prior to CT.  Hakeem Frazzini S Jung Ingerson PA-C 04/08/2021   Cc: [place],  Bill Salinas, MD

## 2021-04-11 ENCOUNTER — Ambulatory Visit (INDEPENDENT_AMBULATORY_CARE_PROVIDER_SITE_OTHER)
Admission: RE | Admit: 2021-04-11 | Discharge: 2021-04-11 | Disposition: A | Discharge status: Home / Self Care | Payer: Medicare PPO | Source: Ambulatory Visit | Attending: Physician Assistant | Admitting: Physician Assistant

## 2021-04-11 ENCOUNTER — Other Ambulatory Visit: Payer: Self-pay

## 2021-04-11 DIAGNOSIS — R1033 Periumbilical pain: Secondary | ICD-10-CM

## 2021-04-11 DIAGNOSIS — Z86010 Personal history of colonic polyps: Secondary | ICD-10-CM

## 2021-04-11 DIAGNOSIS — R109 Unspecified abdominal pain: Secondary | ICD-10-CM

## 2021-04-11 MED ORDER — IOHEXOL 300 MG/ML  SOLN
85.0000 mL | Freq: Once | INTRAMUSCULAR | Status: AC | PRN
  Administered 2021-04-11: 85 mL via INTRAVENOUS

## 2021-05-15 DIAGNOSIS — Z23 Encounter for immunization: Secondary | ICD-10-CM | POA: Diagnosis not present

## 2021-07-22 ENCOUNTER — Other Ambulatory Visit: Payer: Self-pay | Admitting: Thoracic Surgery (Cardiothoracic Vascular Surgery)

## 2021-07-22 DIAGNOSIS — I7121 Aneurysm of the ascending aorta, without rupture: Secondary | ICD-10-CM

## 2021-07-23 ENCOUNTER — Telehealth: Payer: Self-pay | Admitting: Cardiovascular Disease

## 2021-07-23 NOTE — Telephone Encounter (Signed)
Spoke to pt. She report she has a history of palpations but since last night they have worsened. She state around midnight HR was 119. Pt report she took PRN metoprolol 12.5 mg and symptoms improved. Around 4 am she was awoken again with palpations and apple watch alerted irregular heart rhythm. Pt took another PRN metoprolol around 7:45 but report after walking to the mailbox she was dizzy, sweating, and HR was 134. HR currently 78.  Appointment scheduled for 12/30 with Dr. Debara Pickett for further evaluations. Pt made aware of ED precaution should any new symptoms develop or worsen.

## 2021-07-23 NOTE — Telephone Encounter (Signed)
STAT if HR is under 50 or over 120 (normal HR is 60-100 beats per minute)  What is your heart rate?  Irregular heart rate-  earlier 119, now 76, other readings 124,134,54 and 90  Do you have a log of your heart rate readings (document readings)?   Do you have any other symptoms? sweating, dizzy, earlier, gas and indigestion

## 2021-07-25 ENCOUNTER — Ambulatory Visit: Payer: Medicare PPO | Admitting: Internal Medicine

## 2021-07-25 ENCOUNTER — Encounter: Payer: Self-pay | Admitting: Internal Medicine

## 2021-07-25 ENCOUNTER — Other Ambulatory Visit: Payer: Self-pay

## 2021-07-25 VITALS — BP 132/80 | HR 51 | Ht 66.0 in | Wt 135.0 lb

## 2021-07-25 DIAGNOSIS — R002 Palpitations: Secondary | ICD-10-CM

## 2021-07-25 MED ORDER — METOPROLOL TARTRATE 25 MG PO TABS: ORAL_TABLET | ORAL | 3 refills | Status: DC

## 2021-07-25 NOTE — Patient Instructions (Signed)
Medication Instructions:  Dr. Debara Pickett has prescribed metoprolol tartrate 25mg  tablets -- take 1/2 -- 1 tablet twice daily as needed for palpitations   *If you need a refill on your cardiac medications before your next appointment, please call your pharmacy*  Follow-Up: At [facility], you and your health needs are our priority.  As part of our continuing mission to provide you with exceptional heart care, we have created designated Provider Care Teams.  These Care Teams include your primary Cardiologist (physician) and Advanced Practice Providers (APPs -  Physician Assistants and Nurse Practitioners) who all work together to provide you with the care you need, when you need it.  We recommend signing up for the patient portal called "MyChart".  Sign up information is provided on this After Visit Summary.  MyChart is used to connect with patients for Virtual Visits (Telemedicine).  Patients are able to view lab/test results, encounter notes, upcoming appointments, etc.  Non-urgent messages can be sent to your provider as well.   To learn more about what you can do with MyChart, go to [email or web address].    Your next appointment:   Aug 27, 2021 with Dr. Claiborne Billings

## 2021-07-25 NOTE — Progress Notes (Signed)
OFFICE NOTE  Chief Complaint:  Palpitations  Primary Care Physician: Haley Nip, MD  HPI:  Haley [name] is a 87 y.o. female with a past medial history significant for palpitations and SVT in the past.  She is seen today for an acute visit as the DOD for Dr. Claiborne [name] for palpitations.  She has been a patient of his for more than 40 years.  She has been maintained on verapamil and has had good control of her palpitations and had Toprol XL for breakthrough.  She said the other day she had some worsening palpitations and took of Toprol XL and then ultimately took some medication from her husband's pillbox.  She noted a heart rate got down into the 30s but her symptoms had resolved.  She was advised before by Dr. Claiborne [name] not to take the daily metoprolol because of bradycardia.  She is now not sure what to take.  She says she is run out of her Toprol.  PMHx:  Past Medical History:  Diagnosis Date   Arthritis    knees and left hip   Atrophic gastritis without mention of hemorrhage    Breast cancer ([place])    Bronchitis, not specified as acute or chronic    Cancer ([place])    Lung cancer   Cardiac dysrhythmia, unspecified    Cataract    Chronic pancreatitis ([facility])    Complication of anesthesia    Cough    Esophageal reflux    Family history of malignant neoplasm of gastrointestinal tract    Fatty liver    Flatulence, eructation, and gas pain    Gastric erosions    [place]   Hiatal hernia    Hoarseness    since virus 2 months agp   Irritable bowel syndrome    Irritable bowel syndrome    LGSIL (low grade squamous intraepithelial dysplasia) 11/15/2014   with biopsy of  benign vaginal cyst   Malignant neoplasm of breast (female), unspecified site    Skin Cancer legs, face, shoulder, some basal some squamous   Other chest pain    Personal history of radiation therapy    Pneumonia, organism unspecified([place])    PONV (postoperative nausea and vomiting)    with hemorrhoid surgery  years ago, no n/v with breast surgery   Pure hypercholesterolemia    Shortness of breath    with Exertion   Sorethroat    since virus 2 months ago   Symptomatic menopausal or female climacteric states    Thoracic aortic aneurysm without rupture 08/29/12   08/29/12 discovered on PET/CT   Tubular adenoma of colon    Unspecified essential hypertension    Urinary tract infection, site not specified     Past Surgical History:  Procedure Laterality Date   BREAST LUMPECTOMY     BREAST SURGERY     CATARACT EXTRACTION     Bilateral   COLONOSCOPY     EYE SURGERY     HEMORROIDECTOMY     SKIN CANCER EXCISION     VIDEO ASSISTED THORACOSCOPY (VATS)/WEDGE RESECTION Left 09/19/2012   Procedure: LEFT VIDEO ASSISTED THORACOSCOPY (VATS)/LEFT UPPER LOBE WEDGE RESECTION & NODE DISSECTION;  Surgeon: Haley Nakayama, MD;  Location: [facility] OR;  Service: Thoracic;  Laterality: Left;    FAMHx:  Family History  Problem Relation Age of Onset   Heart disease Mother    Colon cancer Father 51   Cancer Father    Stroke Father    Heart disease Brother  Cancer Brother        mass behind lung-died age 71   Multiple myeloma Brother 24       died age 66   Liver cancer Brother    Diabetes Paternal Aunt    Pancreatic cancer Neg Hx    Stomach cancer Neg Hx    Esophageal cancer Neg Hx     SOCHx:   reports that she has never smoked. She has never used smokeless tobacco. She reports that she does not drink alcohol and does not use drugs.  ALLERGIES:  No Known Allergies  ROS: Pertinent items noted in HPI and remainder of comprehensive ROS otherwise negative.  HOME MEDS: Current Outpatient Medications on File Prior to Visit  Medication Sig Dispense Refill   atorvastatin (LIPITOR) 40 MG tablet TAKE 1 TABLET EVERY DAY 90 tablet 2   b complex vitamins tablet Take 1 tablet by mouth daily.     Biotin 10000 MCG TABS Take 1 tablet by mouth daily.     co-enzyme Q-10 30 MG capsule Take 300 mg by mouth daily.      losartan (COZAAR) 50 MG tablet TAKE 1 TABLET BY MOUTH EVERY MORNING AND 1/2 TABLET EVERY EVENING. 135 tablet 3   metoprolol succinate (TOPROL-XL) 25 MG 24 hr tablet Take 0.5 tablets (12.5 mg total) by mouth daily as needed. 15 tablet 3   Omega-3 1000 MG CAPS Take by mouth.     Probiotic Product (PROBIOTIC DAILY PO) Take by mouth.     verapamil (CALAN-SR) 120 MG CR tablet TAKE 1 TABLET (120 MG TOTAL) BY MOUTH DAILY. 90 tablet 3   Vitamin D, Cholecalciferol, 50 MCG (2000 UT) CAPS Take 1 capsule by mouth daily.     No current facility-administered medications on file prior to visit.    LABS/IMAGING: No results found for this or any previous visit (from the past 48 hour(s)). No results found.  LIPID PANEL:    Component Value Date/Time   CHOL 155 06/02/2018 1041   TRIG 84 06/02/2018 1041   HDL 83 06/02/2018 1041   CHOLHDL 1.9 06/02/2018 1041   CHOLHDL 1.9 09/05/2015 0854   VLDL 19 09/05/2015 0854   LDLCALC 55 06/02/2018 1041     WEIGHTS: Wt Readings from Last 3 Encounters:  07/25/21 135 lb (61.2 kg)  04/08/21 132 lb 6 oz (60 kg)  03/25/21 133 lb (60.3 kg)    VITALS: BP 132/80    Pulse (!) 51    Ht '5\' 6"'  (1.676 m)    Wt 135 lb (61.2 kg)    SpO2 99%    BMI 21.79 kg/m   EXAM: General appearance: alert and no distress Lungs: clear to auscultation bilaterally Heart: regular rate and rhythm, S1, S2 normal, no murmur, click, rub or gallop Extremities: extremities normal, atraumatic, no cyanosis or edema Neurologic: Grossly normal Psych: Pleasant  EKG: Sinus bradycardia first-degree AV block at 51, PACs- personally reviewed  ASSESSMENT: Breakthrough palpitations History of SVT  PLAN: 1.   [name] has a history of SVT and has had breakthrough palpitations.  She is on verapamil and has Toprol-XL for breakthrough.  She ran out of that recently but notes that it has not been too effective for her.  She took some of her husband's medicine.  She is noted to have some PACs  today.  We discussed this medicine and the fact that it is slow onset and longer duration.  It would be more preferable to use a shorter acting quick onset  medicine such as metoprolol tartrate for which she can adjust the dose as needed.  I will prescribe her that, namely metoprolol tartrate 25 mg for which she could take half to a full tablet every 12 hours as needed.  She should continue on her verapamil.  Ultimately if she continues to have issues with breakthrough palpitations and has bradycardia that would not allow additional daily medication, she may need to be evaluated for ablation or antiarrhythmic therapy.  She has a follow-up appointment scheduled with Dr. Claiborne [name] in February which I have encouraged her to keep.  Pixie Casino, MD, [facility], [place] Director of the Advanced Lipid Disorders &  Cardiovascular Risk Reduction Clinic Diplomate of the American Board of Clinical Lipidology Attending Cardiologist  Direct Dial: [phone]   Fax: [phone]  Website:  [email or web address] 07/25/2021, 4:44 PM

## 2021-08-22 ENCOUNTER — Other Ambulatory Visit: Payer: Self-pay | Admitting: Family Medicine

## 2021-08-22 DIAGNOSIS — Z1231 Encounter for screening mammogram for malignant neoplasm of breast: Secondary | ICD-10-CM

## 2021-08-27 ENCOUNTER — Ambulatory Visit: Payer: Medicare PPO | Admitting: Cardiovascular Disease

## 2021-08-27 ENCOUNTER — Encounter: Payer: Self-pay | Admitting: Cardiovascular Disease

## 2021-08-27 ENCOUNTER — Other Ambulatory Visit: Payer: Self-pay

## 2021-08-27 DIAGNOSIS — I7121 Aneurysm of the ascending aorta, without rupture: Secondary | ICD-10-CM | POA: Diagnosis not present

## 2021-08-27 DIAGNOSIS — I10 Essential (primary) hypertension: Secondary | ICD-10-CM | POA: Diagnosis not present

## 2021-08-27 DIAGNOSIS — R002 Palpitations: Secondary | ICD-10-CM | POA: Diagnosis not present

## 2021-08-27 DIAGNOSIS — E785 Hyperlipidemia, unspecified: Secondary | ICD-10-CM

## 2021-08-27 DIAGNOSIS — I471 Supraventricular tachycardia: Secondary | ICD-10-CM

## 2021-08-27 NOTE — Patient Instructions (Signed)
Medication Instructions:  Your physician recommends that you continue on your current medications as directed. Please refer to the Current Medication list given to you today.  *If you need a refill on your cardiac medications before your next appointment, please call your pharmacy*  Follow-Up: At [facility], you and your health needs are our priority.  As part of our continuing mission to provide you with exceptional heart care, we have created designated Provider Care Teams.  These Care Teams include your primary Cardiologist (physician) and Advanced Practice Providers (APPs -  Physician Assistants and Nurse Practitioners) who all work together to provide you with the care you need, when you need it.  We recommend signing up for the patient portal called "MyChart".  Sign up information is provided on this After Visit Summary.  MyChart is used to connect with patients for Virtual Visits (Telemedicine).  Patients are able to view lab/test results, encounter notes, upcoming appointments, etc.  Non-urgent messages can be sent to your provider as well.   To learn more about what you can do with MyChart, go to [email or web address].    Your next appointment:   12 month(s)  The format for your next appointment:   In Person  Provider:   Dr. Shelva Majestic

## 2021-08-27 NOTE — Progress Notes (Signed)
Patient ID: Haley [name], female   DOB: 1933/09/06, 88 y.o.   MRN: [id number]     HPI: Haley [name] is a 88 y.o. female who presents to the office today for a 6 month cardiology evaluation.  Haley [name]  has a history of superventricular tachycardia in the past has been treated with verapamil as well as beta blocker therapy. Recently, she has done well and now only takes a beta blocker on an as needed basis and admits to only rarely taking this. She has a remote history of breast cancer. She has a history of hiatal hernia with GERD, hyperlipidemia, and also evidence for descending thoracic aorta that is increased at 4.5 cm on a CT scan suggested fusiform aneurysmal dilatation. On the initial CT in 2011 she was found to have a right upper lung nodule. 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 Erasmo [name]. She has had subsequent CT scan is for followup.  Her last CT angiogram of her chest was on 10/23/2014 which demonstrated a stable descending thoracic aortic aneurysm measuring 4.8 cm maximally.  Semiannual imaging follow-up I, CTA or MRI was recommended. She denies recent chest pain. She denies tachycardia palpitations. She denies PND or orthopnea. She denies bleeding.  Laboratory by her primary physician in February 2016: Her hemoglobin and hematocrit were stable at 12.8 and 39.4.  Chemistry profile was normal.  Chest normal renal function.  Lipids are excellent with a total cholesterol 165, triglycerides 131, HDL 67, LDL 72.  She is followed by Dr. Roxan [name] for her ascending thoracic aortic aneurysm.  I saw her in May 2018  and she  remained stable and has been without recurrent episodes of SVT.  She  saw Dr. Roxan [name] on Dec 21, 2017 and her most recent CT showed a stable a sending thoracic aneurysm at 4.7 cm in greatest diameter without evidence for dissection.  There were stable  postoperative changes after prior left lung wedge resection.  There was a stable 7 mm dense calcification in the splenic hilum likely representing a small densely calcified splenic artery aneurysm.  She continues to be on six-month follow-up CT imaging per Dr. Roxan [name].    She wore an event monitor ordered by Haley [name] from April 10 through Dec 02, 2017.  Her predominant rhythm was sinus rhythm.  There were isolated PVCs and PACs.  There were no episodes of ventricular couplets or episodes of atrial fibrillation.  I saw her in August 2019 at which time she denied any chest pain and was on she denies any episodes of chest pain.  She was been on atorvastatin 20 mg for hyperlipidemia.  In February 2019 LDL cholesterol was 86.  She is on metoprolol 12.5 mg and verapamil 120 mg for hypertension as well as her SVT.  When I saw her, I recommended further titration of atorvastatin to 40 mg.  Over the past 3 months, she has continued to do well.  She underwent follow-up laboratory 2 weeks ago and LDL cholesterol was now at 55.    I saw her in the office in 2019 at which time she continued to do well.  She underwent follow-up laboratory 2 weeks ago and LDL cholesterol was now at 55.  She underwent a repeat CT Angio of her chest on December 3 and saw Dr. Roxan [name] in follow-up. At that time she had a stable ascending thoracic aneurysm  measuring 4.7 cm.  She recently underwent a follow-up 25-monthevaluation with CT imaging on Dec 22, 2018.   Anterior ascending thoracic aneurysm remains stable measuring 4.6 cm maximally.  She also had ascending aortic atherosclerosis.  There was no acute cardiopulmonary disease.  She is scheduled to see Dr. HRoxan Hockeyin follow-up.   She was evaluated by me in a telemedicine visit in June 2020.  She was continuing to feel well.  Her blood pressures have been running stable in the 118/70-120 2/77 range.  She denies chest pain palpitations shortness of breath.  She has GERD.   Recent laboratory by her primary physician Haley [name] March 4 had shown excellent lipid studies with total cholesterol 145, HDL 74, triglycerides 91 and LDL 53.  Total bilirubin was 1.5 and ALT and AST were normal.    Since her last evaluation, she has continued to feel well.  Earlier this year she had her first PWindomvaccination and developed chills and fever following the initial injection.  She apparently was treated with antibiotics by her primary physician.  She underwent a follow-up CT in February 2021.  Her ascending thoracic aortic aneurysm was unchanged and measured up to 4.6 cm in diameter.  He was seen by Dr. HRoxan Hockeyin follow-up evaluation.  I saw her on November 10, 2019.  At that time she continued to be stable and denied any chest pain, PND orthopnea.   She did not have a reaction to her second vaccine.  She has been on atorvastatin 40 mg daily for hyperlipidemia, and losartan 50 mg in the morning and 25 mg in the evening well as verapamil 120 mg daily both for hypertension and palpitations.  Has not had any further episodes of SVT.  [name].  Lipid studies were excellent with total cholesterol 146, HDL 68 and LDL 63.  Chemistries were normal although total bilirubin was minimally increased at 1.5.    I last saw her on February 28, 2021.  Prior to that evaluation she had undergone a follow-up CT angio of her chest and aorta.  There was aortic atherosclerosis and ascending aortic dilation at 4.5 cm in the proximal ascending segment, 4.3 cm in the upper ascending segment and 4.5 cm on the coronal image.  There was not significant change from prior studies and semiannual imaging was recommended.  Clinically she feels well.  She also has undergone [facility] screening in March 2022 which showed mild bilateral carotid disease.  There was a low probability of osteoporosis.  Abdominal aortic and measurement was normal and she had  normal ABIs in her lower extremities.  [name].  Creatinine was 0.83.  LDL cholesterol was 63 with total cholesterol 146 and HDL 68 with triglycerides 76.  LFTs were normal except bilirubin was minimally increased at 1.5.  During that evaluation, I reviewed her recent CT angio of her chest and aorta.  I had recommended a future follow-up echo Doppler assessment.  Since I last saw her, she was seen by Dr. HDebara Picketton July 25, 2021 after she was having some issues with palpitations and had taken some of her husband's medicine which resulted in significant bradycardia.  Presently, she feels well.  However 2 weeks ago she did notice both high and low heart rates and took metoprolol.  Presently she denies any recurrent palpitations and she is on metoprolol tartrate as needed  12.5 to 25 mg, verapamil 120 mg daily and has been taking losartan 50 mg in the morning and 25 mg in the evening.  She is on atorvastatin 40 mg daily.  She is scheduled for follow-up CT angio of her chest and aorta on September 18, 2021 prior to her follow-up evaluation with Dr. Merilynn Finland.  She denies any chest pain, presyncope or syncope.  She presents for reevaluation.  Past Medical History:  Diagnosis Date   Arthritis    knees and left hip   Atrophic gastritis without mention of hemorrhage    Breast cancer ([place])    Bronchitis, not specified as acute or chronic    Cancer ([place])    Lung cancer   Cardiac dysrhythmia, unspecified    Cataract    Chronic pancreatitis ([facility])    Complication of anesthesia    Cough    Esophageal reflux    Family history of malignant neoplasm of gastrointestinal tract    Fatty liver    Flatulence, eructation, and gas pain    Gastric erosions    [place]   Hiatal hernia    Hoarseness    since virus 2 months agp   Irritable bowel syndrome    Irritable bowel syndrome    LGSIL (low grade squamous intraepithelial dysplasia) 11/15/2014    with biopsy of  benign vaginal cyst   Malignant neoplasm of breast (female), unspecified site    Skin Cancer legs, face, shoulder, some basal some squamous   Other chest pain    Personal history of radiation therapy    Pneumonia, organism unspecified(486)    PONV (postoperative nausea and vomiting)    with hemorrhoid surgery years ago, no n/Haley with breast surgery   Pure hypercholesterolemia    Shortness of breath    with Exertion   Sorethroat    since virus 2 months ago   Symptomatic menopausal or female climacteric states    Thoracic aortic aneurysm without rupture 08/29/12   08/29/12 discovered on PET/CT   Tubular adenoma of colon    Unspecified essential hypertension    Urinary tract infection, site not specified     Past Surgical History:  Procedure Laterality Date   BREAST LUMPECTOMY     BREAST SURGERY     CATARACT EXTRACTION     Bilateral   COLONOSCOPY     EYE SURGERY     HEMORROIDECTOMY     SKIN CANCER EXCISION     VIDEO ASSISTED THORACOSCOPY (VATS)/WEDGE RESECTION Left 09/19/2012   Procedure: LEFT VIDEO ASSISTED THORACOSCOPY (VATS)/LEFT UPPER LOBE WEDGE RESECTION & NODE DISSECTION;  Surgeon: Melrose Nakayama, MD;  Location: [facility];  Service: Thoracic;  Laterality: Left;    No Known Allergies  Current Outpatient Medications  Medication Sig Dispense Refill   atorvastatin (LIPITOR) 40 MG tablet TAKE 1 TABLET EVERY DAY 90 tablet 2   b complex vitamins tablet Take 1 tablet by mouth daily.     Biotin 10000 MCG TABS Take 1 tablet by mouth daily.     co-enzyme Q-10 30 MG capsule Take 300 mg by mouth daily.     losartan (COZAAR) 50 MG tablet TAKE 1 TABLET BY MOUTH EVERY MORNING AND 1/2 TABLET EVERY EVENING. 135 tablet 3   metoprolol tartrate (LOPRESSOR) 25 MG tablet Take 1/2 to 1 tablet by mouth twice daily as needed for palpitations 90 tablet 3   Omega-3 1000 MG CAPS Take by mouth.     Probiotic Product (PROBIOTIC DAILY PO) Take by mouth.  verapamil (CALAN-SR) 120 MG  CR tablet TAKE 1 TABLET (120 MG TOTAL) BY MOUTH DAILY. 90 tablet 3   Vitamin D, Cholecalciferol, 50 MCG (2000 UT) CAPS Take 1 capsule by mouth daily.     No current facility-administered medications for this visit.    Social History   Socioeconomic History   Marital status: Married    Spouse name: Not on file   Number of children: 2   Years of education: Not on file   Highest education level: Not on file  Occupational History   Occupation: Retired    Comment: Tourist information centre manager  Tobacco Use   Smoking status: Never   Smokeless tobacco: Never  Vaping Use   Vaping Use: Never used  Substance and Sexual Activity   Alcohol use: No    Alcohol/week: 0.0 standard drinks   Drug use: No   Sexual activity: Never    Comment: 1st intercourse 39 yo-1 partner  Other Topics Concern   Not on file  Social History Narrative   Married   Retired   Careers adviser         Social Determinants of Health   Financial Resource Strain: Not on file  Food Insecurity: Not on file  Transportation Needs: Not on file  Physical Activity: Not on file  Stress: Not on file  Social Connections: Not on file  Intimate Partner Violence: Not on file    Family History  Problem Relation Age of Onset   Heart disease Mother    Colon cancer Father 69   Cancer Father    Stroke Father    Heart disease Brother    Cancer Brother        mass behind lung-died age 49   Multiple myeloma Brother 17       died age 65   Liver cancer Brother    Diabetes Paternal Aunt    Pancreatic cancer Neg Hx    Stomach cancer Neg Hx    Esophageal cancer Neg Hx     ROS General: Negative; No fevers, chills, or night sweats;  HEENT: Negative; No changes in vision or hearing, sinus congestion, difficulty swallowing Pulmonary: Negative; No cough, wheezing, shortness of breath, hemoptysis Cardiovascular: Negative; No chest pain, presyncope, syncope, palpitations; no recurrent SVT GI: Negative; No nausea, vomiting, diarrhea,  or abdominal pain GU: Negative; No dysuria, hematuria, or difficulty voiding Musculoskeletal: Negative; no myalgias, joint pain, or weakness Hematologic/Oncology: Negative; no easy bruising, bleeding Endocrine: Negative; no heat/cold intolerance; no diabetes Neuro: Negative; no changes in balance, headaches Skin: Negative; No rashes or skin lesions Psychiatric: Negative; No behavioral problems, depression Sleep: Negative; No snoring, daytime sleepiness, hypersomnolence, bruxism, restless legs, hypnogognic hallucinations, no cataplexy Other comprehensive 14 point system review is negative.   PE BP 116/74    Pulse (!) 51    Ht '5\' 6"'  (1.676 m)    Wt 133 lb 12.8 oz (60.7 kg)    SpO2 96%    BMI 21.60 kg/m    Repeat blood pressure by me was 124/70  Wt Readings from Last 3 Encounters:  08/27/21 133 lb 12.8 oz (60.7 kg)  07/25/21 135 lb (61.2 kg)  04/08/21 132 lb 6 oz (60 kg)   General: Alert, oriented, no distress.  Skin: normal turgor, no rashes, warm and dry HEENT: Normocephalic, atraumatic. Pupils equal round and reactive to light; sclera anicteric; extraocular muscles intact;  Nose without nasal septal hypertrophy Mouth/Parynx benign; Mallinpatti scale 2 Neck: No JVD, no carotid bruits; normal carotid  upstroke Lungs: clear to ausculatation and percussion; no wheezing or rales Chest wall: without tenderness to palpitation Heart: PMI not displaced, RRR, s1 s2 normal, 1/6 systolic murmur, no diastolic murmur, no rubs, gallops, thrills, or heaves Abdomen: soft, nontender; no hepatosplenomehaly, BS+; abdominal aorta nontender and not dilated by palpation. Back: no CVA tenderness Pulses 2+ Musculoskeletal: full range of motion, normal strength, no joint deformities Extremities: no clubbing cyanosis or edema, Homan's sign negative  Neurologic: grossly nonfocal; Cranial nerves grossly wnl Psychologic: Normal mood and affect   August 27, 2021 ECG (independently read by me): Sinus  bradycardia at 51, 1st degree AV block, PR 236 msec QS V1-12  February 28, 2021 ECG (independently read by me): Sinus bradycardia at 55, [place] PVCs, 1st degree AV block  April 2021 ECG (independently read by me): Sinus bradycardia at 49 bpm with sinus arrhythmia and first-degree AV block with a.  Of 02 16 ms.  Poor anterior R wave progression.  November 2019  ECG (independently read by me): Sinus bradycardia 56 bpm.  First-degree AV block.  Able to 60 ms.  No ectopy.  No significant ST changes.  August 2019 ECG (independently read by me): Sinus bradycardia 53 bpm.  First-degree AV block with a PR interval of 216 ms.  Poor R wave progression V1 through V3.  May 2018 ECG (independently read by me): Sinus bradycardia 56 bpm.  Mild sinus arrhythmia.  Poor R-wave progression V1 V2 2.  Normal intervals.  April 2016 ECG (independently read by me): Sinus bradycardia at 51 bpm.  Poor progression V1 through V3.  QTc interval 420 ms.  September 2014 ECG: Sinus rhythm 58 beats per minute. Intervals normal.  LABS: I personally reviewed  laboratory done on 09/10/2016 by Haley Ramus, M.D., done at [facility]  at [facility] 04/08/2021 06/02/2018 09/05/2015  Glucose 70 - 99 mg/dL 94 84 86  BUN 6 - 23 mg/dL '23 14 14  ' Creatinine 0.40 - 1.20 mg/dL 0.97 0.81 0.80  BUN/Creat Ratio 12 - 28 - 17 -  Sodium 135 - 145 mEq/L 137 141 140  Potassium 3.5 - 5.1 mEq/L 4.5 4.9 4.0  Chloride 96 - 112 mEq/L 102 102 106  CO2 19 - 32 mEq/L '27 25 22  ' Calcium 8.4 - 10.5 mg/dL 9.1 9.3 8.9    Hepatic Function Latest Ref [facility] 04/08/2021 06/02/2018 09/05/2015  Total Protein 6.0 - 8.3 g/dL 7.0 6.8 6.7  Albumin 3.5 - 5.2 g/dL 4.1 4.5 3.9  AST 0 - 37 U/L '17 27 17  ' ALT 0 - 35 U/L '10 15 10  ' Alk Phosphatase 39 - 117 U/L 63 72 59  Total Bilirubin 0.2 - 1.2 mg/dL 1.3(H) 1.2 1.1    CBC Latest Ref [facility] 04/08/2021 06/02/2018 09/05/2015  WBC 4.0 - 10.5 K/uL 5.9 4.6 3.8(L)  Hemoglobin 12.0 - 15.0  g/dL 12.0 12.4 12.2  Hematocrit 36.0 - 46.0 % 36.5 37.1 36.3  Platelets 150.0 - 400.0 K/uL 261.0 284 240   Lab Results  Component Value Date   TSH 4.002 07/05/2012    BNP No results found for: PROBNP  Lipid Panel     Component Value Date/Time   CHOL 155 06/02/2018 1041   TRIG 84 06/02/2018 1041   HDL 83 06/02/2018 1041   CHOLHDL 1.9 06/02/2018 1041   CHOLHDL 1.9 09/05/2015 0854   VLDL 19 09/05/2015 0854   LDLCALC 55 06/02/2018 1041     RADIOLOGY: No results  found.  IMPRESSION:  1. Essential hypertension   2. Palpitations   3. Paroxysmal SVT (supraventricular tachycardia) ([place])   4. Aneurysm of ascending aorta without rupture   5. Hyperlipidemia LDL goal <70     ASSESSMENT AND PLAN: Ms. Constantin is a young appearing 88 year old female who has a history of paroxysmal supraventricular tachycardia which has been treated with verapamil SR 120 daily.   She denies was on low-dose metoprolol succinate but due to bradycardia this ultimately was discontinued rarely takes on a as needed basis.  Several weeks ago, she noted periods of increased heart rate as well as slower heart rate.  She did take as needed metoprolol tartrate with benefit.  Presently she denies any palpitations.  There is no chest pain.  She denies any exertional dyspnea.  Her blood pressure today is stable and she continues to be on losartan 50 mg in the morning and 25 mg at night in addition to verapamil 120 mg daily.  She continues to be on atorvastatin 40 mg for hyperlipidemia.  LDL cholesterol in April 2022 was 64.  She is scheduled to see Dr. Harlene Ramus in the near future who will be obtaining repeat laboratory.  She will be undergoing follow-up CT angio of her chest to further evaluate her ascending aortic dilatation which previously was 4.5 cm and has a follow-up appointment to see Dr. Roxan [name].  She feels well and remains fairly active.  As long as she is stable I will see her in 1 year for reevaluation or  sooner as needed.    Her blood pressure today on recheck by me at 126/70 well controlled and she continues to take losartan 50 mg in addition to her verapamil.  Her ECG today shows sinus bradycardia 55 bpm.  Her heart rate has increased since her last evaluation.  She remains asymptomatic and is unaware of any palpitations.  She continues to be on atorvastatin 40 mg daily and over-the-counter fish oil for her lipids.  I reviewed her most recent CT angio of her chest and aorta and her previously documented ascending aortic dilatation is stable.  She continues to see Dr. Roxan [name].  She is status post left upper lobe wedge resection in 2014 for adenocarcinoma.  At times she may experience some very mild dizziness.  She has not had an echo in many years.  I have suggested an echo Doppler assessment.  I will contact her with the results.  As long as she remains stable I will see her in 6 months for reevaluation.  Haley Sine, MD, [facility]  09/07/2021 4:53 PM

## 2021-09-07 ENCOUNTER — Encounter: Payer: Self-pay | Admitting: Cardiovascular Disease

## 2021-09-08 ENCOUNTER — Ambulatory Visit
Admission: RE | Admit: 2021-09-08 | Discharge: 2021-09-08 | Disposition: A | Discharge status: Home / Self Care | Payer: Medicare PPO | Source: Ambulatory Visit | Attending: Family Medicine | Admitting: Family Medicine

## 2021-09-08 DIAGNOSIS — Z1231 Encounter for screening mammogram for malignant neoplasm of breast: Secondary | ICD-10-CM

## 2021-09-10 ENCOUNTER — Ambulatory Visit: Payer: Medicare PPO

## 2021-09-15 ENCOUNTER — Other Ambulatory Visit: Payer: Self-pay | Admitting: Family Medicine

## 2021-09-15 DIAGNOSIS — N6320 Unspecified lump in the left breast, unspecified quadrant: Secondary | ICD-10-CM

## 2021-09-16 ENCOUNTER — Other Ambulatory Visit: Payer: Self-pay | Admitting: Family Medicine

## 2021-09-16 DIAGNOSIS — N6320 Unspecified lump in the left breast, unspecified quadrant: Secondary | ICD-10-CM

## 2021-09-18 ENCOUNTER — Inpatient Hospital Stay: Admission: RE | Admit: 2021-09-18 | Payer: Medicare PPO | Source: Ambulatory Visit

## 2021-09-18 ENCOUNTER — Other Ambulatory Visit: Payer: Self-pay | Admitting: Family Medicine

## 2021-09-18 DIAGNOSIS — M8580 Other specified disorders of bone density and structure, unspecified site: Secondary | ICD-10-CM

## 2021-09-23 ENCOUNTER — Ambulatory Visit: Payer: Medicare PPO | Admitting: Thoracic Surgery (Cardiothoracic Vascular Surgery)

## 2021-09-26 ENCOUNTER — Ambulatory Visit
Admission: RE | Admit: 2021-09-26 | Discharge: 2021-09-26 | Disposition: A | Discharge status: Home / Self Care | Payer: Medicare PPO | Source: Ambulatory Visit | Attending: Thoracic Surgery (Cardiothoracic Vascular Surgery) | Admitting: Thoracic Surgery (Cardiothoracic Vascular Surgery)

## 2021-09-26 DIAGNOSIS — R911 Solitary pulmonary nodule: Secondary | ICD-10-CM | POA: Diagnosis not present

## 2021-09-26 DIAGNOSIS — I712 Thoracic aortic aneurysm, without rupture, unspecified: Secondary | ICD-10-CM | POA: Diagnosis not present

## 2021-09-26 DIAGNOSIS — I7121 Aneurysm of the ascending aorta, without rupture: Secondary | ICD-10-CM

## 2021-09-26 MED ORDER — IOPAMIDOL (ISOVUE-370) INJECTION 76%
75.0000 mL | Freq: Once | INTRAVENOUS | Status: AC | PRN
  Administered 2021-09-26: 75 mL via INTRAVENOUS

## 2021-10-07 ENCOUNTER — Other Ambulatory Visit: Payer: Self-pay

## 2021-10-07 ENCOUNTER — Ambulatory Visit: Payer: Medicare PPO | Admitting: Thoracic Surgery (Cardiothoracic Vascular Surgery)

## 2021-10-07 VITALS — BP 118/65 | HR 63 | Resp 20 | Ht 66.0 in | Wt 133.0 lb

## 2021-10-07 DIAGNOSIS — I7121 Aneurysm of the ascending aorta, without rupture: Secondary | ICD-10-CM | POA: Diagnosis not present

## 2021-10-07 NOTE — Progress Notes (Signed)
? ?   ?Haley [name] ?      [place] [address] ?            [phone]   ? ?HPI:Haley [name] for follow-up of her ascending aneurysm and lung nodule. ?  ?Haley [name] is an 88 year old woman with a history of stage Ia lung cancer, hypertension, hyperlipidemia, supraventricular tachycardia, breast cancer, skin cancer, chronic pancreatitis, reflux, irritable bowel syndrome, and an ascending aortic aneurysm. ? ?She had a groundglass opacity in her left upper lobe that was resected in 2014.  It turned out to be a stage Ia adenocarcinoma.  She had a 4.5 cm aneurysm noted on her CT.  She also has a groundglass opacity in the right upper lobe that has been stable over time dating back to 2014. ? ?She saw Dr. Claiborne Billings in February.  She is been having some difficulties with atrial fibrillation.  She currently is on verapamil and then using metoprolol as needed when her heart rate is greater than 100.  She is not having any chest pain, pressure, or tightness.  She is not on anticoagulation. ? ?Past Medical History:  ?Diagnosis Date  ? Arthritis   ? knees and left hip  ? Atrophic gastritis without mention of hemorrhage   ? Breast cancer ([place])   ? Bronchitis, not specified as acute or chronic   ? Cancer [facility])   ? Lung cancer  ? Cardiac dysrhythmia, unspecified   ? Cataract   ? Chronic pancreatitis ([place])   ? Complication of anesthesia   ? Cough   ? Esophageal reflux   ? Family history of malignant neoplasm of gastrointestinal tract   ? Fatty liver   ? Flatulence, eructation, and gas pain   ? Gastric erosions   ? [name]  ? Hiatal hernia   ? Hoarseness   ? since virus 2 months agp  ? Irritable bowel syndrome   ? Irritable bowel syndrome   ? LGSIL (low grade squamous intraepithelial dysplasia) 11/15/2014  ? with biopsy of  benign vaginal cyst  ? Malignant neoplasm of breast (female), unspecified site   ? Skin Cancer legs, face, shoulder, some basal some squamous  ? Other chest pain   ? Personal history of  radiation therapy   ? Pneumonia, organism unspecified(486)   ? PONV (postoperative nausea and vomiting)   ? with hemorrhoid surgery years ago, no n/v with breast surgery  ? Pure hypercholesterolemia   ? Shortness of breath   ? with Exertion  ? Sorethroat   ? since virus 2 months ago  ? Symptomatic menopausal or female climacteric states   ? Thoracic aortic aneurysm without rupture 08/29/12  ? 08/29/12 discovered on PET/CT  ? Tubular adenoma of colon   ? Unspecified essential hypertension   ? Urinary tract infection, site not specified   ? ? ?Current Outpatient Medications  ?Medication Sig Dispense Refill  ? atorvastatin (LIPITOR) 40 MG tablet TAKE 1 TABLET EVERY DAY 90 tablet 2  ? b complex vitamins tablet Take 1 tablet by mouth daily.    ? Biotin 10000 MCG TABS Take 1 tablet by mouth daily.    ? co-enzyme Q-10 30 MG capsule Take 300 mg by mouth daily.    ? losartan (COZAAR) 50 MG tablet TAKE 1 TABLET BY MOUTH EVERY MORNING AND 1/2 TABLET EVERY EVENING. 135 tablet 3  ? metoprolol tartrate (LOPRESSOR) 25 MG tablet Take 1/2 to 1 tablet by mouth twice daily as needed for palpitations 90 tablet  3  ? Omega-3 1000 MG CAPS Take by mouth.    ? Probiotic Product (PROBIOTIC DAILY PO) Take by mouth.    ? verapamil (CALAN-SR) 120 MG CR tablet TAKE 1 TABLET (120 MG TOTAL) BY MOUTH DAILY. 90 tablet 3  ? Vitamin D, Cholecalciferol, 50 MCG (2000 UT) CAPS Take 1 capsule by mouth daily.    ? ?No current facility-administered medications for this visit.  ? ? ?Physical Exam ?BP 118/65 (BP Location: Right Arm, Patient Position: Sitting)   Pulse 63   Resp 20   Ht 5\' 6"  (1.676 m)   Wt 133 lb (60.3 kg)   SpO2 94% Comment: RA  BMI 21.43 kg/m?  ?88 year old woman in no acute distress ?Alert and oriented x3 with no focal deficits ?No carotid bruits ?Cardiac irregularly irregular ?Lungs clear bilaterally,  ?No peripheral edema ? ?Diagnostic Tests: ?CT ANGIOGRAPHY CHEST WITH CONTRAST ?  ?TECHNIQUE: ?Multidetector CT imaging of the chest was  performed using the ?standard protocol during bolus administration of intravenous ?contrast. Multiplanar CT image reconstructions and MIPs were ?obtained to evaluate the vascular anatomy. ?  ?RADIATION DOSE REDUCTION: This exam was performed according to the ?departmental dose-optimization program which includes automated ?exposure control, adjustment of the mA and/or kV according to ?patient size and/or use of iterative reconstruction technique. ?  ?CONTRAST:  88mL ISOVUE-370 IOPAMIDOL (ISOVUE-370) INJECTION 76% ?  ?COMPARISON:  02/28/2021 and multiple previous CTA chests. ?  ?FINDINGS: ?Cardiovascular: Dilated ascending thoracic aorta to 4.6 cm. No ?dissection. Aorta decreases to normal caliber across the arch. Mild ?aortic atherosclerosis. ?  ?Heart normal in overall size.  No pericardial effusion. ?  ?Mediastinum/Nodes: No neck base, mediastinal or hilar masses or ?enlarged lymph nodes. Trachea and esophagus are unremarkable. ?  ?Lungs/Pleura: Linear mixed attenuation nodule, right upper lobe, ?centered on image 26, series 8, unchanged. Subtle ?peribronchovascular opacities in the lower lobes and left upper lobe ?lingula similar to the prior CT. Stable scarring in the anterior ?right middle lobe adjacent upper lobe. Stable changes from left ?upper lobe surgery with a posterior pulmonary anastomosis staple ?line. No new lung nodules. ?  ?No pleural effusion or pneumothorax. ?  ?Upper Abdomen: No acute abnormality. ?  ?Musculoskeletal: No fracture or acute finding.  No bone lesion. ?  ?Review of the MIP images confirms the above findings. ?  ?IMPRESSION: ?1. Essentially stable descending thoracic aortic aneurysm, currently ?measuring 4.6 cm, previously measured at 4.5 cm. Ascending thoracic ?aortic aneurysm. Recommend semi-annual imaging followup by CTA or ?MRA and referral to cardiothoracic surgery if not already obtained. ?This recommendation follows 2010 ?ACCF/AHA/AATS/ACR/ASA/SCA/SCAI/SIR/STS/SVM Guidelines  for the ?Diagnosis and Management of Patients With Thoracic Aortic Disease. ?Circulation. 2010; 121: X540-G867. Aortic aneurysm NOS (ICD10-I71.9) ?2. Small mixed attenuation nodule at in the right upper lobe near ?the apex is stable and has been stable for over 5 years. This is ?consistent with chronic scarring. ?3. No acute lung findings and no significant change in the ?appearance of the lungs since prior study. ?  ?Aortic Atherosclerosis (ICD10-I70.0). ?  ?  ?Electronically Signed ?  By: Lajean Manes M.D. ?  On: 09/26/2021 13:48 ?I personally reviewed the CT images.  There is no change in the ascending aneurysm at about 4.7 cm and no change in the right upper lobe nodule. ? ?Impression: ?Haley [name] is an 88 year old woman with a history of stage Ia lung cancer, hypertension, hyperlipidemia, supraventricular tachycardia, breast cancer, skin cancer, chronic pancreatitis, reflux, irritable bowel syndrome, and an ascending aortic aneurysm. ? ?Stage  Ia adenocarcinoma left upper lobe-resected in 2014.  No evidence of recurrence. ? ?Ascending aneurysm/thoracic aortic atherosclerosis-stable at 4.7 cm.  Needs continued semiannual follow-up.  Blood pressure well controlled on current regimen. ? ?Right upper lobe lung nodule-stable over 5 years.  Continue to follow as we follow her aneurysm. ? ?Moderate aortic insufficiency-follow-up per Dr. Claiborne Billings ? ?Atrial fibrillation-medical therapy per Dr. Claiborne Billings ? ?Plan: ?Return in 6 months with CT chest to follow-up ascending aneurysm. ? ?Melrose Nakayama, MD ?[facility] ?[phone] ? ? ? ?

## 2021-10-09 ENCOUNTER — Ambulatory Visit
Admission: RE | Admit: 2021-10-09 | Discharge: 2021-10-09 | Disposition: A | Discharge status: Home / Self Care | Payer: Medicare PPO | Source: Ambulatory Visit | Attending: Family Medicine | Admitting: Family Medicine

## 2021-10-09 DIAGNOSIS — R922 Inconclusive mammogram: Secondary | ICD-10-CM | POA: Diagnosis not present

## 2021-11-18 DIAGNOSIS — I471 Supraventricular tachycardia: Secondary | ICD-10-CM | POA: Diagnosis not present

## 2021-11-18 DIAGNOSIS — Z0000 Encounter for general adult medical examination without abnormal findings: Secondary | ICD-10-CM | POA: Diagnosis not present

## 2021-11-18 DIAGNOSIS — I10 Essential (primary) hypertension: Secondary | ICD-10-CM | POA: Diagnosis not present

## 2021-11-18 DIAGNOSIS — Z853 Personal history of malignant neoplasm of breast: Secondary | ICD-10-CM | POA: Diagnosis not present

## 2021-11-18 DIAGNOSIS — Z85118 Personal history of other malignant neoplasm of bronchus and lung: Secondary | ICD-10-CM | POA: Diagnosis not present

## 2021-11-18 DIAGNOSIS — E7800 Pure hypercholesterolemia, unspecified: Secondary | ICD-10-CM | POA: Diagnosis not present

## 2021-11-22 ENCOUNTER — Other Ambulatory Visit: Payer: Self-pay | Admitting: Cardiovascular Disease

## 2022-02-04 ENCOUNTER — Other Ambulatory Visit: Payer: Self-pay | Admitting: Cardiovascular Disease

## 2022-02-27 ENCOUNTER — Other Ambulatory Visit: Payer: Self-pay | Admitting: Thoracic Surgery (Cardiothoracic Vascular Surgery)

## 2022-02-27 DIAGNOSIS — I7121 Aneurysm of the ascending aorta, without rupture: Secondary | ICD-10-CM

## 2022-03-01 ENCOUNTER — Other Ambulatory Visit: Payer: Self-pay | Admitting: Cardiovascular Disease

## 2022-03-03 ENCOUNTER — Ambulatory Visit
Admission: RE | Admit: 2022-03-03 | Discharge: 2022-03-03 | Disposition: A | Discharge status: Home / Self Care | Payer: Medicare PPO | Source: Ambulatory Visit | Attending: Family Medicine | Admitting: Family Medicine

## 2022-03-03 DIAGNOSIS — M8580 Other specified disorders of bone density and structure, unspecified site: Secondary | ICD-10-CM

## 2022-03-25 ENCOUNTER — Telehealth: Payer: Self-pay | Admitting: Cardiovascular Disease

## 2022-03-25 MED ORDER — METOPROLOL TARTRATE 25 MG PO TABS: 25.0000 mg | ORAL_TABLET | Freq: Two times a day (BID) | ORAL | 3 refills | Status: DC

## 2022-03-25 NOTE — Telephone Encounter (Signed)
LMTCB

## 2022-03-25 NOTE — Telephone Encounter (Signed)
Patient c/o Palpitations:  High priority if patient c/o lightheadedness, shortness of breath, or chest pain  How long have you had palpitations/irregular HR/ Afib? Are you having the symptoms now? 2 or 3 months   Are you currently experiencing lightheadedness, SOB or CP? lightheadedness  Do you have a history of afib (atrial fibrillation) or irregular heart rhythm? Yes  Have you checked your BP or HR? (document readings if available):  HR jumping between 40 - 160 Currently 77   Are you experiencing any other symptoms? No

## 2022-03-25 NOTE — Telephone Encounter (Signed)
Follow Up:    Patient is returning Sarah's call from this morning.

## 2022-03-25 NOTE — Addendum Note (Signed)
Addended by: Betha Loa F on: 03/25/2022 12:47 PM   Modules accepted: Orders

## 2022-03-25 NOTE — Telephone Encounter (Addendum)
Patient reports that for the past 2 months, she has increasing episodes of palpitations with morning dizziness. Denies sob. Currently, P 74. Patient stated she only drinks 2 bottles of water a day. Recommended that she hydrate more. She had not been taking metoprolol tartrate as prescribed prn, but recently began to take it. She stated she has more palpitations at night and that met tart is not keeping palpitations at bay. Please advise. Spoke with Dr. Kelly. He ordered metoprolol tartrate 25mg twice daily. Patient informed of dose change and she verbalized understanding. New prescription sent to her preferred pharmacy. 

## 2022-04-06 ENCOUNTER — Telehealth: Payer: Self-pay | Admitting: Cardiovascular Disease

## 2022-04-06 NOTE — Telephone Encounter (Signed)
STAT if HR is under 50 or over 120 (normal HR is 60-100 beats per minute)  What is your heart rate? 31,32, 33,34, 35  Do you have a log of your heart rate readings (document readings)?   Do you have any other symptoms? Usually this happen when she sleep or take a nap- how low should it be? Is this too low?

## 2022-04-06 NOTE — Telephone Encounter (Signed)
Patient stated her Apple watch showed pulse during sleep ranging from 31-131 on 9/9 and on 9/10, 32-117. She denies sob, lightheadedness, or dizziness. During our call pulse showed 58. She is taking lopressor 25mg  twice daily, calan 120mg  daily, and losartan 50mg  in the AM and 25 mg in the PM. Please advise.

## 2022-04-07 ENCOUNTER — Other Ambulatory Visit: Payer: Self-pay

## 2022-04-07 MED ORDER — METOPROLOL TARTRATE 25 MG PO TABS: 12.5000 mg | ORAL_TABLET | Freq: Two times a day (BID) | ORAL | 3 refills | Status: DC

## 2022-04-07 NOTE — Telephone Encounter (Signed)
LMTCB

## 2022-04-07 NOTE — Telephone Encounter (Signed)
Recommend reducing metoprolol to 12.5mg  BID

## 2022-04-07 NOTE — Telephone Encounter (Signed)
Patient informed to take metoprolol tartrate 12.5mg  in the morning and in the evening. She stated she has enough pills to cut in half each day. She will keep a diary of BP/P.

## 2022-04-09 ENCOUNTER — Ambulatory Visit
Admission: RE | Admit: 2022-04-09 | Discharge: 2022-04-09 | Disposition: A | Discharge status: Home / Self Care | Payer: Medicare PPO | Source: Ambulatory Visit | Attending: Thoracic Surgery (Cardiothoracic Vascular Surgery) | Admitting: Thoracic Surgery (Cardiothoracic Vascular Surgery)

## 2022-04-09 DIAGNOSIS — I712 Thoracic aortic aneurysm, without rupture, unspecified: Secondary | ICD-10-CM | POA: Diagnosis not present

## 2022-04-09 DIAGNOSIS — I7121 Aneurysm of the ascending aorta, without rupture: Secondary | ICD-10-CM

## 2022-04-09 MED ORDER — IOPAMIDOL (ISOVUE-370) INJECTION 76%
60.0000 mL | Freq: Once | INTRAVENOUS | Status: AC | PRN
  Administered 2022-04-09: 60 mL via INTRAVENOUS

## 2022-04-14 ENCOUNTER — Encounter: Payer: Self-pay | Admitting: Thoracic Surgery (Cardiothoracic Vascular Surgery)

## 2022-04-14 ENCOUNTER — Ambulatory Visit: Payer: Medicare PPO | Admitting: Thoracic Surgery (Cardiothoracic Vascular Surgery)

## 2022-04-14 VITALS — BP 119/73 | HR 52 | Resp 20 | Ht 66.0 in | Wt 131.9 lb

## 2022-04-14 DIAGNOSIS — I7121 Aneurysm of the ascending aorta, without rupture: Secondary | ICD-10-CM | POA: Diagnosis not present

## 2022-04-14 DIAGNOSIS — R918 Other nonspecific abnormal finding of lung field: Secondary | ICD-10-CM

## 2022-04-14 NOTE — Progress Notes (Signed)
[address]       ,[place] [address]             [phone]     HPI: Haley [name] returns for a scheduled follow-up visit regarding groundglass opacity in the right upper lobe and an ascending aneurysm.  Haley [name] is an 88 year old woman with a history of stage Ia lung cancer, hypertension, hyperlipidemia, supraventricular tachycardia, breast cancer, skin cancer, chronic pancreatitis, reflux, irritable bowel syndrome, and an ascending aortic aneurysm.  I resected a stage Ia adenocarcinoma in 2014.  She was noted to have an ascending aneurysm on her CT.  She is also been followed for a groundglass opacity in the right upper lobe.  She has been having some issues with bradycardia.  She has not been able to get into see Dr. Claiborne Billings.  She spoke to her nurse and some medication changes were made.  However she notes her heart rate still dropping down below 40 at night.  Otherwise she feels well.  Past Medical History:  Diagnosis Date   Arthritis    knees and left hip   Atrophic gastritis without mention of hemorrhage    Breast cancer ([place])    Bronchitis, not specified as acute or chronic    Cancer ([place])    Lung cancer   Cardiac dysrhythmia, unspecified    Cataract    Chronic pancreatitis ([facility])    Complication of anesthesia    Cough    Esophageal reflux    Family history of malignant neoplasm of gastrointestinal tract    Fatty liver    Flatulence, eructation, and gas pain    Gastric erosions    [place]   Hiatal hernia    Hoarseness    since virus 2 months agp   Irritable bowel syndrome    Irritable bowel syndrome    LGSIL (low grade squamous intraepithelial dysplasia) 11/15/2014   with biopsy of  benign vaginal cyst   Malignant neoplasm of breast (female), unspecified site    Skin Cancer legs, face, shoulder, some basal some squamous   Other chest pain    Personal history of radiation therapy    Pneumonia, organism unspecified(486)    PONV  (postoperative nausea and vomiting)    with hemorrhoid surgery years ago, no n/v with breast surgery   Pure hypercholesterolemia    Shortness of breath    with Exertion   Sorethroat    since virus 2 months ago   Symptomatic menopausal or female climacteric states    Thoracic aortic aneurysm without rupture ([place]) 08/29/12   08/29/12 discovered on PET/CT   Tubular adenoma of colon    Unspecified essential hypertension    Urinary tract infection, site not specified     Current Outpatient Medications  Medication Sig Dispense Refill   atorvastatin (LIPITOR) 40 MG tablet TAKE 1 TABLET EVERY DAY 90 tablet 3   b complex vitamins tablet Take 1 tablet by mouth daily.     Biotin 10000 MCG TABS Take 1 tablet by mouth daily.     co-enzyme Q-10 30 MG capsule Take 300 mg by mouth daily.     losartan (COZAAR) 50 MG tablet TAKE 1 TABLET BY MOUTH EVERY MORNING AND 1/2 TABLET EVERY EVENING. 135 tablet 3   metoprolol tartrate (LOPRESSOR) 25 MG tablet Take 0.5 tablets (12.5 mg total) by mouth 2 (two) times daily. 60 tablet 3   Omega-3 1000 MG CAPS Take by mouth.     Probiotic Product (  PROBIOTIC DAILY PO) Take by mouth.     verapamil (CALAN-SR) 120 MG CR tablet TAKE 1 TABLET EVERY DAY 90 tablet 3   Vitamin D, Cholecalciferol, 50 MCG (2000 UT) CAPS Take 1 capsule by mouth daily.     No current facility-administered medications for this visit.    Physical Exam BP 119/73 (BP Location: Left Arm, Patient Position: Sitting, Cuff Size: Normal)   Pulse (!) 52   Resp 20   Ht 5\' 6"  (1.676 m)   Wt 131 lb 14.4 oz (59.8 kg)   SpO2 97% Comment: RA  BMI 21.78 kg/m  88 year old woman in no acute distress Alert and oriented x3 with no focal deficits Lungs clear bilaterally Cardiac bradycardic, regular No peripheral edema  Diagnostic Tests: CT ANGIOGRAPHY CHEST WITH CONTRAST   TECHNIQUE: Multidetector CT imaging of the chest was performed using the standard protocol during bolus administration of  intravenous contrast. Multiplanar CT image reconstructions and MIPs were obtained to evaluate the vascular anatomy.   RADIATION DOSE REDUCTION: This exam was performed according to the departmental dose-optimization program which includes automated exposure control, adjustment of the mA and/or kV according to patient size and/or use of iterative reconstruction technique.   CONTRAST:  22mL ISOVUE-370 IOPAMIDOL (ISOVUE-370) INJECTION 76%   COMPARISON:  Prior chest CTA is, most recently dense 09/26/2021 and 03/20/2021.   FINDINGS: Cardiovascular: Stable mild dilatation of the ascending aorta which has a maximal diameter of 4.6 cm, similar to the prior study. No displaced intimal calcifications are identified. The aortic arch and descending aorta are normal in caliber. Mild underlying atherosclerosis of the aorta, great vessels and coronary arteries. No calcified aortic valve leaflets are identified. The heart size is normal. There is no pericardial effusion.   Mediastinum/Nodes: There are no enlarged mediastinal, hilar or axillary lymph nodes. The thyroid gland, trachea and esophagus demonstrate no significant findings.   Lungs/Pleura: No pleural effusion or pneumothorax. Mild centrilobular and paraseptal emphysema with subpleural reticulation anteriorly in the right lung attributed to prior radiation therapy. There are stable postsurgical changes from left upper lobe wedge resection. No new or enlarging pulmonary nodules are identified. A chronic ground-glass opacity medially at the right apex is unchanged, measuring approximately 1.9 x 0.9 cm on image 25/6. Chronic tree in bud nodularity of both lung bases appears unchanged.   Upper abdomen: The visualized upper abdomen appears stable without suspicious findings.   Musculoskeletal/Chest wall: There is no chest wall mass or suspicious osseous finding.   Review of the MIP images confirms the above findings.   IMPRESSION: 1.  Stable chest CTA without acute findings. 2. Stable aneurysmal dilatation of the ascending aorta measuring up to 4.6 cm in diameter. Consider continued CT surveillance. 3. Stable appearance of the lungs with postsurgical changes in the left upper lobe and scattered chronic ground-glass and clustered nodules, consistent with benign findings. No evidence of local recurrence of lung cancer or metastatic disease.     Electronically Signed   By: Richardean Sale M.D.   On: 04/09/2022 11:46 I personally reviewed the CT images and concur with the findings noted above  Impression: Marcianne Ozbun is an 88 year old woman with a history of stage Ia lung cancer, hypertension, hyperlipidemia, supraventricular tachycardia, breast cancer, skin cancer, chronic pancreatitis, reflux, irritable bowel syndrome, and an ascending aortic aneurysm.  Ascending aneurysm-stable at 4.5- 4.6 cm.  Needs continued semiannual follow-up.  Hypertension-blood pressure well controlled.  Stage Ia adenocarcinoma left upper lobe-now 9 years out from surgery with no evidence of  recurrent disease.  She does have some stable groundglass opacities that have been stable for many years now.  Bradycardia-history of tachybradycardia syndrome.  Has been having issues with her heart rate dropping into the high 30s when she sleeps at night according to her Apple Watch.  She is trying to arrange an appointment with cardiologist.  Plan:  Follow-up with cardiology regarding heart rate. Return in 6 months with CT angio of chest  Melrose Nakayama, MD [facility] [phone]

## 2022-05-12 ENCOUNTER — Telehealth: Payer: Self-pay | Admitting: Cardiovascular Disease

## 2022-05-12 MED ORDER — METOPROLOL TARTRATE 25 MG PO TABS: 12.5000 mg | ORAL_TABLET | Freq: Two times a day (BID) | ORAL | 3 refills | Status: DC

## 2022-05-12 NOTE — Telephone Encounter (Signed)
Pt c/o medication issue:  1. Name of Medication:  metoprolol tartrate (LOPRESSOR) 25 MG tablet  2. How are you currently taking this medication (dosage and times per day)?  As prescribed, 0.5 tablets (12.5 mg total) by mouth 2 (two) times daily  3. Are you having a reaction (difficulty breathing--STAT)?   4. What is your medication issue?   Patient would like to know if she needs to continue taking this medication.

## 2022-05-12 NOTE — Telephone Encounter (Signed)
Patient stated that her Apple watch will wake her at night when her heart rate drops to the 30s. She stated this also occurred before she was put on metoprolol tartrate. She is asymptomatic when she is wakened (at different times during hours of sleep). She said she has not been in afib since 04/01/22. She wanted to know if she should stay on the dose of met tart 12.64m twice daily. Please advise.

## 2022-05-13 DIAGNOSIS — H43392 Other vitreous opacities, left eye: Secondary | ICD-10-CM | POA: Diagnosis not present

## 2022-05-14 ENCOUNTER — Telehealth: Payer: Self-pay | Admitting: Cardiovascular Disease

## 2022-05-14 NOTE — Telephone Encounter (Signed)
Pt is requesting a provider switch. Pt would like to be notified once approved. Please advise

## 2022-05-22 NOTE — Telephone Encounter (Signed)
Pt calling back for an update

## 2022-05-25 DIAGNOSIS — Z85828 Personal history of other malignant neoplasm of skin: Secondary | ICD-10-CM | POA: Diagnosis not present

## 2022-05-25 DIAGNOSIS — D2262 Melanocytic nevi of left upper limb, including shoulder: Secondary | ICD-10-CM | POA: Diagnosis not present

## 2022-05-25 DIAGNOSIS — L821 Other seborrheic keratosis: Secondary | ICD-10-CM | POA: Diagnosis not present

## 2022-05-25 DIAGNOSIS — D225 Melanocytic nevi of trunk: Secondary | ICD-10-CM | POA: Diagnosis not present

## 2022-05-25 DIAGNOSIS — D2261 Melanocytic nevi of right upper limb, including shoulder: Secondary | ICD-10-CM | POA: Diagnosis not present

## 2022-05-25 DIAGNOSIS — L720 Epidermal cyst: Secondary | ICD-10-CM | POA: Diagnosis not present

## 2022-05-25 DIAGNOSIS — L570 Actinic keratosis: Secondary | ICD-10-CM | POA: Diagnosis not present

## 2022-05-25 DIAGNOSIS — D224 Melanocytic nevi of scalp and neck: Secondary | ICD-10-CM | POA: Diagnosis not present

## 2022-05-25 DIAGNOSIS — D1801 Hemangioma of skin and subcutaneous tissue: Secondary | ICD-10-CM | POA: Diagnosis not present

## 2022-06-04 NOTE — Telephone Encounter (Signed)
ok 

## 2022-06-15 ENCOUNTER — Other Ambulatory Visit (INDEPENDENT_AMBULATORY_CARE_PROVIDER_SITE_OTHER): Payer: Medicare PPO

## 2022-06-15 ENCOUNTER — Encounter (HOSPITAL_BASED_OUTPATIENT_CLINIC_OR_DEPARTMENT_OTHER): Payer: Self-pay

## 2022-06-15 ENCOUNTER — Telehealth (HOSPITAL_BASED_OUTPATIENT_CLINIC_OR_DEPARTMENT_OTHER): Payer: Self-pay

## 2022-06-15 ENCOUNTER — Encounter (HOSPITAL_BASED_OUTPATIENT_CLINIC_OR_DEPARTMENT_OTHER): Payer: Self-pay | Admitting: Cardiovascular Disease

## 2022-06-15 ENCOUNTER — Ambulatory Visit (HOSPITAL_BASED_OUTPATIENT_CLINIC_OR_DEPARTMENT_OTHER): Payer: Medicare PPO | Admitting: Cardiovascular Disease

## 2022-06-15 VITALS — BP 128/78 | HR 44 | Ht 66.0 in | Wt 132.3 lb

## 2022-06-15 DIAGNOSIS — E785 Hyperlipidemia, unspecified: Secondary | ICD-10-CM | POA: Diagnosis not present

## 2022-06-15 DIAGNOSIS — I10 Essential (primary) hypertension: Secondary | ICD-10-CM | POA: Diagnosis not present

## 2022-06-15 DIAGNOSIS — I7121 Aneurysm of the ascending aorta, without rupture: Secondary | ICD-10-CM

## 2022-06-15 DIAGNOSIS — I471 Supraventricular tachycardia, unspecified: Secondary | ICD-10-CM

## 2022-06-15 NOTE — Progress Notes (Signed)
Cardiology Office Note   Date:  06/15/2022   ID:  Jaliza, Seifried 1934/06/24, MRN [id number]  PCP:  Aretta Nip, MD  Cardiologist:   Skeet Latch, MD   No chief complaint on file.   History of Present Illness: KRISTE BROMAN is a 88 y.o. female with hypertension, hyperlipidemia, SVT, aortic atherosclerosis, descending thoracic aortic aneurysm, splenic artery aneurysm, lung cancer status post wedge resection, remote breast cancer, hiatal hernia, and GERD here for follow-up.  She has been followed by Dr. Claiborne Billings for SVT and is now here to be at a closer office.  In the past she has been treated with verapamil and beta-blockers.  She wore a monitor in 2019 that revealed isolated PACs and PVCs with no significant arrhythmias.  She saw Dr. Debara Pickett for an urgent visit 06/2021 after experiencing more palpitations.  She had taken some of her husbands medication which resulted in bradycardia.  She last saw Dr. Claiborne Billings 08/2021 and was taking her beta-blocker only on an as-needed basis.  She rarely needed to use it.  She did report both high and low heart rates and was using metoprolol only on an as-needed basis.  She is taking verapamil daily.  She had an echo 02/2021 that revealed LVEF 60 to 65% with indeterminate diastolic function.  She had mild aortic valve sclerosis without stenosis.    Ms. Markman presents today reporting increased palpitations.  She called the office and was told to start taking 12.5 mg of metoprolol every 12 hours, which she believes may be bringing her heart rate too low, causing her to feel more tired.  Prior to this, she was taking it as needed. She has experienced episodes of her heart going fast and pounding, but she has not felt them as much recently. She had a mild episode two nights ago, which did not last long. In the past, these episodes lasted longer, and she would use deep breathing techniques to get her heart back into rhythm. She denies any chest pain or  pressure, but reports having indigestion, which she is prone to. Her breathing has been okay, and she has not experienced any swelling in her legs or shortness of breath when laying in bed. She denies any recent passing out episodes.  She has a history of a 4.5 cm descending thoracic aortic aneurysm.  CT of the chest 03/2022 revealed a stable 4.6 cm aneurysm of the ascending aorta.    Past Medical History:  Diagnosis Date   Arthritis    knees and left hip   Atrophic gastritis without mention of hemorrhage    Breast cancer ([place])    Bronchitis, not specified as acute or chronic    Cancer ([place])    Lung cancer   Cardiac dysrhythmia, unspecified    Cataract    Chronic pancreatitis ([facility])    Complication of anesthesia    Cough    Esophageal reflux    Family history of malignant neoplasm of gastrointestinal tract    Fatty liver    Flatulence, eructation, and gas pain    Gastric erosions    [place]   Hiatal hernia    Hoarseness    since virus 2 months agp   Irritable bowel syndrome    Irritable bowel syndrome    LGSIL (low grade squamous intraepithelial dysplasia) 11/15/2014   with biopsy of  benign vaginal cyst   Malignant neoplasm of breast (female), unspecified site    Skin Cancer legs, face, shoulder, some  basal some squamous   Other chest pain    Personal history of radiation therapy    Pneumonia, organism unspecified(486)    PONV (postoperative nausea and vomiting)    with hemorrhoid surgery years ago, no n/v with breast surgery   Pure hypercholesterolemia    Shortness of breath    with Exertion   Sorethroat    since virus 2 months ago   Symptomatic menopausal or female climacteric states    Thoracic aortic aneurysm without rupture ([place]) 08/29/12   08/29/12 discovered on PET/CT   Tubular adenoma of colon    Unspecified essential hypertension    Urinary tract infection, site not specified     Past Surgical History:  Procedure Laterality Date   BREAST LUMPECTOMY Right  03/23/2003   w/ radiation. no chemo.   BREAST SURGERY     CATARACT EXTRACTION     Bilateral   COLONOSCOPY     EYE SURGERY     HEMORROIDECTOMY     SKIN CANCER EXCISION     VIDEO ASSISTED THORACOSCOPY (VATS)/WEDGE RESECTION Left 09/19/2012   Procedure: LEFT VIDEO ASSISTED THORACOSCOPY (VATS)/LEFT UPPER LOBE WEDGE RESECTION & NODE DISSECTION;  Surgeon: Melrose Nakayama, MD;  Location: [facility];  Service: Thoracic;  Laterality: Left;     Current Outpatient Medications  Medication Sig Dispense Refill   atorvastatin (LIPITOR) 40 MG tablet TAKE 1 TABLET EVERY DAY 90 tablet 3   b complex vitamins tablet Take 1 tablet by mouth daily.     Biotin 10000 MCG TABS Take 1 tablet by mouth daily.     co-enzyme Q-10 30 MG capsule Take 300 mg by mouth daily.     losartan (COZAAR) 50 MG tablet TAKE 1 TABLET BY MOUTH EVERY MORNING AND 1/2 TABLET EVERY EVENING. 135 tablet 3   Omega-3 1000 MG CAPS Take by mouth.     Probiotic Product (PROBIOTIC DAILY PO) Take by mouth.     verapamil (CALAN-SR) 120 MG CR tablet TAKE 1 TABLET EVERY DAY 90 tablet 3   Vitamin D, Cholecalciferol, 50 MCG (2000 UT) CAPS Take 1 capsule by mouth daily.     No current facility-administered medications for this visit.    Allergies:   Patient has no known allergies.    Social History:  The patient  reports that she has never smoked. She has never used smokeless tobacco. She reports that she does not drink alcohol and does not use drugs.   Family History:  The patient's family history includes Cancer in her brother and father; Colon cancer (age of onset: 41) in her father; Diabetes in her paternal aunt; Heart disease in her brother and mother; Liver cancer in her brother; Multiple myeloma (age of onset: 8) in her brother; Stroke in her father.    ROS:  Please see the history of present illness.   Otherwise, review of systems are positive for none.   All other systems are reviewed and negative.    PHYSICAL EXAM: VS:  BP 128/78  (BP Location: Left Arm, Patient Position: Sitting, Cuff Size: Normal)   Pulse (!) 44   Ht _0  (1.676 m)   Wt 132 lb 4.8 oz (60 kg)   BMI 21.35 kg/m  , BMI Body mass index is 21.35 kg/m. GENERAL:  Well appearing HEENT:  Pupils equal round and reactive, fundi not visualized, oral mucosa unremarkable NECK:  No jugular venous distention, waveform within normal limits, carotid upstroke brisk and symmetric, no bruits, no thyromegaly LUNGS:  Clear to  auscultation bilaterally HEART:  Bradycardic. Regular rhythm.  PMI not displaced or sustained,S1 and S2 within normal limits, no S3, no S4, no clicks, no rubs, no murmurs ABD:  Flat, positive bowel sounds normal in frequency in pitch, no bruits, no rebound, no guarding, no midline pulsatile mass, no hepatomegaly, no splenomegaly EXT:  2 plus pulses throughout, no edema, no cyanosis no clubbing SKIN:  No rashes no nodules NEURO:  Cranial nerves II through XII grossly intact, motor grossly intact throughout PSYCH:  Cognitively intact, oriented to person place and time  EKG:  EKG is ordered today. The ekg ordered today demonstrates sinus bradycardia.  Rate 44 bpm.   Recent Labs: No results found for requested labs within last 365 days.    Lipid Panel    Component Value Date/Time   CHOL 155 06/02/2018 1041   TRIG 84 06/02/2018 1041   HDL 83 06/02/2018 1041   CHOLHDL 1.9 06/02/2018 1041   CHOLHDL 1.9 09/05/2015 0854   VLDL 19 09/05/2015 0854   LDLCALC 55 06/02/2018 1041      Wt Readings from Last 3 Encounters:  06/15/22 132 lb 4.8 oz (60 kg)  04/14/22 131 lb 14.4 oz (59.8 kg)  10/07/21 133 lb (60.3 kg)      ASSESSMENT AND PLAN:  Essential hypertension Blood pressure is well-controlled.  Continue losartan and verapamil.  She will stop the metoprolol due to bradycardia.   Paroxysmal SVT (supraventricular tachycardia) Patient has a history of SVT.  Her Apple watch shows labile heart rates.  It has been concerning for atrial  fibrillation, but she has no ECGs of these events.  We will have her wear a 7-day monitor to better assess.  Check a CBC, CMP, and magnesium.  We will also get a TSH.  Given her bradycardia we will have her stop the metoprolol and just continue with the verapamil.  Hyperlipidemia LDL goal <70 Continue atorvastatin.  She will come back for fasting lipids and a CMP.  Ascending aortic aneurysm ([place]) She had both ascending and descending aortic aneurysms and has been very stable.  She has been getting imaging every 6 months.    Current medicines are reviewed at length with the patient today.  The patient does not have concerns regarding medicines.  The following changes have been made:  stop metoprolol  Labs/ tests ordered today include:   Orders Placed This Encounter  Procedures   Lipid panel   Comp Met (CMET)   CBC   Magnesium   TSH   LONG TERM MONITOR (3-14 DAYS)   EKG 12-Lead     Disposition:   FU with Roshawn Ayala C. Oval Linsey, MD, [facility] in 1 month     Signed, Stamatia Masri C. Oval Linsey, MD, [facility]  06/15/2022 5:20 PM    [facility]

## 2022-06-15 NOTE — Telephone Encounter (Signed)
Attempted to contact pt to clarify fasting is required for lab work in AM.  Messages left on patient's cell phone number and son's cell phone.  Also, message sent via My Chart. Attempted patients home phone as well, no identifying pt information so no message left [place] RN CCM

## 2022-06-15 NOTE — Patient Instructions (Addendum)
Medication Instructions:  Discontinue Metoprolol  *If you need a refill on your cardiac medications before your next appointment, please call your pharmacy*   Lab Work: Lipid, CMP, CBC, Magnesium, TSH tomorrow AM (06/16/2022)  If you have labs (blood work) drawn today and your tests are completely normal, you will receive your results only by: [place] (if you have MyChart) OR A paper copy in the mail If you have any lab test that is abnormal or we need to change your treatment, we will call you to review the results.   Testing/Procedures: None   Follow-Up: At [facility], you and your health needs are our priority.  As part of our continuing mission to provide you with exceptional heart care, we have created designated Provider Care Teams.  These Care Teams include your primary Cardiologist (physician) and Advanced Practice Providers (APPs -  Physician Assistants and Nurse Practitioners) who all work together to provide you with the care you need, when you need it.  We recommend signing up for the patient portal called "MyChart".  Sign up information is provided on this After Visit Summary.  MyChart is used to connect with patients for Virtual Visits (Telemedicine).  Patients are able to view lab/test results, encounter notes, upcoming appointments, etc.  Non-urgent messages can be sent to your provider as well.   To learn more about what you can do with MyChart, go to [email or web address].    Your next appointment:   1 month(s)  The format for your next appointment:   In Person  Provider:   Skeet Latch, MD   Other Instructions Wear ZIO, 7 Day monitor

## 2022-06-15 NOTE — Assessment & Plan Note (Signed)
Continue atorvastatin.  She will come back for fasting lipids and a CMP.

## 2022-06-15 NOTE — Assessment & Plan Note (Signed)
Blood pressure is well-controlled.  Continue losartan and verapamil.  She will stop the metoprolol due to bradycardia.

## 2022-06-15 NOTE — Assessment & Plan Note (Signed)
She had both ascending and descending aortic aneurysms and has been very stable.  She has been getting imaging every 6 months.

## 2022-06-15 NOTE — Assessment & Plan Note (Addendum)
Patient has a history of SVT.  Her Apple watch shows labile heart rates.  It has been concerning for atrial fibrillation, but she has no ECGs of these events.  We will have her wear a 7-day monitor to better assess.  Check a CBC, CMP, and magnesium.  We will also get a TSH.  Given her bradycardia we will have her stop the metoprolol and just continue with the verapamil.

## 2022-06-16 DIAGNOSIS — I471 Supraventricular tachycardia, unspecified: Secondary | ICD-10-CM | POA: Diagnosis not present

## 2022-06-16 DIAGNOSIS — I10 Essential (primary) hypertension: Secondary | ICD-10-CM | POA: Diagnosis not present

## 2022-06-17 LAB — MAGNESIUM: Magnesium: 1.8 mg/dL (ref 1.6–2.3)

## 2022-06-17 LAB — LIPID PANEL
Chol/HDL Ratio: 2 ratio (ref 0.0–4.4)
Cholesterol, Total: 156 mg/dL (ref 100–199)
HDL: 77 mg/dL (ref >39)
LDL Chol Calc (NIH): 63 mg/dL (ref 0–99)
Triglycerides: 88 mg/dL (ref 0–149)
VLDL Cholesterol Cal: 16 mg/dL (ref 5–40)

## 2022-06-17 LAB — COMPREHENSIVE METABOLIC PANEL
ALT: 10 IU/L (ref 0–32)
AST: 23 IU/L (ref 0–40)
Albumin/Globulin Ratio: 1.7 (ref 1.2–2.2)
Albumin: 4.3 g/dL (ref 3.7–4.7)
Alkaline Phosphatase: 77 IU/L (ref 44–121)
BUN/Creatinine Ratio: 22 (ref 12–28)
BUN: 21 mg/dL (ref 8–27)
Bilirubin Total: 1.3 mg/dL — ABNORMAL HIGH (ref 0.0–1.2)
CO2: 24 mmol/L (ref 20–29)
Calcium: 9.5 mg/dL (ref 8.7–10.3)
Chloride: 104 mmol/L (ref 96–106)
Creatinine, Ser: 0.96 mg/dL (ref 0.57–1.00)
Globulin, Total: 2.5 g/dL (ref 1.5–4.5)
Glucose: 87 mg/dL (ref 70–99)
Potassium: 4.5 mmol/L (ref 3.5–5.2)
Sodium: 139 mmol/L (ref 134–144)
Total Protein: 6.8 g/dL (ref 6.0–8.5)
eGFR: 58 mL/min/{1.73_m2} — ABNORMAL LOW (ref >59)

## 2022-06-17 LAB — CBC
Hematocrit: 35.8 % (ref 34.0–46.6)
Hemoglobin: 12 g/dL (ref 11.1–15.9)
MCH: 30.8 pg (ref 26.6–33.0)
MCHC: 33.5 g/dL (ref 31.5–35.7)
MCV: 92 fL (ref 79–97)
Platelets: 240 10*3/uL (ref 150–450)
RBC: 3.89 x10E6/uL (ref 3.77–5.28)
RDW: 13.3 % (ref 11.7–15.4)
WBC: 5.1 10*3/uL (ref 3.4–10.8)

## 2022-06-17 LAB — TSH: TSH: 3.92 u[IU]/mL (ref 0.450–4.500)

## 2022-06-25 NOTE — Telephone Encounter (Signed)
Patient seen by Dr.Greenbush.  See note.  Thanks!

## 2022-07-01 DIAGNOSIS — I4719 Other supraventricular tachycardia: Secondary | ICD-10-CM | POA: Diagnosis not present

## 2022-07-02 DIAGNOSIS — I509 Heart failure, unspecified: Secondary | ICD-10-CM | POA: Diagnosis not present

## 2022-07-02 DIAGNOSIS — Z85828 Personal history of other malignant neoplasm of skin: Secondary | ICD-10-CM | POA: Diagnosis not present

## 2022-07-02 DIAGNOSIS — E785 Hyperlipidemia, unspecified: Secondary | ICD-10-CM | POA: Diagnosis not present

## 2022-07-02 DIAGNOSIS — R32 Unspecified urinary incontinence: Secondary | ICD-10-CM | POA: Diagnosis not present

## 2022-07-02 DIAGNOSIS — I471 Supraventricular tachycardia, unspecified: Secondary | ICD-10-CM | POA: Diagnosis not present

## 2022-07-02 DIAGNOSIS — I110 Hypertensive heart disease with heart failure: Secondary | ICD-10-CM | POA: Diagnosis not present

## 2022-07-02 DIAGNOSIS — Z85118 Personal history of other malignant neoplasm of bronchus and lung: Secondary | ICD-10-CM | POA: Diagnosis not present

## 2022-07-14 ENCOUNTER — Ambulatory Visit (HOSPITAL_BASED_OUTPATIENT_CLINIC_OR_DEPARTMENT_OTHER): Payer: Medicare PPO | Admitting: Family

## 2022-07-17 DIAGNOSIS — R051 Acute cough: Secondary | ICD-10-CM | POA: Diagnosis not present

## 2022-07-17 DIAGNOSIS — Z03818 Encounter for observation for suspected exposure to other biological agents ruled out: Secondary | ICD-10-CM | POA: Diagnosis not present

## 2022-07-30 ENCOUNTER — Encounter (HOSPITAL_BASED_OUTPATIENT_CLINIC_OR_DEPARTMENT_OTHER): Payer: Self-pay

## 2022-07-30 ENCOUNTER — Ambulatory Visit (HOSPITAL_BASED_OUTPATIENT_CLINIC_OR_DEPARTMENT_OTHER): Payer: Medicare PPO | Admitting: Family

## 2022-07-30 VITALS — BP 148/81 | HR 63 | Ht 66.0 in | Wt 127.2 lb

## 2022-07-30 DIAGNOSIS — R002 Palpitations: Secondary | ICD-10-CM

## 2022-07-30 DIAGNOSIS — I471 Supraventricular tachycardia, unspecified: Secondary | ICD-10-CM

## 2022-07-30 DIAGNOSIS — I10 Essential (primary) hypertension: Secondary | ICD-10-CM

## 2022-07-30 DIAGNOSIS — R052 Subacute cough: Secondary | ICD-10-CM | POA: Diagnosis not present

## 2022-07-30 MED ORDER — BENZONATATE 100 MG PO CAPS
100.0000 mg | ORAL_CAPSULE | Freq: Three times a day (TID) | ORAL | 0 refills | Status: DC | PRN
Start: 2022-07-30 — End: 2024-01-20

## 2022-07-30 NOTE — Patient Instructions (Signed)
Medication Instructions:  Your physician has recommended you make the following change in your medication:   START Tessalon as needed in the evening for cough  *If you need a refill on your cardiac medications before your next appointment, please call your pharmacy*   Lab Work: Recent lab work showed normal thyroid, kidneys, electrolytes, and liver which is great!   Testing/Procedures: Your monitor showed 1,500 episodes of SVT (supraventricular tachycardia). We could consider a medication called Amiodarone if you begin to develop more symptoms.    Follow-Up: At [facility], you and your health needs are our priority.  As part of our continuing mission to provide you with exceptional heart care, we have created designated Provider Care Teams.  These Care Teams include your primary Cardiologist (physician) and Advanced Practice Providers (APPs -  Physician Assistants and Nurse Practitioners) who all work together to provide you with the care you need, when you need it.  We recommend signing up for the patient portal called "MyChart".  Sign up information is provided on this After Visit Summary.  MyChart is used to connect with patients for Virtual Visits (Telemedicine).  Patients are able to view lab/test results, encounter notes, upcoming appointments, etc.  Non-urgent messages can be sent to your provider as well.   To learn more about what you can do with MyChart, go to [email or web address].    Your next appointment:   3 month(s)  The format for your next appointment:   In Person  Provider:   Skeet Latch, MD or Laurann Montana, NP    Other Instructions  To prevent palpitations: Make sure you are adequately hydrated.  Avoid and/or limit caffeine containing beverages like soda or tea. Exercise regularly.  Manage stress well. Some over the counter medications can cause palpitations such as Benadryl, AdvilPM, TylenolPM. Regular Advil or Tylenol do not cause  palpitations.   To prevent or reduce lower extremity swelling: Eat a low salt diet. Salt makes the body hold onto extra fluid which causes swelling. Sit with legs elevated. For example, in the recliner or on an [place].  Wear knee-high compression stockings during the daytime. Ones labeled 15-20 mmHg provide good compression.

## 2022-07-30 NOTE — Progress Notes (Signed)
Office Visit    Patient Name: Haley [name] Date of Encounter: 08/04/2022  PCP:  Aretta Nip, MD   [place]  Cardiologist:  Skeet Latch, MD  Advanced Practice Provider:  No care team member to display Electrophysiologist:  None      Chief Complaint    Haley [name] is a 89 y.o. female presents today for follow up after monitor  Past Medical History    Past Medical History:  Diagnosis Date   Arthritis    knees and left hip   Atrophic gastritis without mention of hemorrhage    Breast cancer ([place])    Bronchitis, not specified as acute or chronic    Cancer ([place])    Lung cancer   Cardiac dysrhythmia, unspecified    Cataract    Chronic pancreatitis ([place])    Complication of anesthesia    Cough    Esophageal reflux    Family history of malignant neoplasm of gastrointestinal tract    Fatty liver    Flatulence, eructation, and gas pain    Gastric erosions    [place]   Hiatal hernia    Hoarseness    since virus 2 months agp   Irritable bowel syndrome    Irritable bowel syndrome    LGSIL (low grade squamous intraepithelial dysplasia) 11/15/2014   with biopsy of  benign vaginal cyst   Malignant neoplasm of breast (female), unspecified site    Skin Cancer legs, face, shoulder, some basal some squamous   Other chest pain    Personal history of radiation therapy    Pneumonia, organism unspecified(486)    PONV (postoperative nausea and vomiting)    with hemorrhoid surgery years ago, no n/v with breast surgery   Pure hypercholesterolemia    Shortness of breath    with Exertion   Sorethroat    since virus 2 months ago   Symptomatic menopausal or female climacteric states    Thoracic aortic aneurysm without rupture ([place]) 08/29/12   08/29/12 discovered on PET/CT   Tubular adenoma of colon    Unspecified essential hypertension    Urinary tract infection, site not specified    Past Surgical History:  Procedure Laterality Date    BREAST LUMPECTOMY Right 03/23/2003   w/ radiation. no chemo.   BREAST SURGERY     CATARACT EXTRACTION     Bilateral   COLONOSCOPY     EYE SURGERY     HEMORROIDECTOMY     SKIN CANCER EXCISION     VIDEO ASSISTED THORACOSCOPY (VATS)/WEDGE RESECTION Left 09/19/2012   Procedure: LEFT VIDEO ASSISTED THORACOSCOPY (VATS)/LEFT UPPER LOBE WEDGE RESECTION & NODE DISSECTION;  Surgeon: Melrose Nakayama, MD;  Location: [facility];  Service: Thoracic;  Laterality: Left;    Allergies  No Known Allergies  History of Present Illness    Haley [name] is a 89 y.o. female with a hx of hypertension, hyperlipidemia, SVT, aortic atherosclerosis, descending thoracic aortic aneurysm, splenic artery aneurysm, lung cancer s/p wedge resection, remote breast cancer, hiatal hernia, GERD last seen 06/15/22.  Previously followed with Dr. Claiborne Billings but moved to Dr. Oval Linsey to be closer to home.  Prior monitor 2019 isolated PAC and PVCs no significant arrhythmia.  Previously taking verapamil daily and metoprolol as needed.  It was later increased to daily.  Echo 02/2021 LVEF suggested 5% with indeterminate diastolic dysfunction with mild aortic valve sclerosis that stenosis.  History of 4.5 cm descending thoracic aortic aneurysm.  CT chest 03/2022  with stable 4.6 cm not aneurysm of the ascending aorta.  Last seen 06/15/2022 by Dr. Oval Linsey.  Metoprolol discontinued due to bradycardia and monitor placed.  Preliminary monitor report with probably normal sinus rhythm with average heart rate 71 bpm.  First-degree AV block present.  1595 episodes of SVT with longest 17.9 seconds at 100 bpm.  2.4% PAC burden.  She presents today for follow up with her son. Notes since stopping Metoprolol her heart rat ehas been more steady. Had a viral infection a couple weeks ago and flu. Symptoms are improving. Still with mild residual cough. Reports no shortness of breath nor dyspnea on exertion. Reports no chest pain, pressure, or tightness. No  edema, orthopnea, PND. Reports no palpitations.    EKGs/Labs/Other Studies Reviewed:   The following studies were reviewed today:  Echo 03/17/21   1. Left ventricular ejection fraction, by estimation, is 60 to 65%. The  left ventricle has normal function. The left ventricle has no regional  wall motion abnormalities. Left ventricular diastolic parameters are  indeterminate.   2. Right ventricular systolic function is normal. The right ventricular  size is normal.   3. Mild mitral valve regurgitation.   4. The aortic valve is tricuspid. Aortic valve regurgitation is moderate.  Mild aortic valve sclerosis is present, with no evidence of aortic valve  stenosis.   5. Aortic dilatation noted. There is mild dilatation of the aortic root,  measuring 43 mm.   6. The inferior vena cava is normal in size with greater than 50%  respiratory variability, suggesting right atrial pressure of 3 mmHg.  EKG:  EKG is not ordered today.    Recent Labs: 06/16/2022: ALT 10; BUN 21; Creatinine, Ser 0.96; Hemoglobin 12.0; Magnesium 1.8; Platelets 240; Potassium 4.5; Sodium 139; TSH 3.920  Recent Lipid Panel    Component Value Date/Time   CHOL 156 06/16/2022 0947   TRIG 88 06/16/2022 0947   HDL 77 06/16/2022 0947   CHOLHDL 2.0 06/16/2022 0947   CHOLHDL 1.9 09/05/2015 0854   VLDL 19 09/05/2015 0854   LDLCALC 63 06/16/2022 0947     Home Medications   Current Meds  Medication Sig   atorvastatin (LIPITOR) 40 MG tablet TAKE 1 TABLET EVERY DAY   b complex vitamins tablet Take 1 tablet by mouth daily.   benzonatate (TESSALON PERLES) 100 MG capsule Take 1 capsule (100 mg total) by mouth 3 (three) times daily as needed for cough.   Biotin 10000 MCG TABS Take 1 tablet by mouth daily.   co-enzyme Q-10 30 MG capsule Take 300 mg by mouth daily.   losartan (COZAAR) 50 MG tablet TAKE 1 TABLET BY MOUTH EVERY MORNING AND 1/2 TABLET EVERY EVENING.   Omega-3 1000 MG CAPS Take by mouth.   Probiotic Product  (PROBIOTIC DAILY PO) Take by mouth.   verapamil (CALAN-SR) 120 MG CR tablet TAKE 1 TABLET EVERY DAY   Vitamin D, Cholecalciferol, 50 MCG (2000 UT) CAPS Take 1 capsule by mouth daily.     Review of Systems      All other systems reviewed and are otherwise negative except as noted above.  Physical Exam    VS:  BP (!) 148/81 (BP Location: Left Arm, Patient Position: Sitting, Cuff Size: Normal)   Pulse 63   Ht 5\' 6"  (1.676 m)   Wt 127 lb 3.2 oz (57.7 kg)   BMI 20.53 kg/m  , BMI Body mass index is 20.53 kg/m.  Wt Readings from Last 3 Encounters:  07/30/22  127 lb 3.2 oz (57.7 kg)  06/15/22 132 lb 4.8 oz (60 kg)  04/14/22 131 lb 14.4 oz (59.8 kg)     GEN: Well nourished, well developed, in no acute distress. HEENT: normal. Neck: Supple, no JVD, carotid bruits, or masses. Cardiac: RRR, no murmurs, rubs, or gallops. No clubbing, cyanosis, edema.  Radials/PT 2+ and equal bilaterally.  Respiratory:  Respirations regular and unlabored, clear to auscultation bilaterally. GI: Soft, nontender, nondistended. MS: No deformity or atrophy. Skin: Warm and dry, no rash. Neuro:  Strength and sensation are intact. Psych: Normal affect.  Assessment & Plan    Hypertension- BP mildly elevated in clinic but previously well controlled. Discussed to monitor BP at home at least 2 hours after medications and sitting for 5-10 minutes. Continue Verapamil. Check in via MyChart in a couple weeks regarding BP.   Paroxysmal SVT-preliminary monitor report with 1500 episodes of SVT.  She is overall unaware of these episodes and reports no tachycardia, lightheadedness, syncope.  She is hesitant regarding additional medications.  We discussed possible addition of amiodarone but she prefers to defer at this time. No heart failure symptoms.   Hyperlipidemia, LDL goal less than 70-continue atorvastatin, omega-3.  Ascending aortic aneurysm- 03/2022 stqable ascending arota 4.6 cm. continue optimal blood pressure  control.  No beta-blocker due to previous bradycardia.  Cough -Rx Tessalon Perles as needed for cough in the evening.  Further follow-up per primary care provider.    Disposition: Follow up in 3 month(s) with Skeet Latch, MD or APP.  Signed, Loel Dubonnet, NP 08/04/2022, 4:51 PM [facility]

## 2022-08-04 ENCOUNTER — Encounter (HOSPITAL_BASED_OUTPATIENT_CLINIC_OR_DEPARTMENT_OTHER): Payer: Self-pay | Admitting: Family

## 2022-08-05 ENCOUNTER — Telehealth: Payer: Self-pay

## 2022-08-05 NOTE — Telephone Encounter (Signed)
Called to discuss PREP program referral, left voicemail  

## 2022-08-06 NOTE — Telephone Encounter (Signed)
She returned my call, explained PREP, she would like to attend at [name], gave her dates/times for available classes, she will call me back with her decision.

## 2022-08-11 ENCOUNTER — Encounter (HOSPITAL_BASED_OUTPATIENT_CLINIC_OR_DEPARTMENT_OTHER): Payer: Self-pay

## 2022-08-12 ENCOUNTER — Telehealth: Payer: Self-pay

## 2022-08-12 NOTE — Telephone Encounter (Signed)
Returning her call, she would like to attend PREP at [facility] on 2/13 every T/Th 2-3:15; will contact first of February to set up assessment visit

## 2022-08-18 ENCOUNTER — Ambulatory Visit: Payer: Medicare PPO | Admitting: Cardiovascular Disease

## 2022-08-26 ENCOUNTER — Telehealth: Payer: Self-pay

## 2022-08-26 NOTE — Telephone Encounter (Signed)
Called to confirm participation in next Prep class at [facility] on 2/13; assessment visit scheduled for 2/6 at 3pm

## 2022-08-27 ENCOUNTER — Other Ambulatory Visit: Payer: Self-pay | Admitting: Thoracic Surgery (Cardiothoracic Vascular Surgery)

## 2022-08-27 DIAGNOSIS — I7121 Aneurysm of the ascending aorta, without rupture: Secondary | ICD-10-CM

## 2022-08-27 DIAGNOSIS — R918 Other nonspecific abnormal finding of lung field: Secondary | ICD-10-CM

## 2022-09-01 NOTE — Progress Notes (Signed)
[facility] PREP Evaluation  Patient Details  Name: Haley [name] MRN: [id number] Date of Birth: Dec 17, 1933 Age: 89 y.o. PCP: Aretta Nip, MD  Vitals:   09/01/22 1537  BP: 104/60  Pulse: 90  SpO2: 98%  Weight: 128 lb 3.2 oz (58.2 kg)     [facility] Eval - 09/01/22 1500       [facility] "PREP" Location   [facility] "PREP" Location [name]      Referral    Referring Provider Oval Linsey    Reason for referral High Cholesterol;Hypertension    Program Start Date 09/08/22      Measurement   Waist Circumference 33.5 inches    Hip Circumference 38.5 inches    Body fat 38.6 percent      Information for Trainer   Goals --   Establish exercise routine that is age appropriate   Current Exercise none    Pertinent Medical History --   HTN SVT, hyperlipidemia   Medications that affect exercise Beta blocker      Timed Up and Go (TUGS)   Timed Up and Go Low risk <9 seconds      Mobility and Daily Activities   I find it easy to walk up or down two or more flights of stairs. 3    I have no trouble taking out the trash. 4    I do housework such as vacuuming and dusting on my own without difficulty. 4    I can easily lift a gallon of milk (8lbs). 4    I can easily walk a mile. 4    I have no trouble reaching into high cupboards or reaching down to pick up something from the floor. 1    I do not have trouble doing out-door work such as Armed forces logistics/support/administrative officer, raking leaves, or gardening. 4      Mobility and Daily Activities   I feel younger than my age. 3    I feel independent. 4    I feel energetic. 2    I live an active life.  2    I feel strong. 3    I feel healthy. 3    I feel active as other people my age. 3      How fit and strong are you.   Fit and Strong Total Score 44            Past Medical History:  Diagnosis Date   Arthritis    knees and left hip   Atrophic gastritis without mention of hemorrhage    Breast cancer ([place])    Bronchitis, not specified as acute or chronic     Cancer ([place])    Lung cancer   Cardiac dysrhythmia, unspecified    Cataract    Chronic pancreatitis ([facility])    Complication of anesthesia    Cough    Esophageal reflux    Family history of malignant neoplasm of gastrointestinal tract    Fatty liver    Flatulence, eructation, and gas pain    Gastric erosions    [place]   Hiatal hernia    Hoarseness    since virus 2 months agp   Irritable bowel syndrome    Irritable bowel syndrome    LGSIL (low grade squamous intraepithelial dysplasia) 11/15/2014   with biopsy of  benign vaginal cyst   Malignant neoplasm of breast (female), unspecified site    Skin Cancer legs, face, shoulder, some basal some squamous   Other chest pain  Personal history of radiation therapy    Pneumonia, organism unspecified(486)    PONV (postoperative nausea and vomiting)    with hemorrhoid surgery years ago, no n/v with breast surgery   Pure hypercholesterolemia    Shortness of breath    with Exertion   Sorethroat    since virus 2 months ago   Symptomatic menopausal or female climacteric states    Thoracic aortic aneurysm without rupture ([place]) 08/29/12   08/29/12 discovered on PET/CT   Tubular adenoma of colon    Unspecified essential hypertension    Urinary tract infection, site not specified    Past Surgical History:  Procedure Laterality Date   BREAST LUMPECTOMY Right 03/23/2003   w/ radiation. no chemo.   BREAST SURGERY     CATARACT EXTRACTION     Bilateral   COLONOSCOPY     EYE SURGERY     HEMORROIDECTOMY     SKIN CANCER EXCISION     VIDEO ASSISTED THORACOSCOPY (VATS)/WEDGE RESECTION Left 09/19/2012   Procedure: LEFT VIDEO ASSISTED THORACOSCOPY (VATS)/LEFT UPPER LOBE WEDGE RESECTION & NODE DISSECTION;  Surgeon: Melrose Nakayama, MD;  Location: [facility];  Service: Thoracic;  Laterality: Left;   Social History   Tobacco Use  Smoking Status Never  Smokeless Tobacco Never  To begin PREP class at [name] 2/13 every T/Th 2-3:15  Zeric Baranowski B  Brenon Antosh 09/01/2022, 3:42 PM

## 2022-09-08 NOTE — Progress Notes (Signed)
[facility] PREP Weekly Session  Patient Details  Name: Haley [name] MRN: [id number] Date of Birth: [birth year] Age: 89 y.o. PCP: Aretta Nip, MD  There were no vitals filed for this visit.   [facility] Weekly seesion - 09/08/22 1500       [facility] "PREP" Location   [facility] "PREP" Location [name]      Weekly Session   Topic Discussed Goal setting and welcome to the program   Introductions, Review of notebook, tour of facility   Classes attended to date [place] 09/08/2022, 3:58 PM

## 2022-09-15 NOTE — Progress Notes (Signed)
[facility] PREP Weekly Session  Patient Details  Name: Haley [name] MRN: [id number] Date of Birth: 1933-11-16 Age: 89 y.o. PCP: Aretta Nip, MD  Vitals:   09/15/22 1532  Weight: 128 lb (58.1 kg)     [facility] Weekly seesion - 09/15/22 1500       [facility] "PREP" Location   [facility] "PREP[facility] Location [name]      Weekly Session   Topic Discussed Importance of resistance training;Other ways to be active   Work up to 150 min of cardio/wk; strength training 2-3 times/wk for 20-40 minutes; benefits of CV and strength training   Minutes exercised this week 30 minutes    Classes attended to date [place] 09/15/2022, 3:32 PM

## 2022-09-17 DIAGNOSIS — Z6820 Body mass index (BMI) 20.0-20.9, adult: Secondary | ICD-10-CM | POA: Diagnosis not present

## 2022-09-17 DIAGNOSIS — J302 Other seasonal allergic rhinitis: Secondary | ICD-10-CM | POA: Diagnosis not present

## 2022-09-22 NOTE — Progress Notes (Signed)
[facility] PREP Weekly Session  Patient Details  Name: Haley [name] MRN: [id number] Date of Birth: 1934/07/23 Age: 89 y.o. PCP: Aretta Nip, MD  Vitals:   09/22/22 1619  Weight: 128 lb 9.6 oz (58.3 kg)     [facility] Weekly seesion - 09/22/22 1600       [facility] "PREP" Location   [facility]PREP[facility] Location [name]      Weekly Session   Topic Discussed Healthy eating tips   Foods to reduce, foods/nutrients to increase; introduced YUKA app; eat the rainbow of colors   Minutes exercised this week 50 minutes    Classes attended to date [place] 09/22/2022, 4:19 PM

## 2022-09-29 NOTE — Progress Notes (Signed)
[facility] PREP Weekly Session  Patient Details  Name: Haley [name] MRN: [id number] Date of Birth: February 19, 1934 Age: 89 y.o. PCP: Aretta Nip, MD  Vitals:   09/29/22 1558  Weight: 128 lb 12.8 oz (58.4 kg)     [facility] Weekly seesion - 09/29/22 1500       [facility] "PREP" Location   [facility]PREP[facility] Location [name]      Weekly Session   Topic Discussed Health habits   Sugar demo   Minutes exercised this week 210 minutes    Classes attended to date [place] 09/29/2022, 3:59 PM

## 2022-10-06 NOTE — Progress Notes (Signed)
[facility] PREP Weekly Session  Patient Details  Name: Haley [name] MRN: [id number] Date of Birth: [birth year] Age: 89 y.o. PCP: Aretta Nip, MD  Vitals:   10/06/22 1528  Weight: 129 lb 6.4 oz (58.7 kg)     [facility] Weekly seesion - 10/06/22 1500       [facility] "PREP" Location   [facility]PREP[facility] Location [name]      Weekly Session   Topic Discussed Restaurant Eating   salt demo   Minutes exercised this week 200 minutes    Classes attended to date [place] 10/06/2022, 3:29 PM

## 2022-10-09 ENCOUNTER — Ambulatory Visit
Admission: RE | Admit: 2022-10-09 | Discharge: 2022-10-09 | Disposition: A | Discharge status: Home / Self Care | Payer: Medicare PPO | Source: Ambulatory Visit | Attending: Thoracic Surgery (Cardiothoracic Vascular Surgery) | Admitting: Thoracic Surgery (Cardiothoracic Vascular Surgery)

## 2022-10-09 DIAGNOSIS — C3490 Malignant neoplasm of unspecified part of unspecified bronchus or lung: Secondary | ICD-10-CM | POA: Diagnosis not present

## 2022-10-09 DIAGNOSIS — R911 Solitary pulmonary nodule: Secondary | ICD-10-CM | POA: Diagnosis not present

## 2022-10-09 DIAGNOSIS — I700 Atherosclerosis of aorta: Secondary | ICD-10-CM | POA: Diagnosis not present

## 2022-10-09 DIAGNOSIS — R918 Other nonspecific abnormal finding of lung field: Secondary | ICD-10-CM

## 2022-10-09 DIAGNOSIS — I7121 Aneurysm of the ascending aorta, without rupture: Secondary | ICD-10-CM

## 2022-10-13 ENCOUNTER — Ambulatory Visit: Payer: Medicare PPO | Admitting: Thoracic Surgery (Cardiothoracic Vascular Surgery)

## 2022-10-13 NOTE — Progress Notes (Signed)
[facility] PREP Weekly Session  Patient Details  Name: Haley [name] MRN: [id number] Date of Birth: October 27, 1933 Age: 89 y.o. PCP: Aretta Nip, MD  Vitals:   10/13/22 1521  Weight: 128 lb 9.6 oz (58.3 kg)     [facility] Weekly seesion - 10/13/22 1500       [facility] "PREP" Location   [facility] "PREP" Location [name]      Weekly Session   Topic Discussed Stress management and problem solving   Better Sleep Guidelines, finger tip mudra breathwork   Minutes exercised this week 250 minutes    Classes attended to date [place] 10/13/2022, 3:22 PM

## 2022-10-20 NOTE — Progress Notes (Signed)
[facility] PREP Weekly Session  Patient Details  Name: Haley [name] MRN: [id number] Date of Birth: 04/03/1934 Age: 89 y.o. PCP: Aretta Nip, MD  Vitals:   10/20/22 1528  Weight: 128 lb 9.6 oz (58.3 kg)     [facility] Weekly seesion - 10/20/22 1500       [facility] "PREP" Location   [facility] "PREP" Location [name]      Weekly Session   Topic Discussed Expectations and non-scale victories   Halfway through program review, revisit refocus on goals; staying postive   Minutes exercised this week 215 minutes    Classes attended to date [place] 10/20/2022, 3:29 PM

## 2022-10-28 NOTE — Progress Notes (Signed)
[facility] PREP Weekly Session  Patient Details  Name: Haley [name] MRN: [id number] Date of Birth: [birth year] Age: 89 y.o. PCP: Aretta Nip, MD  Vitals:   10/27/22 1400  Weight: 128 lb 3.2 oz (58.2 kg)     [facility] Weekly seesion - 10/28/22 0800       [facility] "PREP" Location   [facility] "PREP" Location [name]      Weekly Session   Topic Discussed Other   Portion Control; Visualize your portion size demo; Red sugar [place] label review   Minutes exercised this week 125 minutes    Classes attended to date [place] 10/28/2022, 8:37 AM

## 2022-11-03 ENCOUNTER — Encounter: Payer: Self-pay | Admitting: Thoracic Surgery (Cardiothoracic Vascular Surgery)

## 2022-11-03 ENCOUNTER — Ambulatory Visit (INDEPENDENT_AMBULATORY_CARE_PROVIDER_SITE_OTHER): Payer: Medicare PPO | Admitting: Thoracic Surgery (Cardiothoracic Vascular Surgery)

## 2022-11-03 VITALS — BP 161/90 | HR 62 | Resp 18 | Ht 66.0 in | Wt 126.0 lb

## 2022-11-03 DIAGNOSIS — I7121 Aneurysm of the ascending aorta, without rupture: Secondary | ICD-10-CM | POA: Diagnosis not present

## 2022-11-03 NOTE — Progress Notes (Signed)
[facility] PREP Weekly Session  Patient Details  Name: Haley [name] MRN: [id number] Date of Birth: 1933-10-24 Age: 89 y.o. PCP: Clayborn Heron, MD  Vitals:   11/03/22 1528  Weight: 127 lb 9.6 oz (57.9 kg)     [facility] Weekly seesion - 11/03/22 1500       [facility] "PREP" Location   [facility] "PREP" Location [name]      Weekly Session   Topic Discussed Finding support   Review of food labels brought in from home   Minutes exercised this week 230 minutes    Classes attended to date 68             Jatoya Armbrister B Han [place] 11/03/2022, 3:29 PM

## 2022-11-03 NOTE — Progress Notes (Signed)
[address]       Haley [name],[place] [id number]             [phone]     HPI: Haley [name] returns for follow-up of her ascending aneurysm and groundglass opacity in the right upper lobe.   Haley [name] is an 89 year old woman with a history of stage Ia lung cancer resected in 2014, hypertension, hyperlipidemia, ascending aortic aneurysm, supraventricular tachycardia, breast cancer, skin cancer, chronic pancreatitis, reflux, and irritable bowel syndrome.  She was found to have an ascending aneurysm back in 2014 around the time she had a stage I adenocarcinoma resected.  Dating back to at least 2020 she is also had a groundglass opacity in the right upper lobe that has remained stable over time.  I last saw her in September 2023.  She was having issues with bradycardia at that time.  She saw Dr. Duke [name] and her metoprolol was stopped.  Recently she has been participating in a [employer] health sponsored exercise program through the [facility].   Past Medical History:  Diagnosis Date   Arthritis    knees and left hip   Atrophic gastritis without mention of hemorrhage    Breast cancer    Bronchitis, not specified as acute or chronic    Cancer    Lung cancer   Cardiac dysrhythmia, unspecified    Cataract    Chronic pancreatitis    Complication of anesthesia    Cough    Esophageal reflux    Family history of malignant neoplasm of gastrointestinal tract    Fatty liver    Flatulence, eructation, and gas pain    Gastric erosions    [place]   Hiatal hernia    Hoarseness    since virus 2 months agp   Irritable bowel syndrome    Irritable bowel syndrome    LGSIL (low grade squamous intraepithelial dysplasia) 11/15/2014   with biopsy of  benign vaginal cyst   Malignant neoplasm of breast (female), unspecified site    Skin Cancer legs, face, shoulder, some basal some squamous   Other chest pain    Personal history of radiation therapy    Pneumonia, organism  unspecified(486)    PONV (postoperative nausea and vomiting)    with hemorrhoid surgery years ago, no n/v with breast surgery   Pure hypercholesterolemia    Shortness of breath    with Exertion   Sorethroat    since virus 2 months ago   Symptomatic menopausal or female climacteric states    Thoracic aortic aneurysm without rupture 08/29/12   08/29/12 discovered on PET/CT   Tubular adenoma of colon    Unspecified essential hypertension    Urinary tract infection, site not specified      Current Outpatient Medications  Medication Sig Dispense Refill   atorvastatin (LIPITOR) 40 MG tablet TAKE 1 TABLET EVERY DAY 90 tablet 3   b complex vitamins tablet Take 1 tablet by mouth daily.     benzonatate (TESSALON PERLES) 100 MG capsule Take 1 capsule (100 mg total) by mouth 3 (three) times daily as needed for cough. 20 capsule 0   Biotin 9147810000 MCG TABS Take 1 tablet by mouth daily.     co-enzyme Q-10 30 MG capsule Take 300 mg by mouth daily.     losartan (COZAAR) 50 MG tablet TAKE 1 TABLET BY MOUTH EVERY MORNING AND 1/2 TABLET EVERY EVENING. 135 tablet 3   Omega-3 1000 MG CAPS Take by mouth.  Probiotic Product (PROBIOTIC DAILY PO) Take by mouth.     verapamil (CALAN-SR) 120 MG CR tablet TAKE 1 TABLET EVERY DAY 90 tablet 3   Vitamin D, Cholecalciferol, 50 MCG (2000 UT) CAPS Take 1 capsule by mouth daily.     No current facility-administered medications for this visit.    Physical Exam BP (!) 161/90 (BP Location: Left Arm, Patient Position: Sitting)   Pulse 62   Resp 18   Ht 5\' 6"  (1.676 m)   Wt 126 lb (57.2 kg)   SpO2 97% Comment: RA  BMI 20.91 kg/m  89 year old [name] in no acute distress Alert and oriented x 3 with no focal deficits Lungs clear bilaterally Cardiac regular rate and rhythm no murmur  Diagnostic Tests: CT CHEST WITHOUT CONTRAST   TECHNIQUE: Multidetector CT imaging of the chest was performed following the standard protocol without IV contrast.    RADIATION DOSE REDUCTION: This exam was performed according to the departmental dose-optimization program which includes automated exposure control, adjustment of the mA and/or kV according to patient size and/or use of iterative reconstruction technique.   COMPARISON:  04/09/2022   FINDINGS: Cardiovascular: Aortic atherosclerosis. Tortuous descending thoracic aorta. Ascending aortic aneurysm measures 4.7 cm on 81/2 and is similar to on the prior exam (when remeasured). Based off coronal reformats, 4.7 cm on image 54 today versus 4.5 cm on the prior. Normal transverse and descending thoracic aortic caliber.   Aortic atherosclerosis. Mild cardiomegaly with three-vessel coronary artery calcification. Aortic valve calcification.   Mediastinum/Nodes: No mediastinal or hilar adenopathy, given limitations of unenhanced CT.   Lungs/Pleura: No pleural fluid.  Left upper lobe wedge resection.   Right apical ground-glass nodule is similar at 2.0 x 0.9 cm on 26/5.   Multifocal areas of mucoid impaction and volume loss are identified, most significant in the anteromedial right middle lobe and anterolateral left lower lobe, similar.   Bilateral lower lobe "tree-in-bud" nodularity is mild and not significantly changed.   Upper Abdomen: Normal imaged portions of the liver, spleen, stomach, pancreas, adrenal glands, kidneys.   Musculoskeletal: Mild convex right thoracic spine curvature   IMPRESSION: 1. No change in ascending aortic dilatation at maximally 4.7 cm. Ascending thoracic aortic aneurysm. Recommend semi-annual imaging followup by CTA or MRA and referral to cardiothoracic surgery if not already obtained. This recommendation follows 2010 ACCF/AHA/AATS/ACR/ASA/SCA/SCAI/SIR/STS/SVM Guidelines for the Diagnosis and Management of Patients With Thoracic Aortic Disease. Circulation. 2010; 121: A768-T157. Aortic aneurysm NOS (ICD10-I71.9) 2. Status post left upper lobe wedge resection.  No evidence of recurrent or metastatic disease. 3. Similar right apical ground-glass nodule. 4. Postinfectious/inflammatory areas of mucoid impaction and tree-in-bud nodularity, similar. 5. Coronary artery atherosclerosis. Aortic Atherosclerosis (ICD10-I70.0).     Electronically Signed   By: Jeronimo Greaves M.D.   On: 10/09/2022 14:53 I personally reviewed the CT images.  Stable 4.7 cm ascending aneurysm.  Stable groundglass nodule right apex.  Coronary and aortic atherosclerosis.  Impression: Haley [name] is an 89 year old woman with a history of stage Ia lung cancer resected in 2014, hypertension, hyperlipidemia, ascending aortic aneurysm, supraventricular tachycardia, breast cancer, skin cancer, chronic pancreatitis, reflux, and irritable bowel syndrome.  Ascending aneurysm-stable at 4.7 cm.  Needs continued semiannual follow-up.  Importance of blood pressure control was emphasized.  Hypertension-blood pressure elevated on today's exam.  Currently on losartan and verapamil.  She has not been checking her blood pressure at home.  I advised her to do so on a regular basis.  If she notices consistent values  above 140 medication adjustments will need to be made.  Right upper lobe groundglass opacity-stable with minimal if any change dating back to 2020.  Will continue to follow with CT  Plan: Follow-up with Dr. Duke Salvia in May as scheduled Return in 6 months with CT chest  Loreli Slot, MD [facility] [phone]

## 2022-11-05 DIAGNOSIS — N611 Abscess of the breast and nipple: Secondary | ICD-10-CM | POA: Diagnosis not present

## 2022-11-10 ENCOUNTER — Telehealth: Payer: Self-pay | Admitting: Cardiovascular Disease

## 2022-11-10 MED ORDER — VERAPAMIL HCL ER 180 MG PO TBCR: 180.0000 mg | EXTENDED_RELEASE_TABLET | Freq: Every day | ORAL | 3 refills | Status: DC

## 2022-11-10 NOTE — Progress Notes (Signed)
[facility] PREP Weekly Session  Patient Details  Name: Haley [name] MRN: [id number] Date of Birth: 1933/11/21 Age: 89 y.o. PCP: Clayborn Heron, MD  Vitals:   11/10/22 1508  Weight: 126 lb 6.4 oz (57.3 kg)     [facility] Weekly seesion - 11/10/22 1500       [facility] "PREP" Location   [facility] "PREP" Location [name]      Weekly Session   Topic Discussed Calorie breakdown   carbs, fats and proteins guidelines; difference between simple and complex carbs   Minutes exercised this week 195 minutes    Classes attended to date 76             Brayla Pat B Carl Butner 11/10/2022, 3:09 PM

## 2022-11-10 NOTE — Telephone Encounter (Signed)
STAT if HR is under 50 or over 120 (normal HR is 60-100 beats per minute)  What is your heart rate? Today BP 113/61 - HR 59  Do you have a log of your heart rate readings (document readings)? 2:47 AM HR 128   Do you have any other symptoms? Patient stated that they went walking yesterday around 4 PM. Patient stated that after their walk they felt hot, tired, and didn't feel good. Patient stated last night her phone had notified her that her heart rate was irregular. Patient stated that last night at 2:47 AM HR 128, but her phone notified her that her heart rate ranged from 144-45 last night. Patient is not taking any medication to help bring her heart at a steady rate. Please advise.

## 2022-11-10 NOTE — Telephone Encounter (Signed)
As having more episodes of tachycardia, recommend increase Verapamil to 180mg  QD. This will help her heart to relax and hopefully prevent tachycardias (fast heart rates).   Likely she had an episode of SVT - which was previously noted on heart monitor.  Alver Sorrow, NP

## 2022-11-10 NOTE — Telephone Encounter (Signed)
Returned call to patient,     She states she went talking yesterday and when she got finished she felt a bit weak and had to sit down for a while. Last night she checked her pulse and it was high, so she wore it to bed last night since it had alerted her. Throughout the night she got a several alerts of high heart. She was taken off a medication recently and hasn't had any problems. Today BP was 113/59. She thinks she still has gotten alerts today. She cannot feel it only the watch is alerting. She denies any symptoms when it is high. She endorses being well hydrated.   Per last office note amiodarone discussed but patient preferred not to start at that time.

## 2022-11-10 NOTE — Addendum Note (Signed)
Addended by: Marlene Lard on: 11/10/2022 03:35 PM   Modules accepted: Orders

## 2022-11-10 NOTE — Telephone Encounter (Signed)
Patient returned call to the office, reviewed the following recommendations. Patient is agreeable to change and states she has a good back log of the 120mg  tablets. She requests that we do not send the new script at this time she will let us know when she needs more.        "As having more episodes of tachycardia, recommend increase Verapamil to 180mg  QD. This will help her heart to relax and hopefully prevent tachycardias (fast heart rates).    Likely she had an episode of SVT - which was previously noted on heart monitor.   Alver Sorrow, NP"

## 2022-11-10 NOTE — Telephone Encounter (Signed)
Left message for patient to call back    "As having more episodes of tachycardia, recommend increase Verapamil to 180mg  QD. This will help her heart to relax and hopefully prevent tachycardias (fast heart rates).    Likely she had an episode of SVT - which was previously noted on heart monitor.   Alver Sorrow, NP"

## 2022-11-12 DIAGNOSIS — H40053 Ocular hypertension, bilateral: Secondary | ICD-10-CM | POA: Diagnosis not present

## 2022-11-17 NOTE — Progress Notes (Signed)
[facility] PREP Weekly Session  Patient Details  Name: Haley [name] MRN: [id number] Date of Birth: [birth year] Age: 89 y.o. PCP: Clayborn Heron, MD  Vitals:   11/17/22 1555  Weight: 124 lb 12.8 oz (56.6 kg)     [facility] Weekly seesion - 11/17/22 1500       [facility] "PREP" Location   [facility] "PREP" Location [name]      Weekly Session   Topic Discussed Hitting roadblocks   Membership talk w/Nick, [employer] broker   Minutes exercised this week 145 minutes    Classes attended to date 21             [name] 11/17/2022, 3:56 PM

## 2022-11-26 ENCOUNTER — Encounter (HOSPITAL_BASED_OUTPATIENT_CLINIC_OR_DEPARTMENT_OTHER): Payer: Self-pay | Admitting: Cardiovascular Disease

## 2022-11-26 ENCOUNTER — Ambulatory Visit (HOSPITAL_BASED_OUTPATIENT_CLINIC_OR_DEPARTMENT_OTHER): Payer: Medicare PPO | Admitting: Cardiovascular Disease

## 2022-11-26 VITALS — BP 106/70 | HR 64 | Ht 66.0 in | Wt 123.8 lb

## 2022-11-26 DIAGNOSIS — I7121 Aneurysm of the ascending aorta, without rupture: Secondary | ICD-10-CM | POA: Diagnosis not present

## 2022-11-26 DIAGNOSIS — I471 Supraventricular tachycardia, unspecified: Secondary | ICD-10-CM | POA: Diagnosis not present

## 2022-11-26 DIAGNOSIS — E785 Hyperlipidemia, unspecified: Secondary | ICD-10-CM | POA: Diagnosis not present

## 2022-11-26 DIAGNOSIS — I10 Essential (primary) hypertension: Secondary | ICD-10-CM | POA: Diagnosis not present

## 2022-11-26 NOTE — Progress Notes (Signed)
[facility] PREP Evaluation  Patient Details  Name: Haley [name] MRN: [id number] Date of Birth: [birth year] Age: 89 y.o. PCP: Haley Heron, MD  Vitals:   11/26/22 1525  BP: 118/74  Pulse: 92  SpO2: 99%  Weight: 125 lb 6.4 oz (56.9 kg)     [facility] Eval - 11/26/22 1500       [facility] "PREP" Location   [facility] "PREP" Location [name]      Referral    Program End Date 11/26/22      Measurement   Waist Circumference 33.5 inches    Waist Circumference End Program 32 inches    Hip Circumference 38.5 inches    Hip Circumference End Program 37 inches      Mobility and Daily Activities   I find it easy to walk up or down two or more flights of stairs. 4    I have no trouble taking out the trash. 4    I do housework such as vacuuming and dusting on my own without difficulty. 4    I can easily lift a gallon of milk (8lbs). 4    I can easily walk a mile. 4    I have no trouble reaching into high cupboards or reaching down to pick up something from the floor. 4    I do not have trouble doing out-door work such as Loss adjuster, chartered, raking leaves, or gardening. 1      Mobility and Daily Activities   I feel younger than my age. 4    I feel independent. 4    I feel energetic. 3    I live an active life.  3    I feel strong. 3    I feel healthy. 3    I feel active as other people my age. 4      How fit and strong are you.   Fit and Strong Total Score 49            Past Medical History:  Diagnosis Date   Arthritis    knees and left hip   Atrophic gastritis without mention of hemorrhage    Breast cancer (HCC)    Bronchitis, not specified as acute or chronic    Cancer (HCC)    Lung cancer   Cardiac dysrhythmia, unspecified    Cataract    Chronic pancreatitis (HCC)    Complication of anesthesia    Cough    Esophageal reflux    Family history of malignant neoplasm of gastrointestinal tract    Fatty liver    Flatulence, eructation, and gas pain    Gastric erosions     Cameron   Hiatal hernia    Hoarseness    since virus 2 months agp   Irritable bowel syndrome    Irritable bowel syndrome    LGSIL (low grade squamous intraepithelial dysplasia) 11/15/2014   with biopsy of  benign vaginal cyst   Malignant neoplasm of breast (female), unspecified site    Skin Cancer legs, face, shoulder, some basal some squamous   Other chest pain    Personal history of radiation therapy    Pneumonia, organism unspecified(486)    PONV (postoperative nausea and vomiting)    with hemorrhoid surgery years ago, no n/v with breast surgery   Pure hypercholesterolemia    Shortness of breath    with Exertion   Sorethroat    since virus 2 months ago   Symptomatic menopausal or female climacteric states  Thoracic aortic aneurysm without rupture [facility]) 08/29/12   08/29/12 discovered on PET/CT   Tubular adenoma of colon    Unspecified essential hypertension    Urinary tract infection, site not specified    Past Surgical History:  Procedure Laterality Date   BREAST LUMPECTOMY Right 03/23/2003   w/ radiation. no chemo.   BREAST SURGERY     CATARACT EXTRACTION     Bilateral   COLONOSCOPY     EYE SURGERY     HEMORROIDECTOMY     SKIN CANCER EXCISION     VIDEO ASSISTED THORACOSCOPY (VATS)/WEDGE RESECTION Left 09/19/2012   Procedure: LEFT VIDEO ASSISTED THORACOSCOPY (VATS)/LEFT UPPER LOBE WEDGE RESECTION & NODE DISSECTION;  Surgeon: Loreli Slot, MD;  Location: [facility] OR;  Service: Thoracic;  Laterality: Left;   Social History   Tobacco Use  Smoking Status Never  Smokeless Tobacco Never  Wt loss: 2.8 pounds Inches lost: 3 How fit and strong survey:  09/01/22: 44 11/26/22: 49 Education sessions completed: 11 Workout sessions completed: 12  Haley [name] B Haley [name] 11/26/2022, 3:26 PM

## 2022-11-26 NOTE — Assessment & Plan Note (Signed)
Symptoms better controlled since increasing verapamil.  Continue current dose.

## 2022-11-26 NOTE — Assessment & Plan Note (Addendum)
Stable on imaging 10/2022.  She follows with Dr. Dorris Fetch.  Blood pressure well-controlled.

## 2022-11-26 NOTE — Assessment & Plan Note (Signed)
Lipids are very well-controlled on atorvastatin.  Keep up the exercise.

## 2022-11-26 NOTE — Assessment & Plan Note (Signed)
Blood pressure well-controlled on verapamil, losartan.  She was encouraged to keep up her exercise now that the PREP program has ended.

## 2022-11-26 NOTE — Patient Instructions (Signed)
Medication Instructions:  Your physician recommends that you continue on your current medications as directed. Please refer to the Current Medication list given to you today.   *If you need a refill on your cardiac medications before your next appointment, please call your pharmacy*  Lab Work: NONE   Testing/Procedures: NONE  Follow-Up: At [facility], you and your health needs are our priority.  As part of our continuing mission to provide you with exceptional heart care, we have created designated Provider Care Teams.  These Care Teams include your primary Cardiologist (physician) and Advanced Practice Providers (APPs -  Physician Assistants and Nurse Practitioners) who all work together to provide you with the care you need, when you need it.  We recommend signing up for the patient portal called "MyChart".  Sign up information is provided on this After Visit Summary.  MyChart is used to connect with patients for Virtual Visits (Telemedicine).  Patients are able to view lab/test results, encounter notes, upcoming appointments, etc.  Non-urgent messages can be sent to your provider as well.   To learn more about what you can do with MyChart, go to [email or web address].    Your next appointment:   6 month(s)  Provider:   Caitlin Walker, NP   1 YEAR WITH DR Merrill   

## 2022-11-26 NOTE — Progress Notes (Signed)
Cardiology Office Note   Date:  11/26/2022   ID:  Haley, [name] 1933-09-22, MRN [id number]  PCP:  Haley Heron, MD  Cardiologist:   Haley Si, MD   No chief complaint on file.   History of Present Illness: Haley [name] is a 89 y.o. female with hypertension, hyperlipidemia, SVT, aortic atherosclerosis, descending thoracic aortic aneurysm, splenic artery aneurysm, lung cancer status post wedge resection, remote breast cancer, hiatal hernia, and GERD here for follow-up.  She has been followed by Dr. Tresa [name] for SVT and is now here to be at a closer office.  In the past she has been treated with verapamil and beta-blockers.  She wore a monitor in 2019 that revealed isolated PACs and PVCs with no significant arrhythmias.  She saw Dr. Rennis [name] for an urgent visit 06/2021 after experiencing more palpitations.  She had taken some of her husbands medication which resulted in bradycardia.  She last saw Dr. Tresa [name] 08/2021 and was taking her beta-blocker only on an as-needed basis.  She rarely needed to use it.  She did report both high and low heart rates and was using metoprolol only on an as-needed basis.  She is taking verapamil daily.  She had an echo 02/2021 that revealed LVEF 60 to 65% with indeterminate diastolic function.  She had mild aortic valve sclerosis without stenosis.  She has a history of a 4.5 cm descending thoracic aortic aneurysm.  CT of the chest 03/2022 revealed a stable 4.6 cm aneurysm of the ascending aorta.    At her visit 05/2022 she reported increased palpitations.  She started metoprolol 12.5 mg every 12 hours but thought that her heart rate was getting too low.  She was feeling more tired.  Based on her Apple Watch she was concern for atrial fibrillation but no atrial fibrillation was noted.  She wore a 7-day ZIO that showed frequent episodes of SVT.  The longest lasted 18 seconds.  Maximum heart rate was 218 bpm.  She had rare PVCs.  Her minimum heart rate was 39  bpm.  She followed up with Haley Shields, NP 07/2022 and she was not interested in starting any antiarrhythmic medications.  Blood pressure was slightly elevated at home and she was asked to track it.  Haley [name] reports an overall improvement in her condition, with a decrease in episodes of heart racing. She has been participating in the [employer] program with Haley [name] and plans to continue attending twice a week. She has a history of yoga practice prior to the pandemic. Haley [name] denies any chest pain, pressure, or breathing difficulties during exercise.  She has been monitoring her blood pressure at home and reports no significant elevations.  Haley [name] inquires about potential interactions between her medications and supplements, but denies any side effects or symptoms related to her current medications. She is taking CoQ10 and wonders if it is necessary, as she does not experience muscle aches from her statin medication.  The patient reports occasional leg cramps upon waking up in the morning, which resolve after standing up and moving around. She is unsure of the cause but states that she is well-hydrated. Haley [name] aneurysm has been stable under the care of Haley [name].   Past Medical History:  Diagnosis Date   Arthritis    knees and left hip   Atrophic gastritis without mention of hemorrhage    Breast cancer (HCC)    Bronchitis, not specified as acute or chronic  Cancer (HCC)    Lung cancer   Cardiac dysrhythmia, unspecified    Cataract    Chronic pancreatitis (HCC)    Complication of anesthesia    Cough    Esophageal reflux    Family history of malignant neoplasm of gastrointestinal tract    Fatty liver    Flatulence, eructation, and gas pain    Gastric erosions    Cameron   Hiatal hernia    Hoarseness    since virus 2 months agp   Irritable bowel syndrome    Irritable bowel syndrome    LGSIL (low grade squamous intraepithelial dysplasia) 11/15/2014   with biopsy of  benign  vaginal cyst   Malignant neoplasm of breast (female), unspecified site    Skin Cancer legs, face, shoulder, some basal some squamous   Other chest pain    Personal history of radiation therapy    Pneumonia, organism unspecified(486)    PONV (postoperative nausea and vomiting)    with hemorrhoid surgery years ago, no n/v with breast surgery   Pure hypercholesterolemia    Shortness of breath    with Exertion   Sorethroat    since virus 2 months ago   Symptomatic menopausal or female climacteric states    Thoracic aortic aneurysm without rupture (HCC) 08/29/12   08/29/12 discovered on PET/CT   Tubular adenoma of colon    Unspecified essential hypertension    Urinary tract infection, site not specified     Past Surgical History:  Procedure Laterality Date   BREAST LUMPECTOMY Right 03/23/2003   w/ radiation. no chemo.   BREAST SURGERY     CATARACT EXTRACTION     Bilateral   COLONOSCOPY     EYE SURGERY     HEMORROIDECTOMY     SKIN CANCER EXCISION     VIDEO ASSISTED THORACOSCOPY (VATS)/WEDGE RESECTION Left 09/19/2012   Procedure: LEFT VIDEO ASSISTED THORACOSCOPY (VATS)/LEFT UPPER LOBE WEDGE RESECTION & NODE DISSECTION;  Surgeon: Loreli Slot, MD;  Location: [facility] OR;  Service: Thoracic;  Laterality: Left;     Current Outpatient Medications  Medication Sig Dispense Refill   atorvastatin (LIPITOR) 40 MG tablet TAKE 1 TABLET EVERY DAY 90 tablet 3   b complex vitamins tablet Take 1 tablet by mouth daily.     benzonatate (TESSALON PERLES) 100 MG capsule Take 1 capsule (100 mg total) by mouth 3 (three) times daily as needed for cough. 20 capsule 0   Biotin 16109 MCG TABS Take 1 tablet by mouth daily.     co-enzyme Q-10 30 MG capsule Take 300 mg by mouth daily.     losartan (COZAAR) 50 MG tablet TAKE 1 TABLET BY MOUTH EVERY MORNING AND 1/2 TABLET EVERY EVENING. 135 tablet 3   Omega-3 1000 MG CAPS Take by mouth.     Probiotic Product (PROBIOTIC DAILY PO) Take by mouth.     Turmeric  500 MG CAPS as directed Orally     verapamil (CALAN-SR) 180 MG CR tablet Take 1 tablet (180 mg total) by mouth daily. 90 tablet 3   Vitamin D, Cholecalciferol, 50 MCG (2000 UT) CAPS Take 1 capsule by mouth daily.     No current facility-administered medications for this visit.    Allergies:   Patient has no known allergies.    Social History:  The patient  reports that she has never smoked. She has never used smokeless tobacco. She reports that she does not drink alcohol and does not use drugs.   Family History:  The patient's family history includes Cancer in her brother and father; Colon cancer (age of onset: 98) in her father; Diabetes in her paternal aunt; Heart disease in her brother and mother; Liver cancer in her brother; Multiple myeloma (age of onset: 26) in her brother; Stroke in her father.    ROS:  Please see the history of present illness.   Otherwise, review of systems are positive for none.   All other systems are reviewed and negative.    PHYSICAL EXAM: VS:  BP 106/70   Pulse 64   Ht 5\' 6"  (1.676 m)   Wt 123 lb 12.8 oz (56.2 kg)   SpO2 96%   BMI 19.98 kg/m  , BMI Body mass index is 19.98 kg/m. GENERAL:  Well appearing HEENT:  Pupils equal round and reactive, fundi not visualized, oral mucosa unremarkable NECK:  No jugular venous distention, waveform within normal limits, carotid upstroke brisk and symmetric, no bruits, no thyromegaly LUNGS:  Clear to auscultation bilaterally HEART:  Bradycardic. Regular rhythm.  PMI not displaced or sustained,S1 and S2 within normal limits, no S3, no S4, no clicks, no rubs, no murmurs ABD:  Flat, positive bowel sounds normal in frequency in pitch, no bruits, no rebound, no guarding, no midline pulsatile mass, no hepatomegaly, no splenomegaly EXT:  2 plus pulses throughout, no edema, no cyanosis no clubbing SKIN:  No rashes no nodules NEURO:  Cranial nerves II through XII grossly intact, motor grossly intact throughout PSYCH:   Cognitively intact, oriented to person place and time  EKG:  EKG is ordered today. The ekg ordered today demonstrates sinus bradycardia.  Rate 44 bpm.   Recent Labs: 06/16/2022: ALT 10; BUN 21; Creatinine, Ser 0.96; Hemoglobin 12.0; Magnesium 1.8; Platelets 240; Potassium 4.5; Sodium 139; TSH 3.920    Lipid Panel    Component Value Date/Time   CHOL 156 06/16/2022 0947   TRIG 88 06/16/2022 0947   HDL 77 06/16/2022 0947   CHOLHDL 2.0 06/16/2022 0947   CHOLHDL 1.9 09/05/2015 0854   VLDL 19 09/05/2015 0854   LDLCALC 63 06/16/2022 0947      Wt Readings from Last 3 Encounters:  11/26/22 123 lb 12.8 oz (56.2 kg)  11/17/22 124 lb 12.8 oz (56.6 kg)  11/10/22 126 lb 6.4 oz (57.3 kg)      ASSESSMENT AND PLAN:  Ascending aortic aneurysm (HCC) Stable on imaging 10/2022.  She follows with Haley [name].  Blood pressure well-controlled.  Essential hypertension Blood pressure well-controlled on verapamil, losartan.  She was encouraged to keep up her exercise now that the PREP program has ended.   Paroxysmal SVT (supraventricular tachycardia) Symptoms better controlled since increasing verapamil.  Continue current dose.    Hyperlipidemia LDL goal <70 Lipids are very well-controlled on atorvastatin.  Keep up the exercise.     Current medicines are reviewed at length with the patient today.  The patient does not have concerns regarding medicines.  The following changes have been made:  stop metoprolol  Labs/ tests ordered today include:   No orders of the defined types were placed in this encounter.    Disposition:   FU with Adisson Deak C. Duke Salvia, MD, [facility] in 1 year.  Haley Shields, NP in 6 months.    Signed, Makaila Windle C. Duke Salvia, MD, [facility]  11/26/2022 8:46 AM    [facility]

## 2022-12-03 DIAGNOSIS — E7800 Pure hypercholesterolemia, unspecified: Secondary | ICD-10-CM | POA: Diagnosis not present

## 2022-12-03 DIAGNOSIS — I10 Essential (primary) hypertension: Secondary | ICD-10-CM | POA: Diagnosis not present

## 2022-12-04 DIAGNOSIS — Z0000 Encounter for general adult medical examination without abnormal findings: Secondary | ICD-10-CM | POA: Diagnosis not present

## 2022-12-04 DIAGNOSIS — L0291 Cutaneous abscess, unspecified: Secondary | ICD-10-CM | POA: Diagnosis not present

## 2022-12-04 DIAGNOSIS — I712 Thoracic aortic aneurysm, without rupture, unspecified: Secondary | ICD-10-CM | POA: Diagnosis not present

## 2022-12-04 DIAGNOSIS — I471 Supraventricular tachycardia, unspecified: Secondary | ICD-10-CM | POA: Diagnosis not present

## 2022-12-04 DIAGNOSIS — Z9181 History of falling: Secondary | ICD-10-CM | POA: Diagnosis not present

## 2022-12-19 DIAGNOSIS — R21 Rash and other nonspecific skin eruption: Secondary | ICD-10-CM | POA: Diagnosis not present

## 2022-12-19 DIAGNOSIS — S20161A Insect bite (nonvenomous) of breast, right breast, initial encounter: Secondary | ICD-10-CM | POA: Diagnosis not present

## 2022-12-22 DIAGNOSIS — I471 Supraventricular tachycardia, unspecified: Secondary | ICD-10-CM | POA: Diagnosis not present

## 2022-12-22 DIAGNOSIS — N1831 Chronic kidney disease, stage 3a: Secondary | ICD-10-CM | POA: Diagnosis not present

## 2022-12-22 DIAGNOSIS — G3184 Mild cognitive impairment, so stated: Secondary | ICD-10-CM | POA: Diagnosis not present

## 2022-12-22 DIAGNOSIS — R32 Unspecified urinary incontinence: Secondary | ICD-10-CM | POA: Diagnosis not present

## 2022-12-22 DIAGNOSIS — J302 Other seasonal allergic rhinitis: Secondary | ICD-10-CM | POA: Diagnosis not present

## 2022-12-22 DIAGNOSIS — M8580 Other specified disorders of bone density and structure, unspecified site: Secondary | ICD-10-CM | POA: Diagnosis not present

## 2022-12-22 DIAGNOSIS — I700 Atherosclerosis of aorta: Secondary | ICD-10-CM | POA: Diagnosis not present

## 2022-12-22 DIAGNOSIS — E785 Hyperlipidemia, unspecified: Secondary | ICD-10-CM | POA: Diagnosis not present

## 2022-12-22 DIAGNOSIS — I719 Aortic aneurysm of unspecified site, without rupture: Secondary | ICD-10-CM | POA: Diagnosis not present

## 2022-12-28 ENCOUNTER — Telehealth: Payer: Self-pay | Admitting: Cardiovascular Disease

## 2022-12-28 MED ORDER — VERAPAMIL HCL ER 180 MG PO TBCR: 180.0000 mg | EXTENDED_RELEASE_TABLET | Freq: Every day | ORAL | 3 refills | Status: DC

## 2022-12-28 MED ORDER — LOSARTAN POTASSIUM 50 MG PO TABS: ORAL_TABLET | ORAL | 3 refills | Status: DC

## 2022-12-28 MED ORDER — ATORVASTATIN CALCIUM 40 MG PO TABS: 40.0000 mg | ORAL_TABLET | Freq: Every day | ORAL | 3 refills | Status: DC

## 2022-12-28 NOTE — Telephone Encounter (Signed)
Rx request sent to pharmacy.  

## 2022-12-28 NOTE — Telephone Encounter (Signed)
*  STAT* If patient is at the pharmacy, call can be transferred to refill team.   1. Which medications need to be refilled? (please list name of each medication and dose if known)     verapamil (CALAN-SR) 180 MG CR tablet  atorvastatin (LIPITOR) 40 MG tablet  losartan (COZAAR) 50 MG tablet   2. Which pharmacy/location (including street and city if local pharmacy) is medication to be sent to?  [facility] - [place], [place] - [address] Phone: [phone]  Fax: [phone]      3. Do they need a 30 day or 90 day supply? 90

## 2023-04-06 ENCOUNTER — Other Ambulatory Visit: Payer: Self-pay | Admitting: Thoracic Surgery (Cardiothoracic Vascular Surgery)

## 2023-04-06 DIAGNOSIS — I7121 Aneurysm of the ascending aorta, without rupture: Secondary | ICD-10-CM

## 2023-05-07 DIAGNOSIS — R6884 Jaw pain: Secondary | ICD-10-CM | POA: Diagnosis not present

## 2023-05-07 DIAGNOSIS — R0981 Nasal congestion: Secondary | ICD-10-CM | POA: Diagnosis not present

## 2023-05-14 ENCOUNTER — Ambulatory Visit
Admission: RE | Admit: 2023-05-14 | Discharge: 2023-05-14 | Disposition: A | Discharge status: Home / Self Care | Payer: Medicare PPO | Source: Ambulatory Visit | Attending: Thoracic Surgery (Cardiothoracic Vascular Surgery) | Admitting: Thoracic Surgery (Cardiothoracic Vascular Surgery)

## 2023-05-14 DIAGNOSIS — R911 Solitary pulmonary nodule: Secondary | ICD-10-CM | POA: Diagnosis not present

## 2023-05-14 DIAGNOSIS — I700 Atherosclerosis of aorta: Secondary | ICD-10-CM | POA: Diagnosis not present

## 2023-05-14 DIAGNOSIS — I7121 Aneurysm of the ascending aorta, without rupture: Secondary | ICD-10-CM | POA: Diagnosis not present

## 2023-05-18 ENCOUNTER — Ambulatory Visit: Payer: Medicare PPO | Admitting: Thoracic Surgery (Cardiothoracic Vascular Surgery)

## 2023-05-18 VITALS — BP 128/80 | HR 71 | Resp 18 | Ht 66.0 in | Wt 118.0 lb

## 2023-05-18 DIAGNOSIS — I7121 Aneurysm of the ascending aorta, without rupture: Secondary | ICD-10-CM | POA: Diagnosis not present

## 2023-05-18 NOTE — Progress Notes (Signed)
[address]       Haley [name] [address]             [phone]     HPI: [name] returns for follow-up of her ascending aneurysm and groundglass opacity in the right upper lobe.  Haley [name] is a 89 year old woman with history of a stage Ia non-small cell cancer of the lung (resected 2014), ascending aortic aneurysm, hypertension, hyperlipidemia, supraventricular tachycardia, breast cancer, skin cancer, chronic pancreatitis, reflux, and irritable bowel syndrome.  First noted to have an ascending aneurysm in 2014.  Groundglass opacity in right upper lobe first noted in 2020.  I last saw her in April of this year.  She had been having some issues with bradycardia.  She had just been switched from metoprolol to verapamil.  She says that she has had much better heart rate and rhythm control with verapamil.  No change in appetite or weight loss.  No chest pain, pressure, tightness, or shortness of breath.  Past Medical History:  Diagnosis Date   Arthritis    knees and left hip   Atrophic gastritis without mention of hemorrhage    Breast cancer (HCC)    Bronchitis, not specified as acute or chronic    Cancer (HCC)    Lung cancer   Cardiac dysrhythmia, unspecified    Cataract    Chronic pancreatitis (HCC)    Complication of anesthesia    Cough    Esophageal reflux    Family history of malignant neoplasm of gastrointestinal tract    Fatty liver    Flatulence, eructation, and gas pain    Gastric erosions    Cameron   Hiatal hernia    Hoarseness    since virus 2 months agp   Irritable bowel syndrome    Irritable bowel syndrome    LGSIL (low grade squamous intraepithelial dysplasia) 11/15/2014   with biopsy of  benign vaginal cyst   Malignant neoplasm of breast (female), unspecified site    Skin Cancer legs, face, shoulder, some basal some squamous   Other chest pain    Personal history of radiation therapy    Pneumonia, organism unspecified(486)    PONV  (postoperative nausea and vomiting)    with hemorrhoid surgery years ago, no n/v with breast surgery   Pure hypercholesterolemia    Shortness of breath    with Exertion   Sorethroat    since virus 2 months ago   Symptomatic menopausal or female climacteric states    Thoracic aortic aneurysm without rupture (HCC) 08/29/12   08/29/12 discovered on PET/CT   Tubular adenoma of colon    Unspecified essential hypertension    Urinary tract infection, site not specified      Current Outpatient Medications  Medication Sig Dispense Refill   atorvastatin (LIPITOR) 40 MG tablet Take 1 tablet (40 mg total) by mouth daily. 90 tablet 3   b complex vitamins tablet Take 1 tablet by mouth daily.     benzonatate (TESSALON PERLES) 100 MG capsule Take 1 capsule (100 mg total) by mouth 3 (three) times daily as needed for cough. 20 capsule 0   Biotin 69629 MCG TABS Take 1 tablet by mouth daily.     co-enzyme Q-10 30 MG capsule Take 300 mg by mouth daily.     losartan (COZAAR) 50 MG tablet Take 1 tablet by mouth every morning and 1/2 tablet every evening. 135 tablet 3   Omega-3 1000 MG CAPS Take by  mouth.     Probiotic Product (PROBIOTIC DAILY PO) Take by mouth.     verapamil (CALAN-SR) 180 MG CR tablet Take 1 tablet (180 mg total) by mouth daily. 90 tablet 3   Vitamin D, Cholecalciferol, 50 MCG (2000 UT) CAPS Take 1 capsule by mouth daily.     No current facility-administered medications for this visit.    Physical Exam BP 128/80 (BP Location: Right Arm, Patient Position: Sitting)   Pulse 71   Resp 18   Ht 5\' 6"  (1.676 m)   Wt 118 lb (53.5 kg)   SpO2 96% Comment: RA  BMI 19.71 kg/m  89 year old woman in no acute distress Alert and oriented x 3 with no focal deficits No carotid bruits Lungs clear bilaterally Cardiac regular rate and rhythm with normal S1 and S2 No peripheral edema  Diagnostic Tests: CT CHEST WITHOUT CONTRAST   TECHNIQUE: Multidetector CT imaging of the chest was performed  following the standard protocol without IV contrast.   RADIATION DOSE REDUCTION: This exam was performed according to the departmental dose-optimization program which includes automated exposure control, adjustment of the mA and/or kV according to patient size and/or use of iterative reconstruction technique.   COMPARISON:  CT chest dated 10/09/2022   FINDINGS: Cardiovascular: 4.8 cm ascending thoracic aortic aneurysm (series 2/image 90), previously 4.7 cm, grossly unchanged. Atherosclerotic calcifications of the aortic arch.   The heart is normal in size.  No pericardial effusion.   Mild three-vessel coronary atherosclerosis.   Mediastinum/Nodes: No suspicious mediastinal lymphadenopathy.   Visualized thyroid is unremarkable.   Lungs/Pleura: Status post left upper lobe wedge resection.   3.1 x 2.4 cm subsolid nodule in the right lung apex, previously 2.9 x 1.8 cm when remeasured in a similar fashion. Central 19 mm predominantly solid component (series 8/image 30). This appearance has been slowly progressive across multiple prior studies. As such, indolent primary bronchogenic carcinoma (such as semi invasive adenocarcinoma) is possible.   Scarring/atelectasis in the anterior right middle lobe.   No focal consolidation.   No pleural effusion or pneumothorax.   Upper Abdomen: Visualized upper abdomen is grossly unremarkable, noting vascular calcifications.   Musculoskeletal: Mild degenerative changes of the visualized thoracolumbar spine.   IMPRESSION: 4.8 cm ascending thoracic aortic aneurysm, grossly unchanged. Ascending thoracic aortic aneurysm. Recommend semi-annual imaging followup by CTA or MRA and referral to cardiothoracic surgery if not already obtained. This recommendation follows 2010 ACCF/AHA/AATS/ACR/ASA/SCA/SCAI/SIR/STS/SVM Guidelines for the Diagnosis and Management of Patients With Thoracic Aortic Disease. Circulation. 2010; 121: H086-V784. Aortic  aneurysm NOS (ICD10-I71.9)   3.1 cm subsolid nodule in the right lung apex, slowly progressive across multiple prior studies. As such, indolent primary bronchogenic carcinoma (such as semi invasive adenocarcinoma) is possible. Given the patient's age, attention on follow-up is likely satisfactory.   Aortic Atherosclerosis (ICD10-I70.0).     Electronically Signed   By: Charline Bills M.D.   On: 05/14/2023 12:24 I personally reviewed the CT images.  4.7 to 4.8 cm ascending aneurysm.  Thoracic aortic atherosclerosis.  Groundglass opacity right upper lobe.  Some variability from scan to scan but no dramatic change in size dating back to 2022.  Impression:  Haley [name] is a 89 year old woman with history of a stage Ia non-small cell cancer of the lung (resected 2014), ascending aortic aneurysm, hypertension, hyperlipidemia, supraventricular tachycardia, breast cancer, skin cancer, chronic pancreatitis, reflux, and irritable bowel syndrome.  Ascending aneurysm-4.7 to 4.8 cm.  Needs continued semiannual follow-up.  Blood pressure well-controlled.  Right upper lobe  groundglass opacity- Minimal if any change in right upper lobe lung nodule in comparison to a CT from 09/10/2020. Might be a low-grade adenocarcinoma.  Does have a history of lung cancer.  I do not see any indication for aggressive intervention currently.  Did discuss the issue with her and offered her the option of biopsy and referral for stereotactic radiation.  She did not want to pursue that at this time.  Plan: Return in 6 months with CT chest to follow-up ascending aneurysm and right upper lobe groundglass opacity.  I spent over 20 minutes in review of records, images, and in consultation with Haley [name] today. Loreli Slot, MD [facility] [phone]

## 2023-06-11 DIAGNOSIS — E7800 Pure hypercholesterolemia, unspecified: Secondary | ICD-10-CM | POA: Diagnosis not present

## 2023-06-11 DIAGNOSIS — R000 Tachycardia, unspecified: Secondary | ICD-10-CM | POA: Diagnosis not present

## 2023-06-11 DIAGNOSIS — I10 Essential (primary) hypertension: Secondary | ICD-10-CM | POA: Diagnosis not present

## 2023-07-27 ENCOUNTER — Ambulatory Visit (HOSPITAL_BASED_OUTPATIENT_CLINIC_OR_DEPARTMENT_OTHER): Payer: Medicare PPO | Admitting: Family

## 2023-07-27 VITALS — BP 132/82 | HR 94 | Ht 66.0 in | Wt 117.9 lb

## 2023-07-27 DIAGNOSIS — E785 Hyperlipidemia, unspecified: Secondary | ICD-10-CM | POA: Diagnosis not present

## 2023-07-27 DIAGNOSIS — I10 Essential (primary) hypertension: Secondary | ICD-10-CM

## 2023-07-27 DIAGNOSIS — R002 Palpitations: Secondary | ICD-10-CM

## 2023-07-27 DIAGNOSIS — I7121 Aneurysm of the ascending aorta, without rupture: Secondary | ICD-10-CM | POA: Diagnosis not present

## 2023-07-27 DIAGNOSIS — I471 Supraventricular tachycardia, unspecified: Secondary | ICD-10-CM

## 2023-07-27 NOTE — Patient Instructions (Addendum)
 Medication Instructions:   Continue your current medications.   Can try: Guafenesin (Muccinex)   *If you need a refill on your cardiac medications before your next appointment, please call your pharmacy*  Follow-Up: At [facility], you and your health needs are our priority.  As part of our continuing mission to provide you with exceptional heart care, we have created designated Provider Care Teams.  These Care Teams include your primary Cardiologist (physician) and Advanced Practice Providers (APPs -  Physician Assistants and Nurse Practitioners) who all work together to provide you with the care you need, when you need it.  We recommend signing up for the patient portal called MyChart.  Sign up information is provided on this After Visit Summary.  MyChart is used to connect with patients for Virtual Visits (Telemedicine).  Patients are able to view lab/test results, encounter notes, upcoming appointments, etc.  Non-urgent messages can be sent to your provider as well.   To learn more about what you can do with MyChart, go to [email or web address].    Your next appointment:   6 month(s)  Provider:   Annabella Scarce, MD    Other Instructions    Your EKG today showed sinus bradycardia which is abnormal heart rhythm that is just slightly slow. This is a stable finding.   If you want to participate in the Right Start program simply sign up with the QR code.

## 2023-07-27 NOTE — Progress Notes (Signed)
 Cardiology Office Note:  .   Date:  07/27/2023  ID:  ZACHARY LOVINS, DOB September 17, 1933, MRN [id number] PCP: Loretha Richerd SAUNDERS, MD  [facility] Providers Cardiologist:  Annabella Scarce, MD    History of Present Illness: Haley   NAARAH [name] is a 89 y.o. female  with a hx of hypertension, hyperlipidemia, SVT, aortic atherosclerosis, descending thoracic aortic aneurysm, splenic artery aneurysm, lung cancer s/p wedge resection, remote breast cancer, hiatal hernia, GERD   Previously followed with Dr. Burnard but moved to Dr. Scarce to be closer to home.  Prior monitor 2019 isolated PAC and PVCs no significant arrhythmia.  Previously taking verapamil  daily and metoprolol  as needed.  It was later increased to daily.  Echo 02/2021 LVEF suggested 5% with indeterminate diastolic dysfunction with mild aortic valve sclerosis that stenosis.   History of 4.5 cm descending thoracic aortic aneurysm.  CT chest 03/2022 with stable 4.6 cm not aneurysm of the ascending aorta.  At visit 05/2022 metoprolol  is discontinued and bradycardia.  Subsequent monitor with predominantly  normal sinus rhythm with average heart rate 71 bpm.  First-degree AV block present.  1595 episodes of SVT with longest 17.9 seconds at 100 bpm.  2.4% PAC burden.   Last seen 11/26/2022 by Dr. Scarce.  Ascending aortic aneurysm noted be stable by imaging 10/2022.  BP was well-controlled.  SVT symptoms improved since increasing dose of verapamil .  She had completed the prep exercise program.  Presents today for follow-up with her son. Notes her husband is in ED presently with concerning neurological symptoms after stopping his anticoagulant - offered condolences and resources for affording OAC. Overall she is feeling well since last seen. Has not kept up with exercise routine since completing PREP as she wished. Reports no shortness of breath nor dyspnea on exertion. Reports no chest pain, pressure, or tightness. No edema, orthopnea, PND.  Reports no palpitations.  Does note some sinus drainage for which she is following with PCP and considering ENT.  ROS: Please see the history of present illness.    All other systems reviewed and are negative.   Studies Reviewed: Haley   EKG Interpretation Date/Time:  Tuesday July 27 2023 15:39:04 EST Ventricular Rate:  56 PR Interval:  224 QRS Duration:  98 QT Interval:  420 QTC Calculation: 405 R Axis:   -18  Text Interpretation: Sinus bradycardia with 1st degree A-V block with Premature atrial complexes Confirmed by Vannie Mora ([id number]) on 07/27/2023 3:49:57 PM    Cardiac Studies & Procedures      ECHOCARDIOGRAM  ECHOCARDIOGRAM COMPLETE 03/17/2021  Narrative ECHOCARDIOGRAM REPORT    Patient Name:   Haley [name] Date of Exam: 03/17/2021 Medical Rec #:  [id number]       Height:       66.0 in Accession #:    [id number]      Weight:       136.8 lb Date of Birth:  1933/11/18      BSA:          1.702 m Patient Age:    86 years        BP:           150/60 mmHg Patient Gender: F               HR:           55 bpm. Exam Location:  [address]  Procedure: 2D Echo, Cardiac Doppler and Color Doppler  Indications:    I49.3 PVC's.  History:        Patient has no prior history of Echocardiogram examinations. Chest pain. Shortness of breath. Pneumonia.  Sonographer:    Carl Rodgers-Jones RDCS Referring Phys: [id number] THOMAS A KELLY  IMPRESSIONS   1. Left ventricular ejection fraction, by estimation, is 60 to 65%. The left ventricle has normal function. The left ventricle has no regional wall motion abnormalities. Left ventricular diastolic parameters are indeterminate. 2. Right ventricular systolic function is normal. The right ventricular size is normal. 3. Mild mitral valve regurgitation. 4. The aortic valve is tricuspid. Aortic valve regurgitation is moderate. Mild aortic valve sclerosis is present, with no evidence of aortic valve stenosis. 5. Aortic dilatation  noted. There is mild dilatation of the aortic root, measuring 43 mm. 6. The inferior vena cava is normal in size with greater than 50% respiratory variability, suggesting right atrial pressure of 3 mmHg.  FINDINGS Left Ventricle: Left ventricular ejection fraction, by estimation, is 60 to 65%. The left ventricle has normal function. The left ventricle has no regional wall motion abnormalities. The left ventricular internal cavity size was normal in size. There is no left ventricular hypertrophy. Left ventricular diastolic parameters are indeterminate.  Right Ventricle: The right ventricular size is normal. Right vetricular wall thickness was not assessed. Right ventricular systolic function is normal.  Left Atrium: Left atrial size was normal in size.  Right Atrium: Right atrial size was normal in size.  Pericardium: There is no evidence of pericardial effusion.  Mitral Valve: There is mild thickening of the mitral valve leaflet(s). Mild mitral valve regurgitation.  Tricuspid Valve: The tricuspid valve is normal in structure. Tricuspid valve regurgitation is mild.  Aortic Valve: The aortic valve is tricuspid. Aortic valve regurgitation is moderate. Aortic regurgitation PHT measures 912 msec. Mild aortic valve sclerosis is present, with no evidence of aortic valve stenosis.  Pulmonic Valve: The pulmonic valve was normal in structure. Pulmonic valve regurgitation is trivial.  Aorta: Aortic dilatation noted. There is mild dilatation of the aortic root, measuring 43 mm.  Venous: The inferior vena cava is normal in size with greater than 50% respiratory variability, suggesting right atrial pressure of 3 mmHg.  IAS/Shunts: No atrial level shunt detected by color flow Doppler.   LEFT VENTRICLE PLAX 2D LVIDd:         4.10 cm  Diastology LVIDs:         2.80 cm  LV e' medial:    5.22 cm/s LV PW:         0.70 cm  LV E/e' medial:  14.4 LV IVS:        0.70 cm  LV e' lateral:   6.85 cm/s LVOT  diam:     2.10 cm  LV E/e' lateral: 11.0 LV SV:         98 LV SV Index:   58 LVOT Area:     3.46 cm   RIGHT VENTRICLE             IVC RV Basal diam:  4.00 cm     IVC diam: 1.90 cm RV S prime:     14.20 cm/s TAPSE (M-mode): 2.5 cm  LEFT ATRIUM             Index       RIGHT ATRIUM           Index LA diam:        4.50 cm 2.64 cm/m  RA Area:     12.10 cm LA Vol (  A2C):   49.4 ml 29.03 ml/m RA Volume:   26.50 ml  15.57 ml/m LA Vol (A4C):   36.7 ml 21.57 ml/m LA Biplane Vol: 45.6 ml 26.80 ml/m AORTIC VALVE LVOT Vmax:   125.50 cm/s LVOT Vmean:  91.650 cm/s LVOT VTI:    0.283 m AI PHT:      912 msec  AORTA Ao Root diam: 4.30 cm Ao Asc diam:  4.70 cm  MITRAL VALVE               TRICUSPID VALVE MV Area (PHT): 3.42 cm    TR Peak grad:   21.3 mmHg MV Decel Time: 222 msec    TR Vmax:        231.00 cm/s MV E velocity: 75.40 cm/s MV A velocity: 78.00 cm/s  SHUNTS MV E/A ratio:  0.97        Systemic VTI:  0.28 m Systemic Diam: 2.10 cm  Vina Gull MD Electronically signed by Vina Gull MD Signature Date/Time: 03/17/2021/6:36:08 PM    Final   MONITORS  LONG TERM MONITOR (3-14 DAYS) 07/02/2022  Narrative 7 Day Zio Monitor  Quality: Fair.  Baseline artifact. Predominant rhythm: sinus rhythm with first degree AV block Average heart rate: 70 bpm Max heart rate: 133 bpm Min heart rate: 39 bpm Pauses >2.5 seconds: none  Frequent (1595) episodes of SVT.  Longest episode 17.9 s.  Maximum heart rate 203 bpm. Occasional (2.4%) PACs and atrial couplets (2.6%) or triplets (2.4%) Rare (<1%) PVCs   Tiffany C. Raford, MD, [facility] 08/17/2022 8:27 AM           Risk Assessment/Calculations:             Physical Exam:   VS:  BP 132/82   Pulse 94   Ht 5' 6 (1.676 m)   Wt 117 lb 14.4 oz (53.5 kg)   SpO2 96%   BMI 19.03 kg/m    Wt Readings from Last 3 Encounters:  07/27/23 117 lb 14.4 oz (53.5 kg)  05/18/23 118 lb (53.5 kg)  11/26/22 125 lb 6.4 oz (56.9 kg)    GEN: Well  nourished, well developed in no acute distress NECK: No JVD; No carotid bruits CARDIAC: bradycardic, RRR, no murmurs, rubs, gallops RESPIRATORY:  Clear to auscultation without rales, wheezing or rhonchi  ABDOMEN: Soft, non-tender, non-distended EXTREMITIES:  No edema; No deformity   ASSESSMENT AND PLAN: .    Hypertension- BP well controlled. Continue current antihypertensive regimen.     Paroxysmal SVT-metoprolol  previously discontinued for bradycardia.  Quiescent on present dose verapamil .  Hyperlipidemia, LDL goal less than 70-continue atorvastatin , omega-3.   Ascending aortic aneurysm- Last imaged 04/2023. Follows with Dr. Kerrin.         Dispo: follow up in 6 months with Dr. Raford  Signed, Reche GORMAN Finder, NP

## 2023-07-28 ENCOUNTER — Encounter (HOSPITAL_BASED_OUTPATIENT_CLINIC_OR_DEPARTMENT_OTHER): Payer: Self-pay | Admitting: Family

## 2023-09-06 DIAGNOSIS — D225 Melanocytic nevi of trunk: Secondary | ICD-10-CM | POA: Diagnosis not present

## 2023-09-06 DIAGNOSIS — L723 Sebaceous cyst: Secondary | ICD-10-CM | POA: Diagnosis not present

## 2023-09-06 DIAGNOSIS — D2261 Melanocytic nevi of right upper limb, including shoulder: Secondary | ICD-10-CM | POA: Diagnosis not present

## 2023-09-06 DIAGNOSIS — Z85828 Personal history of other malignant neoplasm of skin: Secondary | ICD-10-CM | POA: Diagnosis not present

## 2023-09-06 DIAGNOSIS — L821 Other seborrheic keratosis: Secondary | ICD-10-CM | POA: Diagnosis not present

## 2023-09-06 DIAGNOSIS — D2271 Melanocytic nevi of right lower limb, including hip: Secondary | ICD-10-CM | POA: Diagnosis not present

## 2023-09-06 DIAGNOSIS — L570 Actinic keratosis: Secondary | ICD-10-CM | POA: Diagnosis not present

## 2023-09-06 DIAGNOSIS — D2262 Melanocytic nevi of left upper limb, including shoulder: Secondary | ICD-10-CM | POA: Diagnosis not present

## 2023-09-16 DIAGNOSIS — J309 Allergic rhinitis, unspecified: Secondary | ICD-10-CM | POA: Diagnosis not present

## 2023-09-30 ENCOUNTER — Other Ambulatory Visit: Payer: Self-pay | Admitting: Thoracic Surgery (Cardiothoracic Vascular Surgery)

## 2023-09-30 DIAGNOSIS — I7121 Aneurysm of the ascending aorta, without rupture: Secondary | ICD-10-CM

## 2023-09-30 DIAGNOSIS — R918 Other nonspecific abnormal finding of lung field: Secondary | ICD-10-CM

## 2023-10-14 DIAGNOSIS — Z8249 Family history of ischemic heart disease and other diseases of the circulatory system: Secondary | ICD-10-CM | POA: Diagnosis not present

## 2023-10-14 DIAGNOSIS — M545 Low back pain, unspecified: Secondary | ICD-10-CM | POA: Diagnosis not present

## 2023-10-14 DIAGNOSIS — I700 Atherosclerosis of aorta: Secondary | ICD-10-CM | POA: Diagnosis not present

## 2023-10-14 DIAGNOSIS — I129 Hypertensive chronic kidney disease with stage 1 through stage 4 chronic kidney disease, or unspecified chronic kidney disease: Secondary | ICD-10-CM | POA: Diagnosis not present

## 2023-10-14 DIAGNOSIS — I719 Aortic aneurysm of unspecified site, without rupture: Secondary | ICD-10-CM | POA: Diagnosis not present

## 2023-10-14 DIAGNOSIS — R32 Unspecified urinary incontinence: Secondary | ICD-10-CM | POA: Diagnosis not present

## 2023-10-14 DIAGNOSIS — E785 Hyperlipidemia, unspecified: Secondary | ICD-10-CM | POA: Diagnosis not present

## 2023-10-14 DIAGNOSIS — N1831 Chronic kidney disease, stage 3a: Secondary | ICD-10-CM | POA: Diagnosis not present

## 2023-10-14 DIAGNOSIS — I471 Supraventricular tachycardia, unspecified: Secondary | ICD-10-CM | POA: Diagnosis not present

## 2023-11-09 ENCOUNTER — Encounter: Payer: Self-pay | Admitting: Thoracic Surgery (Cardiothoracic Vascular Surgery)

## 2023-11-15 DIAGNOSIS — H40053 Ocular hypertension, bilateral: Secondary | ICD-10-CM | POA: Diagnosis not present

## 2023-11-16 ENCOUNTER — Ambulatory Visit
Admission: RE | Admit: 2023-11-16 | Discharge: 2023-11-16 | Disposition: A | Discharge status: Home / Self Care | Source: Ambulatory Visit | Attending: Thoracic Surgery (Cardiothoracic Vascular Surgery) | Admitting: Thoracic Surgery (Cardiothoracic Vascular Surgery)

## 2023-11-16 DIAGNOSIS — I7121 Aneurysm of the ascending aorta, without rupture: Secondary | ICD-10-CM | POA: Diagnosis not present

## 2023-11-16 DIAGNOSIS — R918 Other nonspecific abnormal finding of lung field: Secondary | ICD-10-CM

## 2023-11-16 DIAGNOSIS — J9811 Atelectasis: Secondary | ICD-10-CM | POA: Diagnosis not present

## 2023-11-16 DIAGNOSIS — J849 Interstitial pulmonary disease, unspecified: Secondary | ICD-10-CM | POA: Diagnosis not present

## 2023-11-19 DIAGNOSIS — H1045 Other chronic allergic conjunctivitis: Secondary | ICD-10-CM | POA: Diagnosis not present

## 2023-11-19 DIAGNOSIS — J300 Vasomotor rhinitis: Secondary | ICD-10-CM | POA: Diagnosis not present

## 2023-11-19 DIAGNOSIS — J3089 Other allergic rhinitis: Secondary | ICD-10-CM | POA: Diagnosis not present

## 2023-11-23 ENCOUNTER — Ambulatory Visit
Attending: Thoracic Surgery (Cardiothoracic Vascular Surgery) | Admitting: Thoracic Surgery (Cardiothoracic Vascular Surgery)

## 2023-11-23 VITALS — BP 134/72 | HR 56 | Resp 18 | Ht 66.0 in | Wt 121.0 lb

## 2023-11-23 DIAGNOSIS — I7121 Aneurysm of the ascending aorta, without rupture: Secondary | ICD-10-CM

## 2023-11-23 NOTE — Progress Notes (Signed)
 [address]       Haley [name] [id number]             [phone]    HPI: Haley [name] returns for follow-up of her ostomy aneurysm and lung nodules.  Haley [name] is an 89 year old woman with a history of stage Ia non-small cell carcinoma of the lung, ascending aneurysm, hypertension, hyperlipidemia, supraventricular tachycardia, breast cancer, skin cancer, chronic pancreatitis, reflux, irritable bowel syndrome, and lung nodules.  She is been followed for aneurysm since 2014.  She has been followed for a complex groundglass opacity in right upper lobe since 2020.  I last saw her in October 2024.  She was doing well at that time.  Her aneurysm was stable.  We discussed possible biopsy of the right upper lobe lesion but she was not interested.  In the interim since her last visit she has been having a lot of trouble with allergies.  She recently saw an allergist and that she has been started on a new medication regimen.  In fact is going to start that today.  No chest pain, pressure, or tightness.   Past Medical History:  Diagnosis Date   Arthritis    knees and left hip   Atrophic gastritis without mention of hemorrhage    Breast cancer (HCC)    Bronchitis, not specified as acute or chronic    Cancer (HCC)    Lung cancer   Cardiac dysrhythmia, unspecified    Cataract    Chronic pancreatitis (HCC)    Complication of anesthesia    Cough    Esophageal reflux    Family history of malignant neoplasm of gastrointestinal tract    Fatty liver    Flatulence, eructation, and gas pain    Gastric erosions    [place]   Hiatal hernia    Hoarseness    since virus 2 months agp   Irritable bowel syndrome    Irritable bowel syndrome    LGSIL (low grade squamous intraepithelial dysplasia) 11/15/2014   with biopsy of  benign vaginal cyst   Malignant neoplasm of breast (female), unspecified site    Skin Cancer legs, face, shoulder, some basal some squamous   Other chest pain     Personal history of radiation therapy    Pneumonia, organism unspecified(486)    PONV (postoperative nausea and vomiting)    with hemorrhoid surgery years ago, no n/v with breast surgery   Pure hypercholesterolemia    Shortness of breath    with Exertion   Sorethroat    since virus 2 months ago   Symptomatic menopausal or female climacteric states    Thoracic aortic aneurysm without rupture (HCC) 08/29/12   08/29/12 discovered on PET/CT   Tubular adenoma of colon    Unspecified essential hypertension    Urinary tract infection, site not specified     Current Outpatient Medications  Medication Sig Dispense Refill   atorvastatin  (LIPITOR) 40 MG tablet Take 1 tablet (40 mg total) by mouth daily. 90 tablet 3   b complex vitamins tablet Take 1 tablet by mouth daily.     benzonatate  (TESSALON  PERLES) 100 MG capsule Take 1 capsule (100 mg total) by mouth 3 (three) times daily as needed for cough. 20 capsule 0   Biotin 13086 MCG TABS Take 1 tablet by mouth daily.     co-enzyme Q-10 30 MG capsule Take 300 mg by mouth daily.     losartan  (COZAAR ) 50 MG tablet Take  1 tablet by mouth every morning and 1/2 tablet every evening. 135 tablet 3   Omega-3 1000 MG CAPS Take by mouth.     Probiotic Product (PROBIOTIC DAILY PO) Take by mouth.     verapamil  (CALAN -SR) 180 MG CR tablet Take 1 tablet (180 mg total) by mouth daily. 90 tablet 3   Vitamin D , Cholecalciferol, 50 MCG (2000 UT) CAPS Take 1 capsule by mouth daily.     No current facility-administered medications for this visit.    Physical Exam BP 134/72   Pulse (!) 56   Resp 18   Ht 5\' 6"  (1.676 m)   Wt 121 lb (54.9 kg)   SpO2 97%   BMI 19.18 kg/m  89 year old woman in no acute distress Alert and oriented x 3 with no focal deficits Lungs diminished but otherwise clear bilaterally Cardiac regular rate and rhythm, no murmur No peripheral edema   Diagnostic Tests: CT CHEST WITHOUT CONTRAST   TECHNIQUE: Multidetector CT imaging of  the chest was performed following the standard protocol without IV contrast.   RADIATION DOSE REDUCTION: This exam was performed according to the departmental dose-optimization program which includes automated exposure control, adjustment of the mA and/or kV according to patient size and/or use of iterative reconstruction technique.   COMPARISON:  October 8 04/2023   FINDINGS: Cardiovascular: Comparison with prior examination proximal ascending aorta measures 4.4 x 4.9 cm. Distal ascending aorta measures 4.7 x 4.7 cm. Aortic arch is normal. Descending thoracic aorta is normal with calcifications. No pericardial effusions. Moderate coronary artery calcifications.   Mediastinum/Nodes: No enlarged mediastinal or axillary lymph nodes. Thyroid  gland, trachea, and esophagus demonstrate no significant findings.   Lungs/Pleura: Comparison with prior examinations demonstrates no change in the elongated nodular density of the medial segment right middle lobe image 86 measuring 2.1 x 0.9 cm with some associated atelectasis. Could represent nodular atelectasis.   Comparison with prior examinations demonstrates no significant change in the several small bilateral 2-4 mm pulmonary nodules throughout both lungs with some tree-in-bud appearance of the right lower lobe.   No new pulmonary nodules.   Chronic interstitial lung disease and centrilobular emphysema.   Upper Abdomen: No acute abnormality.   Musculoskeletal: No chest wall mass or suspicious bone lesions identified.   IMPRESSION: *No change in the elongated nodular density of the medial segment right middle lobe measuring 2.1 x 0.9 cm with some associated atelectasis. Could represent nodular atelectasis. Lung-RADS 2, benign appearance or behavior. Continue annual screening with low-dose chest CT without contrast in 12 months. *No change in the several small bilateral 2-4 mm pulmonary nodules throughout both lungs with some  tree-in-bud appearance of the right lower lobe. No new pulmonary nodules. *Chronic interstitial lung disease and centrilobular emphysema. *No change in the ascending aortic aneurysm. Recommend annual imaging followup by CTA or MRA. This recommendation follows 2010 ACCF/AHA/AATS/ACR/ASA/SCA/SCAI/SIR/STS/SVM Guidelines for the Diagnosis and Management of Patients with Thoracic Aortic Disease. Circulation. 2010; 121: Z610-R604. Aortic aneurysm NOS (ICD10-I71.9)     Electronically Signed   By: Fredrich Jefferson M.D.   On: 11/16/2023 12:08  I personally reviewed the CT images.  There is no change in the right upper lobe groundglass opacity, tubular right middle lobe density, or 4.8 cm ascending aneurysm.  Impression: Haley [name] is an 89 year old woman with a history of stage Ia non-small cell carcinoma of the lung, ascending aneurysm, hypertension, hyperlipidemia, supraventricular tachycardia, breast cancer, skin cancer, chronic pancreatitis, reflux, irritable bowel syndrome, and lung nodules.  Stage Ia  non-small cell carcinoma of the lung-resected 2014.  No evidence of recurrence.  Right upper lobe groundglass opacity-complex lesion suspicious for a low-grade adenocarcinoma but has been present for years without significant change.  No change over the past 6 months.  I discussed biopsy previously and she wanted to continue with radiographic follow-up.  She still wishes to continue with that.  Right middle lobe nodule-unchanged.  Ascending aneurysm-stable at about 4.7 to 4.8 cm.  Hypertension blood pressure reasonably controlled.  Plan: Return in 6 months with CT chest.  Will not use contrast unless needed to clarify the change in size of the aneurysm.  Zelphia Higashi, MD [facility] [phone]

## 2023-12-08 ENCOUNTER — Telehealth: Payer: Self-pay | Admitting: Cardiovascular Disease

## 2023-12-08 NOTE — Telephone Encounter (Signed)
 Called and spoke to patient. Patient verified with 2 identifiers (DOB and Last Name)  Patient states:  - Pt denies any symptoms.  - [facility] took BP on each arm and they also did carotid artery ultrasound and did ultrasound to assess possible abd aneurysm.  - Phone sometimes show that her HR will go low and/or high; only happens momentarily but pt denies feeling any kind of symptoms at time of phone alerts.  - Pt is to receive copies of US  and testing down within 2 weeks and will bring to OV with Dr. Theodis Fiscal.  Nurse's Recommendations:  - Will route to Dr. Theodis Fiscal and her nurse as an Arlie Lain.

## 2023-12-08 NOTE — Telephone Encounter (Signed)
 Pt called in stating she had a screening done with [facility] and it showed signs of afib. She states she hasn't had any symptoms, but she wanted Dr. Theodis Fiscal to know.

## 2023-12-14 NOTE — Telephone Encounter (Signed)
 Called and spoke to patient. Patient verified with 2 identifiers (DOB and Last Name) Discussed MD's recommendations.  Patient states:  - For the past 2 days, HR has been high but not over 150. HR in the 80s while at rest sometimes (according to apple watch)  - Denies lightheadedness and dizziness.  Nurse's Recommendations:  - Continue to monitor and bring [facility] documents to next OV.  - Reassured pt that HR in the 80s is normal.   Patient verbalized understanding and was very thankful for call.

## 2023-12-14 NOTE — Telephone Encounter (Signed)
 Left message to call back

## 2023-12-15 NOTE — Telephone Encounter (Signed)
 Called and spoke to pt. Pt advised to please disregard previous missed call and voice message to call office back. Pt verbalized understanding.

## 2023-12-15 NOTE — Telephone Encounter (Signed)
Pt returning nurse call. Please advise.

## 2023-12-25 DIAGNOSIS — E7800 Pure hypercholesterolemia, unspecified: Secondary | ICD-10-CM | POA: Diagnosis not present

## 2023-12-25 DIAGNOSIS — I10 Essential (primary) hypertension: Secondary | ICD-10-CM | POA: Diagnosis not present

## 2024-01-03 ENCOUNTER — Other Ambulatory Visit: Payer: Self-pay | Admitting: Cardiovascular Disease

## 2024-01-12 DIAGNOSIS — Z0000 Encounter for general adult medical examination without abnormal findings: Secondary | ICD-10-CM | POA: Diagnosis not present

## 2024-01-12 DIAGNOSIS — E7800 Pure hypercholesterolemia, unspecified: Secondary | ICD-10-CM | POA: Diagnosis not present

## 2024-01-12 DIAGNOSIS — Z23 Encounter for immunization: Secondary | ICD-10-CM | POA: Diagnosis not present

## 2024-01-12 DIAGNOSIS — I471 Supraventricular tachycardia, unspecified: Secondary | ICD-10-CM | POA: Diagnosis not present

## 2024-01-12 DIAGNOSIS — I712 Thoracic aortic aneurysm, without rupture, unspecified: Secondary | ICD-10-CM | POA: Diagnosis not present

## 2024-01-12 DIAGNOSIS — K5900 Constipation, unspecified: Secondary | ICD-10-CM | POA: Diagnosis not present

## 2024-01-12 DIAGNOSIS — I10 Essential (primary) hypertension: Secondary | ICD-10-CM | POA: Diagnosis not present

## 2024-01-19 ENCOUNTER — Encounter (HOSPITAL_BASED_OUTPATIENT_CLINIC_OR_DEPARTMENT_OTHER): Payer: Self-pay | Admitting: *Deleted

## 2024-01-20 ENCOUNTER — Ambulatory Visit (HOSPITAL_BASED_OUTPATIENT_CLINIC_OR_DEPARTMENT_OTHER): Payer: Medicare PPO | Admitting: Cardiovascular Disease

## 2024-01-20 ENCOUNTER — Other Ambulatory Visit (HOSPITAL_BASED_OUTPATIENT_CLINIC_OR_DEPARTMENT_OTHER): Payer: Self-pay | Admitting: Cardiovascular Disease

## 2024-01-20 ENCOUNTER — Encounter (HOSPITAL_BASED_OUTPATIENT_CLINIC_OR_DEPARTMENT_OTHER): Payer: Self-pay | Admitting: Cardiovascular Disease

## 2024-01-20 VITALS — BP 124/80 | HR 61 | Ht 66.0 in | Wt 119.0 lb

## 2024-01-20 DIAGNOSIS — E7800 Pure hypercholesterolemia, unspecified: Secondary | ICD-10-CM

## 2024-01-20 DIAGNOSIS — I499 Cardiac arrhythmia, unspecified: Secondary | ICD-10-CM

## 2024-01-20 DIAGNOSIS — I471 Supraventricular tachycardia, unspecified: Secondary | ICD-10-CM | POA: Diagnosis not present

## 2024-01-20 DIAGNOSIS — R42 Dizziness and giddiness: Secondary | ICD-10-CM

## 2024-01-20 DIAGNOSIS — I7121 Aneurysm of the ascending aorta, without rupture: Secondary | ICD-10-CM

## 2024-01-20 DIAGNOSIS — I10 Essential (primary) hypertension: Secondary | ICD-10-CM

## 2024-01-20 NOTE — Progress Notes (Signed)
 Cardiology Office Note:  .   Date:  01/20/2024  ID:  [name], DOB 08-08-1933, MRN [id number] PCP: Dayna Motto, DO  [facility] Providers Cardiologist:  Annabella Scarce, MD    History of Present Illness: Haley Haley [name]    Haley Haley [name] is a 89 y.o. female with hypertension, hyperlipidemia, mild carotid stenosis, SVT, aortic atherosclerosis, ascending and descending thoracic aortic aneurysm, splenic artery aneurysm, lung cancer status post wedge resection, remote breast cancer, hiatal hernia, and GERD here for follow-up.  She has been followed by Dr. Burnard for SVT and is now here to be at a closer office.  In the past she has been treated with verapamil  and beta-blockers.  She wore a monitor in 2019 that revealed isolated PACs and PVCs with no significant arrhythmias.  She saw Dr. Mona for an urgent visit 06/2021 after experiencing more palpitations.  She had taken some of her husbands medication which resulted in bradycardia.  She last saw Dr. Burnard 08/2021 and was taking her beta-blocker only on an as-needed basis.  She rarely needed to use it.  She did report both high and low heart rates and was using metoprolol  only on an as-needed basis.  She is taking verapamil  daily.  She had an echo 02/2021 that revealed LVEF 60 to 65% with indeterminate diastolic function.  She had mild aortic valve sclerosis without stenosis.  She has a history of a 4.5 cm descending thoracic aortic aneurysm.  CT of the chest 03/2022 revealed a stable 4.6 cm aneurysm of the ascending aorta.     At her visit 05/2022 she reported increased palpitations.  She started metoprolol  12.5 mg every 12 hours but thought that her heart rate was getting too low.  She was feeling more tired.  Based on her Apple Watch she was concern for atrial fibrillation but no atrial fibrillation was noted.  She wore a 7-day ZIO that showed frequent episodes of SVT.  The longest lasted 18 seconds.  Maximum heart rate was 218 bpm.  She had rare PVCs.   Her minimum heart rate was 39 bpm.  She followed up with Reche Finder, NP 07/2022 and she was not interested in starting any antiarrhythmic medications.  Blood pressure was slightly elevated at home and she was asked to track it.  At her visit 11/2022 overall she was feeling better.  She participated in the PREP program.  Palpitations were better controlled on verapamil .  She saw Reche Finder, NP 06/2023 and was not exercising regularly after finishing the prep program.  She saw Dr. Kerrin 10/2023 and was stable.  He recommended 6 month f/u CT.    Discussed the use of AI scribe software for clinical note transcription with the patient, who gave verbal consent to proceed.  History of Present Illness [name] experiences episodes of dizziness, particularly when getting up or stepping up, which resolve after sitting down for a short period. No chest pain or pressure during these episodes. She does not regularly check her blood pressure at home but uses a watch to monitor her heart rate, which fluctuates between the 40s and 120s. She has a history of arrhythmia and was previously diagnosed with tachycardia. She recalls a previous life scan indicating atrial fibrillation, but she is unsure of the current status of her heart rhythm.  She is currently taking verapamil  180 mg for her heart rate issues, which also helps with blood pressure control. She also takes omega-3 and atorvastatin  for cholesterol management, and losartan  for  blood pressure.  She has been experiencing gum sensitivity and swelling, particularly in the upper gum area, following recent dental work and teeth extractions. She was evaluated by her primary care doctor and was started on amoxicillin  yesterday after dental work and teeth extractions. She has taken three doses so far.  Her allergies have been bothersome, but after seeing an allergist, it was determined she is not allergic to anything specific. She was prescribed two nasal  sprays and a pill, which have significantly helped her symptoms.  In terms of physical activity, she acknowledges she has not been exercising much but stays active through housework and caring for her husband, who had a stroke. She plans to start walking again soon.  ROS:  As per HPI  Studies Reviewed: .       Echo 02/2021:   1. Left ventricular ejection fraction, by estimation, is 60 to 65%. The  left ventricle has normal function. The left ventricle has no regional  wall motion abnormalities. Left ventricular diastolic parameters are  indeterminate.   2. Right ventricular systolic function is normal. The right ventricular  size is normal.   3. Mild mitral valve regurgitation.   4. The aortic valve is tricuspid. Aortic valve regurgitation is moderate.  Mild aortic valve sclerosis is present, with no evidence of aortic valve  stenosis.   5. Aortic dilatation noted. There is mild dilatation of the aortic root,  measuring 43 mm.   6. The inferior vena cava is normal in size with greater than 50%  respiratory variability, suggesting right atrial pressure of 3 mmHg.   7 Day Zio Monitor 06/2022:   Quality: Fair.  Baseline artifact. Predominant rhythm: sinus rhythm with first degree AV block Average heart rate: 70 bpm Max heart rate: 133 bpm Min heart rate: 39 bpm Pauses >2.5 seconds: none   Frequent (1595) episodes of SVT.  Longest episode 17.9 s.  Maximum heart rate 203 bpm. Occasional (2.4%) PACs and atrial couplets (2.6%) or triplets (2.4%)  Rare (<1%) PVCs  Risk Assessment/Calculations:             Physical Exam:   VS:  BP 124/80   Pulse 61   Ht 5' 6 (1.676 m)   Wt 119 lb (54 kg)   SpO2 98%   BMI 19.21 kg/m  , BMI Body mass index is 19.21 kg/m. GENERAL:  Well appearing HEENT: Pupils equal round and reactive, fundi not visualized, oral mucosa unremarkable NECK:  No jugular venous distention, waveform within normal limits, carotid upstroke brisk and symmetric, no  bruits, no thyromegaly LUNGS:  Clear to auscultation bilaterally HEART:  RRR.  PMI not displaced or sustained,S1 and S2 within normal limits, no S3, no S4, no clicks, no rubs, no murmurs ABD:  Flat, positive bowel sounds normal in frequency in pitch, no bruits, no rebound, no guarding, no midline pulsatile mass, no hepatomegaly, no splenomegaly EXT:  2 plus pulses throughout, no edema, no cyanosis no clubbing SKIN:  No rashes no nodules NEURO:  Cranial nerves II through XII grossly intact, motor grossly intact throughout PSYCH:  Cognitively intact, oriented to person place and time   ASSESSMENT AND PLAN: .    Assessment & Plan # Arrhythmia # SVT: She has a known history of SVT.  Symptoms are managed on verapamil .  I reviewed the strip from her Life 360 scan.  It looks like an ectopic atrial rhythm.  Will get 30 day monitor to rule out atrial fibrillation. - Order 30-day heart monitor  to assess for arrhythmias. - Continue verapamil  for heart rate control.  # Hypertension Blood pressure well-controlled with verapamil  and losartan . - Continue verapamil  and losartan . - Encouraged her to increase her exercise  # Hyperlipidemia Cholesterol levels well-managed with atorvastatin  and omega-3 supplements. - Continue atorvastatin  and omega-3 supplements.  # Aortic aneurysm: Moderate ascending and descending thoracic aneurysm.  She follows with Dr. Kerrin.  BP controlled.      Dispo: f/u in 6 months  Signed, Annabella Scarce, MD

## 2024-01-20 NOTE — Patient Instructions (Addendum)
 Medication Instructions:  Your physician recommends that you continue on your current medications as directed. Please refer to the Current Medication list given to you today.  *If you need a refill on your cardiac medications before your next appointment, please call your pharmacy*  Lab Work: NONE  Testing/Procedures: Your physician has recommended that you wear an event monitor. Event monitors are medical devices that record the heart's electrical activity. Doctors most often us  these monitors to diagnose arrhythmias. Arrhythmias are problems with the speed or rhythm of the heartbeat. The monitor is a small, portable device. You can wear one while you do your normal daily activities. This is usually used to diagnose what is causing palpitations/syncope (passing out). 30 DAY - THIS WILL BE MAILED TO YOU   Follow-Up: At [facility], you and your health needs are our priority.  As part of our continuing mission to provide you with exceptional heart care, we have created designated Provider Care Teams.  These Care Teams include your primary Cardiologist (physician) and Advanced Practice Providers (APPs -  Physician Assistants and Nurse Practitioners) who all work together to provide you with the care you need, when you need it.  We recommend signing up for the patient portal called MyChart.  Sign up information is provided on this After Visit Summary.  MyChart is used to connect with patients for Virtual Visits (Telemedicine).  Patients are able to view lab/test results, encounter notes, upcoming appointments, etc.  Non-urgent messages can be sent to your provider as well.   To learn more about what you can do with MyChart, go to [email or web address].    Your next appointment:   6 month(s)  The format for your next appointment:   In Person  Provider:   DR RAFORD RECHE ORN NP, OR ROSALINE RAMAN NP   [employer] Cardiac Event Monitor Instructions  Your physician has requested you  wear your cardiac event monitor for _____ days, (1-30). [employer] may call or text to confirm a shipping address. The monitor will be sent to a land address via UPS. [employer] will not ship a monitor to a PO BOX. It typically takes 3-5 days to receive your monitor after it has been enrolled. [employer] will assist with USPS tracking if your package is delayed. The telephone number for [employer] is [phone]. Once you have received your monitor, please review the enclosed instructions. Instruction tutorials can also be viewed under help and settings on the enclosed cell phone. Your monitor has already been registered assigning a specific monitor serial # to you.  Billing and Self Pay Discount Information  [employer] has been provided the insurance information we had on file for you.  If your insurance has been updated, please call [employer] at [phone] to provide them with your updated insurance information.   [employer] offers a discounted Self Pay option for patients who have insurance that does not cover their cardiac event monitor or patients without insurance.  The discounted cost of a Self Pay Cardiac Event Monitor would be $225.00 , if the patient contacts [employer] at [phone] within 7 days of applying the monitor to make payment arrangements.  If the patient does not contact [employer] within 7 days of applying the monitor, the cost of the cardiac event monitor will be $350.00.  Applying the monitor  Remove cell phone from case and turn it on. The cell phone works as IT consultant and needs to be within [employer] of you at all times. The cell phone will need to  be charged on a daily basis. We recommend you plug the cell phone into the enclosed charger at your bedside table every night.  Monitor batteries: You will receive two monitor batteries labelled #1 and #2. These are your recorders. Plug battery #2 onto the second connection on the enclosed charger. Keep one  battery on the charger at all times. This will keep the monitor battery deactivated. It will also keep it fully charged for when you need to switch your monitor batteries. A small light will be blinking on the battery emblem when it is charging. The light on the battery emblem will remain on when the battery is fully charged.  Open package of a Monitor strip. Insert battery #1 into black hood on strip and gently squeeze monitor battery onto connection as indicated in instruction booklet. Set aside while preparing skin.  Choose location for your strip, vertical or horizontal, as indicated in the instruction booklet. Shave to remove all hair from location. There cannot be any lotions, oils, powders, or colognes on skin where monitor is to be applied. Wipe skin clean with enclosed Saline wipe. Dry skin completely.  Peel paper labeled #1 off the back of the Monitor strip exposing the adhesive. Place the monitor on the chest in the vertical or horizontal position shown in the instruction booklet. One arrow on the monitor strip must be pointing upward. Carefully remove paper labeled #2, attaching remainder of strip to your skin. Try not to create any folds or wrinkles in the strip as you apply it.  Firmly press and release the circle in the center of the monitor battery. You will hear a small beep. This is turning the monitor battery on. The heart emblem on the monitor battery will light up every 5 seconds if the monitor battery in turned on and connected to the patient securely. Do not push and hold the circle down as this turns the monitor battery off. The cell phone will locate the monitor battery. A screen will appear on the cell phone checking the connection of your monitor strip. This may read poor connection initially but change to good connection within the next minute. Once your monitor accepts the connection you will hear a series of 3 beeps followed by a climbing crescendo of beeps. A  screen will appear on the cell phone showing the two monitor strip placement options. Touch the picture that demonstrates where you applied the monitor strip.  Your monitor strip and battery are waterproof. You are able to shower, bathe, or swim with the monitor on. They just ask you do not submerge deeper than 3 feet underwater. We recommend removing the monitor if you are swimming in a lake, river, or ocean.  Your monitor battery will need to be switched to a fully charged monitor battery approximately once a week. The cell phone will alert you of an action which needs to be made.  On the cell phone, tap for details to reveal connection status, monitor battery status, and cell phone battery status. The green dots indicates your monitor is in good status. A red dot indicates there is something that needs your attention.  To record a symptom, click the circle on the monitor battery. In 30-60 seconds a list of symptoms will appear on the cell phone. Select your symptom and tap save. Your monitor will record a sustained or significant arrhythmia regardless of you clicking the button. Some patients do not feel the heart rhythm irregularities. [employer] will notify us  of any serious  or critical events.  Refer to instruction booklet for instructions on switching batteries, changing strips, the Do not disturb or Pause features, or any additional questions.  Call [employer] at [phone], to confirm your monitor is transmitting and record your baseline. They will answer any questions you may have regarding the monitor instructions at that time.  Returning the monitor to [employer]  Place all equipment back into blue box. Peel off strip of paper to expose adhesive and close box securely. There is a prepaid UPS shipping label on this box. Drop in a UPS drop box, or at a UPS facility like [employer]. You may also contact [employer] to arrange UPS to pick up monitor package at your home.

## 2024-01-28 DIAGNOSIS — R42 Dizziness and giddiness: Secondary | ICD-10-CM | POA: Diagnosis not present

## 2024-01-28 DIAGNOSIS — I471 Supraventricular tachycardia, unspecified: Secondary | ICD-10-CM | POA: Diagnosis not present

## 2024-01-31 ENCOUNTER — Telehealth (HOSPITAL_BASED_OUTPATIENT_CLINIC_OR_DEPARTMENT_OTHER): Payer: Self-pay | Admitting: Cardiovascular Disease

## 2024-01-31 DIAGNOSIS — I455 Other specified heart block: Secondary | ICD-10-CM

## 2024-01-31 NOTE — Telephone Encounter (Signed)
Advised patient, verbalized understanding  Referral placed  

## 2024-01-31 NOTE — Telephone Encounter (Signed)
 Dr Raford reviewed report She is unable to determine if Aflutter or not however with the pauses she would like for the patient to see EP  Left message to call back

## 2024-01-31 NOTE — Telephone Encounter (Signed)
 Transferred from call center as a stat call   Jacquelyn from [facility]- abnormal EKG readings, auto trigger 7/4 0958am. Atrial flutter w/ Multiple pauses longest pause 3.2 secs, HR ranging 50-80bpm. Duration 110 seconds. Report to be faxed for review.   Discussed with Newell Birk, LPN- fax already received and discussed with Dr. Raford- recommendation is to refer to EP

## 2024-01-31 NOTE — Telephone Encounter (Signed)
 Jacquelyn with [facility] is calling to report a critical EKG. Call transferred to triage.

## 2024-02-07 ENCOUNTER — Telehealth: Payer: Self-pay | Admitting: Cardiovascular Disease

## 2024-02-07 NOTE — Telephone Encounter (Signed)
 Haley [name] calling to report urgent EKG results.

## 2024-02-07 NOTE — Telephone Encounter (Signed)
 Spoke with Haley [name] from [facility], she reports this morning at 4:39 AM the heart monitor auto triggered showing a pause of 3.3 seconds and atrial fibrillation with HR of 70 bpm.  Called and spoke with patient, she denies any symptoms. She states she was sleeping at the time of the alert.   Patient has a wedding to attend on 7/26 and would like to know if she can remove the heart monitor and reapply after the wedding.

## 2024-02-08 NOTE — Telephone Encounter (Signed)
 Monitor strip faxed to [facility] office. Dr. Raford patient.

## 2024-02-08 NOTE — Telephone Encounter (Signed)
 Reviewed by Dr Lonni who is not able to read strip Reached out to [facility] W/Katie S with monitors, they are looking at getting daily reports Made Dr Lonni aware via secure chat   If that is the true burden of afib, then we need to bring her back in to discuss anticoagulation as soon as we can   Will await reports from monitor team

## 2024-02-09 NOTE — Telephone Encounter (Signed)
 Dr Lonni reviewed daily report from 02/07/2024 Secondary to poor quality of strips she is unable to determine exact rhythm, continue to monitor and keep appointment with Dr Kennyth 02/29/2024 as scheduled

## 2024-02-14 ENCOUNTER — Telehealth: Payer: Self-pay | Admitting: Internal Medicine

## 2024-02-14 NOTE — Telephone Encounter (Signed)
 [facility] report faxed to the office. Independently reviewed SB 39 bpm with PAC. No significant pause. Available under media tab for review. As during sleep, not of concern. Will route to Dr. Kennyth as RICK as she has upcoming visit with him.  Maddon Horton S Donnielle Addison, NP

## 2024-02-14 NOTE — Telephone Encounter (Signed)
 Contacted by [facility] for heart monitoring.  Patient reportedly had sinus bradycardia down 39 bpm that lasted 30 secs at 3 am.  Patient likely sleeping.

## 2024-02-16 ENCOUNTER — Telehealth (HOSPITAL_BASED_OUTPATIENT_CLINIC_OR_DEPARTMENT_OTHER): Payer: Self-pay | Admitting: Cardiovascular Disease

## 2024-02-16 NOTE — Telephone Encounter (Signed)
   Cardiac Monitor Alert  Date of alert:  02/16/2024   Patient Name: Haley [name]  DOB: [birth year]  MRN: [id number]   [facility] Cardiologist: Annabella Scarce, MD  [facility] EP:  None    Monitor Information: Cardiac Event Monitor [[employer]]  Reason:  ? Atrial fibrillation Ordering provider:  Dr Scarce   Alert Pause(s) - Longest:  3.2 seconds occurred at 4:15 am This is the 1st alert for this rhythm.   Next Cardiology Appointment   Date:  02/29/24  Provider:  Dr Kennyth  The patient could NOT be reached by telephone today.  Left message for patient to call Arrhythmia, symptoms and history reviewed with .   Plan:      Adrien Conquest, RN  02/16/2024 12:17 PM

## 2024-02-16 NOTE — Telephone Encounter (Signed)
 Dr Raford reviewed and will continue to monitor, keep appointment with Dr Kennyth as scheduled

## 2024-02-16 NOTE — Telephone Encounter (Signed)
 Spoke with pt, she was asleep when she had the pause and she is not having problems today.

## 2024-02-16 NOTE — Telephone Encounter (Signed)
 Patient returned RN's call.

## 2024-02-16 NOTE — Telephone Encounter (Signed)
  Jasmin - [facility] to give critical EKG results. While transferring to triage, caller hang up

## 2024-02-18 ENCOUNTER — Telehealth: Payer: Self-pay

## 2024-02-18 ENCOUNTER — Telehealth: Payer: Self-pay | Admitting: Student in an Organized Health Care Education/Training Program

## 2024-02-18 NOTE — Telephone Encounter (Signed)
 Received a notification from [facility] stating that Ms. Poblete went into sinus bradycardia at a rate of 39 bpm last night.  Given this was consistent with prior episodes identified using the monitor have been documented in the chart I did called her.  Ambulatory monitor was ordered to evaluate for SVT.  Merlene Blood, MD MS  Cardiology [facility]

## 2024-02-18 NOTE — Telephone Encounter (Signed)
 Left message. Pt may D/C use of monitor and send back per Dr Raford. Continue with appointment with Dr Benjaman

## 2024-02-21 ENCOUNTER — Encounter: Payer: Self-pay | Admitting: Cardiovascular Disease

## 2024-02-24 ENCOUNTER — Ambulatory Visit: Attending: Cardiovascular Disease

## 2024-02-24 ENCOUNTER — Telehealth: Payer: Self-pay | Admitting: Cardiovascular Disease

## 2024-02-24 DIAGNOSIS — E7800 Pure hypercholesterolemia, unspecified: Secondary | ICD-10-CM | POA: Diagnosis not present

## 2024-02-24 DIAGNOSIS — I471 Supraventricular tachycardia, unspecified: Secondary | ICD-10-CM

## 2024-02-24 DIAGNOSIS — R42 Dizziness and giddiness: Secondary | ICD-10-CM

## 2024-02-24 DIAGNOSIS — I10 Essential (primary) hypertension: Secondary | ICD-10-CM | POA: Diagnosis not present

## 2024-02-24 DIAGNOSIS — I499 Cardiac arrhythmia, unspecified: Secondary | ICD-10-CM

## 2024-02-24 NOTE — Telephone Encounter (Signed)
 Reviewed by Dr. Kennyth. [place] 1 and 3 second pause during sleep. Will be addressed at upcoming EP visit. No changes needed at this time. Will remove from triage basket.   Janan Bogie S Zakkiyya Barno, NP

## 2024-02-24 NOTE — Telephone Encounter (Signed)
 Unable to reach anyone at [facility] for review and fax report- will try again later.

## 2024-02-24 NOTE — Telephone Encounter (Signed)
 Calling with abnormal EKG. Please advise

## 2024-02-24 NOTE — Telephone Encounter (Signed)
 [employer] report available under 'media' tabl with notation of 'atrial fibrillation with 3 second pause' occurred 02/24/24 at 0534. As likely during sleep, pause not of concern as she has EP follow up.   Not presently on OAC for atrial fib. Does potentially look like heart block surrounding pause rather than atrial fib. Will route to Dr. Kennyth for his input.   Haley Rhines S Ronesha Heenan, NP

## 2024-02-28 NOTE — Progress Notes (Addendum)
 Electrophysiology Office Note:   Date:  02/29/2024  ID:  Haley [name], Haley [name] 1934-04-20, MRN [id number]  Primary Cardiologist: Annabella Scarce, MD Electrophysiologist: Fonda Kitty, MD      History of Present Illness:   Haley [name] is a 89 y.o. female with h/o hypertension, hyperlipidemia, mild carotid stenosis, SVT, aortic atherosclerosis, ascending and descending thoracic aortic aneurysm, splenic artery aneurysm, lung cancer status post wedge resection, remote breast cancer, hiatal hernia, and GERD who is being seen today for abnormal heart monitor.  Discussed the use of AI scribe software for clinical note transcription with the patient, who gave verbal consent to proceed.  History of Present Illness Haley [name] is an 89 year old female who presents for evaluation of heart rhythm concerns. She was referred by Dr. Scarce for evaluation of heart rhythm concerns after a heart monitor was ordered. She has a history of bradycardia during sleep, with heart rates. She is currently on verapamil , for palpitations.  She previously experienced palpitations described as 'racing, fluttering, skipping', but these symptoms have resolved with verapamil , which has been effective in suppressing these episodes. She experiences occasional dizziness upon getting up in the morning, but denies any episodes throughout the day. She denies any episodes of syncope or exertional intolerance.   Review of systems complete and found to be negative unless listed in HPI.   EP Information / Studies Reviewed:    EKG is ordered today. Personal review as below.  EKG Interpretation Date/Time:  Tuesday February 29 2024 09:57:55 EDT Ventricular Rate:  85 PR Interval:  248 QRS Duration:  88 QT Interval:  394 QTC Calculation: 468 R Axis:   65  Text Interpretation: Sinus rhythm with marked sinus arrhythmia with 1st degree A-V block with Premature atrial complexes Septal infarct (cited on or before 29-Feb-2024) When  compared with ECG of 27-Jul-2023 15:39, Vent. rate has increased BY  29 BPM Questionable change in QRS axis Nonspecific T wave abnormality now evident in Lateral leads Confirmed by Kitty Fonda [phone]) on 02/29/2024 9:06:59 PM   Echo 02/2021:   1. Left ventricular ejection fraction, by estimation, is 60 to 65%. The  left ventricle has normal function. The left ventricle has no regional  wall motion abnormalities. Left ventricular diastolic parameters are  indeterminate.   2. Right ventricular systolic function is normal. The right ventricular  size is normal.   3. Mild mitral valve regurgitation.   4. The aortic valve is tricuspid. Aortic valve regurgitation is moderate.  Mild aortic valve sclerosis is present, with no evidence of aortic valve  stenosis.   5. Aortic dilatation noted. There is mild dilatation of the aortic root,  measuring 43 mm.   6. The inferior vena cava is normal in size with greater than 50%  respiratory variability, suggesting right atrial pressure of 3 mmHg.    Physical Exam:   VS:  BP 111/62   Pulse 77   Ht 5' 5.5 (1.664 m)   Wt 119 lb 14.4 oz (54.4 kg)   SpO2 98%   BMI 19.65 kg/m    Wt Readings from Last 3 Encounters:  02/29/24 119 lb 14.4 oz (54.4 kg)  01/20/24 119 lb (54 kg)  11/23/23 121 lb (54.9 kg)     GEN: Well nourished, well developed in no acute distress NECK: No JVD CARDIAC: Normal rate, irregular rhythm RESPIRATORY:  Clear to auscultation without rales, wheezing or rhonchi  ABDOMEN: Soft, non-tender, non-distended EXTREMITIES:  No edema; No deformity   ASSESSMENT AND  PLAN:    # Palpitations # Frequent PACs, multifocal # Nocturnal bradycardia - Patient recently wore Zio monitor which shows frequent, multifocal PACs.  I have seen no obvious atrial fibrillation or flutter.  Questionable episodes described as flutter by monitor alerts are described as lasting only seconds and are likely short bursts of atrial tachycardia, similar to what  has been observed on her 12 lead ECGs.  She has derived symptomatic benefit from diltiazem, but she has had some nocturnal bradycardia and pauses, which is primarily secondary to blocked PACs. I see no evidence of AV block that would warrant pacemaker. No known history of symptomatic bradycardia. We will continue her on verapamil  but decrease to 120 mg once daily. - I have asked her primary cardiologist, Dr. Raford, to forward me the final report from her 30-day monitor to ensure that there are no changes from my assessment above.   #Hypertension -At goal today.  Recommend checking blood pressures 1-2 times per week at home and recording the values.  Recommend bringing these recordings to the primary care physician.   Follow up with general cardiology.  Signed, Fonda Kitty, MD

## 2024-02-29 ENCOUNTER — Telehealth: Payer: Self-pay | Admitting: Cardiovascular Disease

## 2024-02-29 ENCOUNTER — Encounter: Payer: Self-pay | Admitting: Cardiology

## 2024-02-29 ENCOUNTER — Ambulatory Visit: Attending: Cardiovascular Disease | Admitting: Cardiology

## 2024-02-29 VITALS — BP 111/62 | HR 77 | Ht 65.5 in | Wt 119.9 lb

## 2024-02-29 DIAGNOSIS — R002 Palpitations: Secondary | ICD-10-CM

## 2024-02-29 DIAGNOSIS — I4719 Other supraventricular tachycardia: Secondary | ICD-10-CM

## 2024-02-29 DIAGNOSIS — I491 Atrial premature depolarization: Secondary | ICD-10-CM

## 2024-02-29 DIAGNOSIS — R001 Bradycardia, unspecified: Secondary | ICD-10-CM | POA: Diagnosis not present

## 2024-02-29 MED ORDER — VERAPAMIL HCL ER 120 MG PO TBCR: 120.0000 mg | EXTENDED_RELEASE_TABLET | Freq: Every day | ORAL | 3 refills | Status: AC

## 2024-02-29 NOTE — Telephone Encounter (Signed)
   Cardiac Monitor Alert  Date of alert:  02/29/2024  Patient Name: Haley [name]  DOB: Jun 02, 1934  MRN: [id number]   [facility] Cardiologist: Annabella Scarce, MD   [facility] EP:  Fonda Kitty, MD    Monitor Information: Cardiac Event Monitor [[employer]]  Reason:  conversion pause of 3.5 seconds/atrial flutter on 02/26/24 05:48:10 EDT Ordering provider:  Annabella Scarce, MD { Alert Atrial Fibrillation/Flutter This is the 2nd alert for this rhythm.  The patient has no hx of Atrial Fibrillation/Flutter.  The patient is not currently on anticoagulation.  Next Cardiology Appointment Date:  02/29/2024  Provider:  Kitty, MD Pt seen by Kitty today 8/5 Arrhythmia, symptoms and history reviewed with Dr. Kitty.  Plan:  Continue to monitor. No changes at this timeStrip will be uploaded to the chart.  Aleck Bill, RN 02/29/24

## 2024-02-29 NOTE — Patient Instructions (Signed)
 Medication Instructions:  Your physician has recommended you make the following change in your medication:  1) DECREASE verapamil  to 120 mg once daily   *If you need a refill on your cardiac medications before your next appointment, please call your pharmacy*  Follow-Up: At [facility], you and your health needs are our priority.  As part of our continuing mission to provide you with exceptional heart care, our providers are all part of one team.  This team includes your primary Cardiologist (physician) and Advanced Practice Providers or APPs (Physician Assistants and Nurse Practitioners) who all work together to provide you with the care you need, when you need it.  Your next appointment:   As needed with Dr. Kennyth   As scheduled with Dr. Raford

## 2024-02-29 NOTE — Telephone Encounter (Signed)
 Haley [name] with [facility] calling to report critical EKG results

## 2024-02-29 NOTE — Telephone Encounter (Signed)
 Error

## 2024-03-03 ENCOUNTER — Encounter: Payer: Self-pay | Admitting: Cardiovascular Disease

## 2024-03-17 DIAGNOSIS — I471 Supraventricular tachycardia, unspecified: Secondary | ICD-10-CM | POA: Diagnosis not present

## 2024-03-17 DIAGNOSIS — I499 Cardiac arrhythmia, unspecified: Secondary | ICD-10-CM

## 2024-03-17 DIAGNOSIS — R42 Dizziness and giddiness: Secondary | ICD-10-CM | POA: Diagnosis not present

## 2024-03-26 DIAGNOSIS — I10 Essential (primary) hypertension: Secondary | ICD-10-CM | POA: Diagnosis not present

## 2024-03-26 DIAGNOSIS — E7800 Pure hypercholesterolemia, unspecified: Secondary | ICD-10-CM | POA: Diagnosis not present

## 2024-04-02 ENCOUNTER — Ambulatory Visit: Payer: Self-pay | Admitting: Cardiology

## 2024-04-12 ENCOUNTER — Other Ambulatory Visit: Payer: Self-pay | Admitting: Thoracic Surgery (Cardiothoracic Vascular Surgery)

## 2024-04-12 DIAGNOSIS — R918 Other nonspecific abnormal finding of lung field: Secondary | ICD-10-CM

## 2024-04-12 DIAGNOSIS — I7121 Aneurysm of the ascending aorta, without rupture: Secondary | ICD-10-CM

## 2024-04-25 DIAGNOSIS — E7800 Pure hypercholesterolemia, unspecified: Secondary | ICD-10-CM | POA: Diagnosis not present

## 2024-04-25 DIAGNOSIS — I10 Essential (primary) hypertension: Secondary | ICD-10-CM | POA: Diagnosis not present

## 2024-05-03 ENCOUNTER — Telehealth (HOSPITAL_BASED_OUTPATIENT_CLINIC_OR_DEPARTMENT_OTHER): Payer: Self-pay | Admitting: Cardiovascular Disease

## 2024-05-03 NOTE — Progress Notes (Unsigned)
 Cardiology Office Note   Date:  05/04/2024  ID:  Haley, [name] [birth year], MRN [id number] PCP: Dayna Motto, DO  [facility] Providers Cardiologist:  Annabella Scarce, MD Electrophysiologist:  Fonda Kitty, MD     PMH Hypertension Hyperlipidemia Mild carotid stenosis SVT Aortic atherosclerosis Ascending and descending thoracic aortic aneurysm Splenic artery aneurysm Lung cancer s/p wedge resection Breast cancer  Initially seen by Dr. Burnard for SVT treated with verapamil  and beta-blockers.  Monitor in 2019 revealed isolated PAC and PVCs with no significant arrhythmias.  On 1 occasion she had taken more medicine than prescribed which resulted in bradycardia.  Echo 02/2021 revealed LVEF 60 to 65% with indeterminate diastolic function, mild aortic valve sclerosis without stenosis.  Chest CT 03/2022 revealed stable 4.6 cm aneurysm of ascending aorta.  Seen by Dr. Scarce 06/15/2022 due to location.  She was on losartan  and verapamil  with well-controlled BP.  Metoprolol  was stopped due to bradycardia.  Cardiac monitor revealed 1595 episodes of SVT with longest lasting 17.9 seconds, 2.4% PAC burden.  Minimum HR was 39 bpm.  She was offered amiodarone but did not want to start another medication.  At follow-up visit 11/2022 she was feeling better and was participating in prep exercise program.  Seen by Dr. Kerrin 10/2023 with stable aortic aneurysm.  Seen by Dr. Scarce on 01/20/2024.  She reported dizziness when getting up or stepping up which resolves after sitting for a short period.  She continues to see heart rate fluctuations on home monitor from 40s to 120s.  She remained active caring for her husband and walking for exercise, although not as regularly recently.  Referred to EP and seen by Dr. Kitty on 02/29/2024.  She has a history of bradycardia during sleep and was currently on verapamil  for palpitations.  She described palpitations as racing, fluttering,  skipping but these symptoms resolved with verapamil .  Cardiac monitor revealed frequent multifocal PACs.  No evidence of atrial fibs or flutter.  There were short bursts of atrial tachycardia.  She had symptomatic benefit from diltiazem but had nocturnal bradycardia and pauses, which is primarily secondary to blocked PACs.  No evidence of AV block that would warrant pacemaker.  Verapamil  was reduced to 120 mg daily.  History of Present Illness Discussed the use of AI scribe software for clinical note transcription with the patient, who gave verbal consent to proceed.  History of Present Illness Haley [name] is a very pleasant 89 year old female with atrial tachycardia and bradycardia who presents with concerns about heart rate fluctuations. She experiences heart rate fluctuations with home monitor readings ranging from 31 to 141 beats per minute, consistent with previous findings on cardiac monitor completed 03/17/24. Occasionally experiences lightheadedness, particularly in the mornings when she wakes up. Symptoms resolve with sitting up for a few minutes. No presyncope or syncope. She takes verapamil  at night before going to bed. She acknowledges recent dose reduction due to heart rate pauses. She remains active with no symptoms of chest pain, shortness of breath, orthopnea, PND, palpitations, edema.  ROS: See HPI  Studies Reviewed EKG Interpretation Date/Time:  Thursday May 04 2024 10:18:34 EDT Ventricular Rate:  106 PR Interval:  272 QRS Duration:  88 QT Interval:  372 QTC Calculation: 494 R Axis:   4  Text Interpretation: Sinus tachycardia with 1st degree A-V block with Premature supraventricular complexes Septal infarct (cited on or before 29-Feb-2024) When compared with ECG of 29-Feb-2024 09:57, Questionable change in QRS axis Nonspecific T wave  abnormality now evident in Inferior leads Confirmed by Percy Browning [phone]) on 05/04/2024 10:28:52 AM     No results found for:  LIPOA  Risk Assessment/Calculations           Physical Exam VS:  BP 108/72   Pulse 61   Ht 5' 5.5 (1.664 m)   Wt 118 lb 8 oz (53.8 kg)   SpO2 95%   BMI 19.42 kg/m    Wt Readings from Last 3 Encounters:  05/04/24 118 lb 8 oz (53.8 kg)  02/29/24 119 lb 14.4 oz (54.4 kg)  01/20/24 119 lb (54 kg)    GEN: Well nourished, well developed in no acute distress NECK: No JVD; No carotid bruits CARDIAC: RRR, no murmurs, rubs, gallops RESPIRATORY:  Clear to auscultation without rales, wheezing or rhonchi  ABDOMEN: Soft, non-tender, non-distended EXTREMITIES:  No edema; No deformity    Assessment & Plan Cardiac arrhythmias  Bradycardia  Atrial tachycardia Frequent PACs   She is here today due to concerns on home monitor revealing heart rate fluctuations between 31 and 141 bpm.  EKG today reveals sinus tachycardia at 106 bpm, first-degree AV block, frequent PACs.  Cardiac monitor completed 03/17/2024 revealed dominant underlying rhythm sinus rhythm with average HR 65 bpm, HR range 31 to 141 bpm, pauses up to 4 seconds occurring in early morning hours, long first-degree AV block, frequent PACs, blocked PACs.  Seen by EP, Dr. Kennyth, on 02/29/2024 and verapamil  was reduced to 120 mg daily due to significant bradycardia and pauses.  She is overall asymptomatic but experiences lightheadedness when waking up in the morning which resolves after sitting for a few minutes. Lengthy discussion about symptoms and when she should be concerned. - Guidelines for monitoring provided -Report if heart rate does not increase with activity or remains excessively low or high for a lengthy tim - Report any new symptoms such as increased lightheadedness, fatigue, presyncope or syncope - Continue verapamil  120 mg daily - Consider follow-up with EP if symptoms worsen   Aortic valve disease History of aortic dilatation.  AI moderate, mild aortic valve sclerosis with no evidence of stenosis, normal LVEF on echo  02/2021. She is asymptomatic. No significant murmur noted on exam.   - Arrange for an echocardiogram before appointment with Dr. Raford in December  -Monitor for and report symptoms concerning for worsening valve function  Thoracic aortic dilatation Moderate ascending and descending thoracic aneurysm.  Stable findings on CT 11/16/2023.  She is followed by [facility]. She is scheduled for repeat CT 05/16/24. No acute concerns today.  -Continue regular follow-up with [facility]         Dispo: Keep your December appointment with Dr. Raford  Signed, Browning Percy, NP-C

## 2024-05-03 NOTE — Telephone Encounter (Addendum)
 Spoke with patient who stated during the night HR has gone up and phone alerted her saying possible afib  HR fluctuated 10/6 from 38-124   Is able to take EKG via watch and will try to capture some if has any episodes  Does not feel episodes but does wake up in am feeling lightheaded   Scheduled visit with Rosaline RAMAN NP for tomorrow

## 2024-05-03 NOTE — Telephone Encounter (Signed)
  Patient c/o Palpitations:  STAT if patient reporting lightheadedness, shortness of breath, or chest pain  How long have you had palpitations/irregular HR/ Afib? Are you having the symptoms now? No   Are you currently experiencing lightheadedness, SOB or CP? No   Do you have a history of afib (atrial fibrillation) or irregular heart rhythm? Yes   Have you checked your BP or HR? (document readings if available): 38-124 and 40-117 HR  Are you experiencing any other symptoms? In the morning feels lightheaded   Patient would like to get Dr. Skeeter recommendation. Last night her watch woke her up that she's on afib

## 2024-05-04 ENCOUNTER — Ambulatory Visit (HOSPITAL_BASED_OUTPATIENT_CLINIC_OR_DEPARTMENT_OTHER): Admitting: Nurse Practitioner

## 2024-05-04 ENCOUNTER — Encounter (HOSPITAL_BASED_OUTPATIENT_CLINIC_OR_DEPARTMENT_OTHER): Payer: Self-pay | Admitting: Nurse Practitioner

## 2024-05-04 VITALS — BP 108/72 | HR 61 | Ht 65.5 in | Wt 118.5 lb

## 2024-05-04 DIAGNOSIS — Z09 Encounter for follow-up examination after completed treatment for conditions other than malignant neoplasm: Secondary | ICD-10-CM | POA: Diagnosis not present

## 2024-05-04 DIAGNOSIS — I4719 Other supraventricular tachycardia: Secondary | ICD-10-CM | POA: Diagnosis not present

## 2024-05-04 DIAGNOSIS — I7121 Aneurysm of the ascending aorta, without rupture: Secondary | ICD-10-CM

## 2024-05-04 DIAGNOSIS — I499 Cardiac arrhythmia, unspecified: Secondary | ICD-10-CM | POA: Diagnosis not present

## 2024-05-04 DIAGNOSIS — I351 Nonrheumatic aortic (valve) insufficiency: Secondary | ICD-10-CM

## 2024-05-04 DIAGNOSIS — I491 Atrial premature depolarization: Secondary | ICD-10-CM | POA: Diagnosis not present

## 2024-05-04 DIAGNOSIS — R001 Bradycardia, unspecified: Secondary | ICD-10-CM | POA: Diagnosis not present

## 2024-05-04 NOTE — Patient Instructions (Addendum)
 Call us  if any of the following occur:  You start to feel more lightheaded or dizzy or you feel like you might pass out or do pass out. Your heart rate stays below 50 when you are up moving around Your heart rate stays above 130 when you have been resting for more than 10 minutes  Medication Instructions:  Your physician recommends that you continue on your current medications as directed. Please refer to the Current Medication list given to you today.  *If you need a refill on your cardiac medications before your next appointment, please call your pharmacy*  Lab Work: None Ordered  Testing/Procedures: Your physician has requested that you have an echocardiogram. Echocardiography is a painless test that uses sound waves to create images of your heart. It provides your doctor with information about the size and shape of your heart and how well your heart's chambers and valves are working. This procedure takes approximately one hour. There are no restrictions for this procedure. Please do NOT wear cologne, perfume, aftershave, or lotions (deodorant is allowed). Please arrive 15 minutes prior to your appointment time.  Please note: We ask at that you not bring children with you during ultrasound (echo/ vascular) testing. Due to room size and safety concerns, children are not allowed in the ultrasound rooms during exams. Our front office staff cannot provide observation of children in our lobby area while testing is being conducted. An adult accompanying a patient to their appointment will only be allowed in the ultrasound room at the discretion of the ultrasound technician under special circumstances. We apologize for any inconvenience.  To be done about a week prior to follow up in December   Follow-Up: At [facility], you and your health needs are our priority.  As part of our continuing mission to provide you with exceptional heart care, our providers are all part of one team.  This  team includes your primary Cardiologist (physician) and Advanced Practice Providers or APPs (Physician Assistants and Nurse Practitioners) who all work together to provide you with the care you need, when you need it.  Your next appointment:   Keep your appointment on 07/21/24 2 month(s) with Dr. Raford  Provider:   Annabella Raford, MD    We recommend signing up for the patient portal called MyChart.  Sign up information is provided on this After Visit Summary.  MyChart is used to connect with patients for Virtual Visits (Telemedicine).  Patients are able to view lab/test results, encounter notes, upcoming appointments, etc.  Non-urgent messages can be sent to your provider as well.   To learn more about what you can do with MyChart, go to [email or web address].

## 2024-05-16 ENCOUNTER — Ambulatory Visit (HOSPITAL_COMMUNITY)
Admission: RE | Admit: 2024-05-16 | Discharge: 2024-05-16 | Disposition: A | Discharge status: Home / Self Care | Source: Ambulatory Visit | Attending: Cardiology | Admitting: Cardiology

## 2024-05-16 DIAGNOSIS — R918 Other nonspecific abnormal finding of lung field: Secondary | ICD-10-CM | POA: Insufficient documentation

## 2024-05-16 DIAGNOSIS — I7121 Aneurysm of the ascending aorta, without rupture: Secondary | ICD-10-CM | POA: Insufficient documentation

## 2024-05-16 DIAGNOSIS — I700 Atherosclerosis of aorta: Secondary | ICD-10-CM | POA: Diagnosis not present

## 2024-05-23 ENCOUNTER — Other Ambulatory Visit: Payer: Self-pay | Admitting: Thoracic Surgery (Cardiothoracic Vascular Surgery)

## 2024-05-23 ENCOUNTER — Ambulatory Visit
Attending: Thoracic Surgery (Cardiothoracic Vascular Surgery) | Admitting: Thoracic Surgery (Cardiothoracic Vascular Surgery)

## 2024-05-23 ENCOUNTER — Encounter: Payer: Self-pay | Admitting: Thoracic Surgery (Cardiothoracic Vascular Surgery)

## 2024-05-23 VITALS — BP 138/83 | HR 57 | Resp 18 | Ht 65.0 in | Wt 122.0 lb

## 2024-05-23 DIAGNOSIS — I7121 Aneurysm of the ascending aorta, without rupture: Secondary | ICD-10-CM | POA: Diagnosis not present

## 2024-05-23 DIAGNOSIS — R918 Other nonspecific abnormal finding of lung field: Secondary | ICD-10-CM

## 2024-05-23 NOTE — Progress Notes (Signed)
 [address], [place] [address]             [phone]   HPI: [name] returns for follow-up of her lung nodules and ascending aneurysm.  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  I have been following her for an aneurysm since 2014.  She is also had a complex ground glass opacity in her right upper lobe since 2020.  We have discussed biopsy on previous occasions but she opted for radiographic follow-up.  I last saw her in April 2025.  Her aneurysm was stable as were her lung findings.  She says she has had some cough and congestion recently.  No fevers or chills.  Patient Active Problem List   Diagnosis Date Noted   Viral upper respiratory tract infection 04/27/2016   Hyperlipidemia LDL goal <70 11/08/2014   Ascending aortic aneurysm 05/16/2013   Lung cancer, upper lobe (HCC) 05/16/2013   Paroxysmal SVT (supraventricular tachycardia) 04/03/2013   Thoracic aortic aneurysm without rupture 08/29/2012   Pulmonary nodules 03/20/2011   GERD (gastroesophageal reflux disease) 03/17/2011   Esophageal motility disorder 03/17/2011   Family history of malignant neoplasm of gastrointestinal tract 03/17/2011   CHEST PAIN 03/19/2009   CARCINOMA, BREAST 08/13/2007   HYPERCHOLESTEROLEMIA 08/13/2007   Essential hypertension 08/13/2007   IRREGULAR HEART RATE 08/13/2007   PNEUMONIA 08/13/2007   BRONCHITIS 08/13/2007   GERD 08/13/2007   IRRITABLE BOWEL SYNDROME 08/13/2007   UTI'S, RECURRENT 08/13/2007   POSTMENOPAUSAL STATUS 08/13/2007   COUGH, CHRONIC 08/13/2007   CHEST PAIN, NON-CARDIAC 08/13/2007   GASTRITIS, CHRONIC 03/12/2006    Current Outpatient Medications  Medication Sig Dispense Refill   atorvastatin  (LIPITOR)  40 MG tablet TAKE 1 TABLET EVERY DAY 90 tablet 1   b complex vitamins tablet Take 1 tablet by mouth daily.     Biotin 89999 MCG TABS Take 1 tablet by mouth daily.     co-enzyme Q-10 30 MG capsule Take 300 mg by mouth daily.     losartan  (COZAAR ) 50 MG tablet TAKE 1 TABLET EVERY MORNING AND 1/2 TABLET EVERY EVENING 135 tablet 1   Omega-3 1000 MG CAPS Take by mouth.     OVER THE COUNTER MEDICATION Emme for constipation     Probiotic Product (PROBIOTIC DAILY PO) Take by mouth.     verapamil  (CALAN -SR) 120 MG CR tablet Take 1 tablet (120 mg total) by mouth at bedtime. 90 tablet 3   Vitamin D , Cholecalciferol, 50 MCG (2000 UT) CAPS Take 1 capsule by mouth daily.     azelastine (ASTELIN) 0.1 % nasal spray Place 2 sprays into both nostrils 2 (two) times daily. (Patient not taking: Reported on 05/23/2024)     fluticasone (FLONASE) 50 MCG/ACT nasal spray Place 2 sprays into both nostrils daily. (Patient not taking: Reported on 05/23/2024)     levocetirizine (XYZAL) 5 MG tablet Take 5 mg by mouth daily. (Patient not taking: Reported on 05/23/2024)     No current facility-administered medications for this visit.    Physical Exam BP 138/83 (BP Location: Right Arm)   Pulse (!) 57   Resp 18   Ht 5' 5 (1.651 m)   Wt 122 lb (55.3 kg)   SpO2 100%   BMI 20.74 kg/m  89 year old woman in no acute  distress Alert and oriented x 3 with no focal deficits Lungs clear bilaterally Cardiac slightly irregular, no murmur No peripheral edema  Diagnostic Tests: CT CHEST WITHOUT CONTRAST   TECHNIQUE: Multidetector CT imaging of the chest was performed following the standard protocol without IV contrast.   RADIATION DOSE REDUCTION: This exam was performed according to the departmental dose-optimization program which includes automated exposure control, adjustment of the mA and/or kV according to patient size and/or use of iterative reconstruction technique.   COMPARISON:  Multiple priors including CT  November 16, 2023   FINDINGS: Cardiovascular: Ascending thoracic aortic aneurysm measures 4.9 cm, unchanged. Aortic and branch vessel atherosclerosis. Coronary artery calcifications. Cardiac enlargement. Trace pericardial effusion.   Mediastinum/Nodes: No suspicious thyroid  nodule. No pathologically enlarged mediastinal, hilar or axillary lymph nodes. Patulous esophagus.   Lungs/Pleura: Subsolid right upper lobe spiculated pulmonary nodule on image 20/302 measures 2.1 x 1.1 cm, unchanged but slowly growing and increasing in solid component dating back to at least 2019.   Elongated nodular focus in the right middle lobe on image 70/302 measures 2.2 x 1.0 cm previously 2.1 x 1.1 cm, favored nodular atelectasis.   Prior left upper lobe wedge resection. No new suspicious nodularity along the suture line.   Progressive clustered nodularity in the dependent right-greater-than-left lower lobes. Scattered additional 4 mm or smaller pulmonary nodules are stable over prior examinations.   Upper Abdomen: No acute finding.   Musculoskeletal: Thoracic spondylosis.   IMPRESSION: 1. Subsolid right upper lobe spiculated pulmonary nodule is stable from recent prior examinations but slowly growing and increasing in solid component dating back to at least 2019. Findings are concerning for indolent primary bronchogenic adenocarcinoma. 2. Elongated nodular focus in the right middle lobe measures 2.2 x 1.0 cm previously 2.1 x 1.1 cm, favored nodular atelectasis. 3. Progressive clustered nodularity in the dependent right-greater-than-left lower lobes, likely infectious or inflammatory. 4. Unchanged 4.9 cm aneurysmal dilation of the ascending thoracic aorta.     Electronically Signed   By: Reyes Holder M.D.   On: 05/16/2024 14:38   I personally reviewed the CT images.  There is a significant change in the subsolid right upper lobe nodule.  The size is about the same but on the vessel  suppression images there is much more prominent solid component now than there was previously.  Ascending aneurysm about 4.9 cm.  No significant change.  Impression: Haley [name] is an 89 year old woman with a history of a stage Ia non-small cell carcinoma of the lung, ascending aneurysm, hypertension, hyperlipidemia, supraventricular tachycardia, breast cancer, skin cancer, chronic pancreatitis, reflux, irritable bowel syndrome, and lung nodules.  Ascending aneurysm-stable at about 4.8 to 4.9 cm.  Needs continued semiannual follow-up.  Hypertension-blood pressure is a little high today.  Needs continued monitoring.  Right upper lobe lung nodule-she has had a subsolid nodule in the right upper lobe for several years now.  Has remained stable over time.  However, on today's scan there is a much greater solid component most evident in the vessel suppressed images.  Pretty significant change compared to 6 months ago.  Highly concerning for progression of an adenocarcinoma.  I recommended we do a PET/CT to guide our initial diagnostic workup.  She is in agreement with that.  Not a great operative candidate due to age but would be a candidate for stereotactic radiation.   Plan: PET/CT to guide initial diagnostic workup Return in 2 to 3 weeks to discuss results  Haley JAYSON Millers, MD [facility] [phone]

## 2024-05-26 DIAGNOSIS — I10 Essential (primary) hypertension: Secondary | ICD-10-CM | POA: Diagnosis not present

## 2024-05-26 DIAGNOSIS — E7800 Pure hypercholesterolemia, unspecified: Secondary | ICD-10-CM | POA: Diagnosis not present

## 2024-05-29 ENCOUNTER — Ambulatory Visit (HOSPITAL_COMMUNITY)
Admission: RE | Admit: 2024-05-29 | Discharge: 2024-05-29 | Disposition: A | Discharge status: Home / Self Care | Source: Ambulatory Visit | Attending: Thoracic Surgery (Cardiothoracic Vascular Surgery) | Admitting: Thoracic Surgery (Cardiothoracic Vascular Surgery)

## 2024-05-29 DIAGNOSIS — R918 Other nonspecific abnormal finding of lung field: Secondary | ICD-10-CM | POA: Diagnosis not present

## 2024-05-29 LAB — GLUCOSE, CAPILLARY: Glucose-Capillary: 91 mg/dL (ref 70–99)

## 2024-05-29 MED ORDER — FLUDEOXYGLUCOSE F - 18 (FDG) INJECTION
6.0700 | Freq: Once | INTRAVENOUS | Status: AC
  Administered 2024-05-29: 6.07 via INTRAVENOUS

## 2024-06-06 ENCOUNTER — Ambulatory Visit
Attending: Thoracic Surgery (Cardiothoracic Vascular Surgery) | Admitting: Thoracic Surgery (Cardiothoracic Vascular Surgery)

## 2024-06-06 VITALS — BP 130/76 | HR 87 | Resp 18 | Ht 65.0 in | Wt 121.0 lb

## 2024-06-06 DIAGNOSIS — R918 Other nonspecific abnormal finding of lung field: Secondary | ICD-10-CM

## 2024-06-06 NOTE — Progress Notes (Signed)
 [address] [place] [address]             [id number]      HPI: Haley [name] returns to discuss the results of her PET/CT  Haley [name] is an 89 year old woman with a history of non-small cell carcinoma of the lung, ascending aneurysm, hypertension, hyperlipidemia, supraventricular tachycardia, breast cancer, skin cancer, chronic pancreatitis, reflux, irritable bowel syndrome, and lung nodules.  I did a left upper lobe wedge resection for a ground glass opacity in 2014.  It turned out to be a stage Ia adenocarcinoma.  She has been followed since that time for her lung cancer and her ascending aneurysm.  In 2020 she was noted to have a complex ground glass opacity in her right upper lobe.  We had discussed biopsy on multiple occasions but she always opted for radiographic follow-up.  I saw her a couple of weeks ago and her right upper lobe opacity was stable in overall size but there was much more of a solid component on the vessel suppression images and there had been previously.  I recommended a PET/CT.  Of note her aneurysm was again stable.  She now returns to discuss results of her PET/CT.  Past Medical History:  Diagnosis Date   Arthritis    knees and left hip   Atrophic gastritis without mention of hemorrhage    Breast cancer (HCC)    Bronchitis, not specified as acute or chronic    Cancer (HCC)    Lung cancer   Cardiac dysrhythmia, unspecified    Cataract    Chronic pancreatitis (HCC)    Complication of anesthesia    Cough    Esophageal reflux    Family history of malignant neoplasm of gastrointestinal tract    Fatty liver    Flatulence, eructation, and gas pain    Gastric erosions    [place]   Hiatal hernia    Hoarseness    since virus 2 months agp   Irritable bowel syndrome    Irritable bowel syndrome    LGSIL (low grade squamous intraepithelial dysplasia) 11/15/2014   with biopsy of  benign vaginal cyst   Malignant neoplasm of breast  (female), unspecified site    Skin Cancer legs, face, shoulder, some basal some squamous   Other chest pain    Personal history of radiation therapy    Pneumonia, organism unspecified(486)    PONV (postoperative nausea and vomiting)    with hemorrhoid surgery years ago, no n/v with breast surgery   Pure hypercholesterolemia    Shortness of breath    with Exertion   Sorethroat    since virus 2 months ago   Symptomatic menopausal or female climacteric states    Thoracic aortic aneurysm without rupture 08/29/12   08/29/12 discovered on PET/CT   Tubular adenoma of colon    Unspecified essential hypertension    Urinary tract infection, site not specified    Past Surgical History:  Procedure Laterality Date   BREAST LUMPECTOMY Right 03/23/2003   w/ radiation. no chemo.   BREAST SURGERY     CATARACT EXTRACTION     Bilateral   COLONOSCOPY     EYE SURGERY     HEMORROIDECTOMY     SKIN CANCER EXCISION     VIDEO ASSISTED THORACOSCOPY (VATS)/WEDGE RESECTION Left 09/19/2012   Procedure: LEFT VIDEO ASSISTED THORACOSCOPY (VATS)/LEFT UPPER LOBE WEDGE RESECTION & NODE DISSECTION;  Surgeon: Elspeth JAYSON Millers, MD;  Location:  [facility] OR;  Service: Thoracic;  Laterality: Left;    Current Outpatient Medications  Medication Sig Dispense Refill   atorvastatin  (LIPITOR) 40 MG tablet TAKE 1 TABLET EVERY DAY 90 tablet 1   b complex vitamins tablet Take 1 tablet by mouth daily.     Biotin 89999 MCG TABS Take 1 tablet by mouth daily.     co-enzyme Q-10 30 MG capsule Take 300 mg by mouth daily.     losartan  (COZAAR ) 50 MG tablet TAKE 1 TABLET EVERY MORNING AND 1/2 TABLET EVERY EVENING 135 tablet 1   Omega-3 1000 MG CAPS Take by mouth.     OVER THE COUNTER MEDICATION Emme for constipation     Probiotic Product (PROBIOTIC DAILY PO) Take by mouth.     verapamil  (CALAN -SR) 120 MG CR tablet Take 1 tablet (120 mg total) by mouth at bedtime. 90 tablet 3   Vitamin D , Cholecalciferol, 50 MCG (2000 UT) CAPS Take 1  capsule by mouth daily.     azelastine (ASTELIN) 0.1 % nasal spray Place 2 sprays into both nostrils 2 (two) times daily. (Patient not taking: Reported on 06/06/2024)     fluticasone (FLONASE) 50 MCG/ACT nasal spray Place 2 sprays into both nostrils daily. (Patient not taking: Reported on 06/06/2024)     levocetirizine (XYZAL) 5 MG tablet Take 5 mg by mouth daily. (Patient not taking: Reported on 06/06/2024)     No current facility-administered medications for this visit.    Physical Exam BP 130/76 (BP Location: Left Arm)   Pulse 87   Resp 18   Ht 5' 5 (1.651 m)   Wt 121 lb (54.9 kg)   SpO2 96%   BMI 20.42 kg/m  89 year old woman in no acute distress Alert and oriented x 3, no focal motor deficits No cervical or supraclavicular adenopathy Lungs clear bilaterally Cardiac regular rate and rhythm, no murmur No clubbing, cyanosis or edema  Diagnostic Tests: NUCLEAR MEDICINE PET SKULL BASE TO THIGH   TECHNIQUE: 6.07 mCi F-18 FDG was injected intravenously. Full-ring PET imaging was performed from the skull base to thigh after the radiotracer. CT data was obtained and used for attenuation correction and anatomic localization.   Fasting blood glucose: 91 mg/dl   COMPARISON:  Chest CT May 16, 2024, CT angio chest February 28, 2021, multiple priors dated including chest CT September 19, 2013, PET-CT August 29, 2012   FINDINGS: Mediastinal blood pool activity: SUV max 1.9   Liver activity: SUV max 2.7   NECK: No suspicious lymphadenopathy.   CHEST:   Hypermetabolic nodule in medial aspect of middle lobe measuring with max SUV 8.4, previously measured 2.1 x 1 cm.   Right lung apex subsolid nodule measuring 2.1 x 1.5 cm with max SUV 1.4, previously measured 2 x 1.1 cm. A tendril is extending from nodule to the lateral pleural surface. This nodule was not seen on prior PET-CT from 2014 and gradually enlarging when compared to 2023 (measured 1.3 cm).   Postsurgical  changes from prior resection in left upper lobe. Multiple bronchocentric tree-in-bud micronodules predominantly in right lower lobe below PET resolution, similar to prior CT and new to exams dated back to 2015.   No significant pleural effusion.   Aneurysmal dilation of ascending aorta measuring up to 4.6 cm. Cardiomegaly. Atherosclerotic calcifications of aorta and coronary arteries.   Right breast lumpectomy without suspicious hypermetabolic findings to suggest recurrent malignancy. No FDG avid or enlarged axillary or internal mammary lymphadenopathy.   ABDOMEN/PELVIS: No hypermetabolic lymphadenopathy  or suspicious findings to suggest distant metastasis. Atrophic changes of the pancreas.   SKELETON: Multilevel degenerative changes of the spine. No suspicious osseous lesion.   IMPRESSION: Hypermetabolic right middle lobe nodule concerning for malignancy until proven otherwise (primary versus metastatic) and stable to recent chest CT and gradually enlarging to 2023. Recommend tissue sampling.   Subsolid right apical nodule is stable to prior and mildly FDG avid. Adenocarcinoma spectrum (lepidic variant) can not be excluded and may represent as low FDG avid lesions. This finding is gradually enlarging and appearing more solid compared to prior exams. Wedge resection changes in left upper lobe without suspicious metabolic activity. Additional bronchocentric tree-in-bud micronodules predominantly in right lower lobe, below PET resolution may represent infection/inflammation versus metastasis. Follow-up according to oncologic protocol.   Stable aneurysmal dilation of ascending aorta.   No hypermetabolic finding in the abdomen and pelvis to suggest distant metastatic disease.     Electronically Signed   By: Megan  Zare M.D.   On: 05/31/2024 12:54 I personally reviewed the PET/CT images.  There is subsolid right apical nodule that has mild FDG activity.  Medial right middle  lobe nodule with significant metabolic activity.  No mediastinal or hilar adenopathy.  Impression: Haley [name] is an 89 year old woman with a history of non-small cell carcinoma of the lung, ascending aneurysm, hypertension, hyperlipidemia, supraventricular tachycardia, breast cancer, skin cancer, chronic pancreatitis, reflux, irritable bowel syndrome, and lung nodules.  Right upper lobe nodule-primarily ground glass but now with more density on the vessel suppression images.  Low-grade activity on PET.  Suspect this is a low-grade adenocarcinoma.  Right middle lobe nodule-previously thought to represent inspissated mucus.  That may still be the case but it does have significant metabolic activity on PET.  Also suspicious for a primary bronchogenic carcinoma.  Given her advanced age and other medical issues she is not a great candidate for surgical resection.  She is a candidate for stereotactic radiation.  I recommended that we proceed with a robotic navigational bronchoscopy for diagnostic purposes.  She understands this would be done under general anesthesia on an outpatient basis.  I informed her of the indications, risks, benefits, and alternatives.  She understands the risks include, but not limited to death, MI, DVT, PE, bleeding, pneumothorax, as well as possibility of other unforeseeable complications.   Plan: Robotic bronchoscopy to sample right upper and middle lobe nodules  Elspeth JAYSON Millers, MD [facility] [phone]

## 2024-06-07 ENCOUNTER — Other Ambulatory Visit: Payer: Self-pay | Admitting: *Deleted

## 2024-06-07 ENCOUNTER — Encounter: Payer: Self-pay | Admitting: *Deleted

## 2024-06-07 DIAGNOSIS — R918 Other nonspecific abnormal finding of lung field: Secondary | ICD-10-CM

## 2024-06-14 ENCOUNTER — Other Ambulatory Visit: Payer: Self-pay | Admitting: Cardiovascular Disease

## 2024-06-20 ENCOUNTER — Ambulatory Visit: Admitting: Thoracic Surgery (Cardiothoracic Vascular Surgery)

## 2024-06-25 DIAGNOSIS — E7800 Pure hypercholesterolemia, unspecified: Secondary | ICD-10-CM | POA: Diagnosis not present

## 2024-06-25 DIAGNOSIS — I10 Essential (primary) hypertension: Secondary | ICD-10-CM | POA: Diagnosis not present

## 2024-06-26 ENCOUNTER — Telehealth: Payer: Self-pay | Admitting: Cardiovascular Disease

## 2024-06-26 NOTE — Telephone Encounter (Signed)
 Spoke with patient who is having more episodes of fast HR  Yesterday HR 43-119, this am 33-47, and between 8 to 9 56-149, readings say possible Afib More frequent than when she saw Rosaline in October  Is having lung biopsy Friday   Will forward to Dr Kennyth and Rosaline RAMAN NP for review   Have sent message to scheduling team to see about getting appointment with Dr Kennyth

## 2024-06-26 NOTE — Telephone Encounter (Signed)
 Patient c/o Palpitations:  STAT if patient reporting lightheadedness, shortness of breath, or chest pain  How long have you had palpitations/irregular HR/ Afib? Are you having the symptoms now? A couple of weeks,   Are you currently experiencing lightheadedness, SOB or CP? no  Do you have a history of afib (atrial fibrillation) or irregular heart rhythm? yes  Have you checked your BP or HR? (document readings if available): HR 75-77 this morning 120 per watch   Are you experiencing any other symptoms? No

## 2024-06-26 NOTE — Telephone Encounter (Signed)
 If she is able to send any readings from smart watch, that would be great. Agree with plan to try to get appointment with EP/APP or consider nurse visit for EKG. Unfortunately, given low HR into the 30s and 40s we are unable to increase verapamil  dose. Recommend she let us  know if she has worsening symptoms of lightheadedness, presyncope, syncope.

## 2024-06-26 NOTE — Progress Notes (Signed)
 Surgical Instructions   Your procedure is scheduled on December 5. Report to [facility] Main Entrance A at 5:30 A.M., then check in with the Admitting office. Any questions or running late day of surgery: call [phone]  Questions prior to your surgery date: call [phone], Monday-Friday, 8am-4pm. If you experience any cold or flu symptoms such as cough, fever, chills, shortness of breath, etc. between now and your scheduled surgery, please notify us  at the above number.     Remember:  Do not eat or drink anything after midnight the night before your surgery   Take these medicines the morning of surgery with A SIP OF WATER  atorvastatin  (LIPITOR)   May take these medicines IF NEEDED: None  One week prior to surgery, STOP taking any Aspirin (unless otherwise instructed by your surgeon) Aleve, Naproxen, Ibuprofen, Motrin, Advil, Goody's, BC's, all herbal medications, fish oil, and non-prescription vitamins. This includes your Omega 3 capsules.                      Do NOT Smoke (Tobacco/Vaping) for 24 hours prior to your procedure.  If you use a CPAP at night, you may bring your mask/headgear for your overnight stay.   You will be asked to remove any contacts, glasses, piercing's, hearing aid's, dentures/partials prior to surgery. Please bring cases for these items if needed.    Patients discharged the day of surgery will not be allowed to drive home, and someone needs to stay with them for 24 hours.  SURGICAL WAITING ROOM VISITATION Patients may have no more than 2 support people in the waiting area - these visitors may rotate.   Pre-op nurse will coordinate an appropriate time for 1 ADULT support person, who may not rotate, to accompany patient in pre-op.  Children under the age of 68 must have an adult with them who is not the patient and must remain in the main waiting area with an adult.  If the patient needs to stay at the hospital during part of their recovery, the  visitor guidelines for inpatient rooms apply.  Please refer to the [facility] website for the visitor guidelines for any additional information.   If you received a COVID test during your pre-op visit  it is requested that you wear a mask when out in public, stay away from anyone that may not be feeling well and notify your surgeon if you develop symptoms. If you have been in contact with anyone that has tested positive in the last 10 days please notify you surgeon.      Pre-operative CHG Bathing Instructions   You can play a key role in reducing the risk of infection after surgery. Your skin needs to be as free of germs as possible. You can reduce the number of germs on your skin by washing with CHG (chlorhexidine  gluconate) soap before surgery. CHG is an antiseptic soap that kills germs and continues to kill germs even after washing.   DO NOT use if you have an allergy to chlorhexidine /CHG or antibacterial soaps. If your skin becomes reddened or irritated, stop using the CHG and notify one of our RNs at [phone].              TAKE A SHOWER THE NIGHT BEFORE SURGERY   Please keep in mind the following:  DO NOT shave, including legs and underarms, 48 hours prior to surgery.   You may shave your face before/day of surgery.  Place clean sheets  on your bed the night before surgery Use a clean washcloth (not used since being washed) for shower. DO NOT sleep with pet's night before surgery.  CHG Shower Instructions:  Wash your face and private area with normal soap. If you choose to wash your hair, wash first with your normal shampoo.  After you use shampoo/soap, rinse your hair and body thoroughly to remove shampoo/soap residue.  Turn the water OFF and apply half the bottle of CHG soap to a CLEAN washcloth.  Apply CHG soap ONLY FROM YOUR NECK DOWN TO YOUR TOES (washing for 3-5 minutes)  DO NOT use CHG soap on face, private areas, open wounds, or sores.  Pay special attention to the  area where your surgery is being performed.  If you are having back surgery, having someone wash your back for you may be helpful. Wait 2 minutes after CHG soap is applied, then you may rinse off the CHG soap.  Pat dry with a clean towel  Put on clean pajamas    Additional instructions for the day of surgery: If you choose, you may shower the morning of surgery with an antibacterial soap.  DO NOT APPLY any lotions, deodorants, cologne, or perfumes.   Do not wear jewelry or makeup Do not wear nail polish, gel polish, artificial nails, or any other type of covering on natural nails (fingers and toes) Do not bring valuables to the hospital. [facility] is not responsible for valuables/personal belongings. Put on clean/comfortable clothes.  Please brush your teeth.  Ask your nurse before applying any prescription medications to the skin.

## 2024-06-27 ENCOUNTER — Other Ambulatory Visit: Payer: Self-pay | Admitting: *Deleted

## 2024-06-27 ENCOUNTER — Inpatient Hospital Stay (HOSPITAL_COMMUNITY)
Admission: RE | Admit: 2024-06-27 | Discharge: 2024-06-27 | Disposition: A | Source: Ambulatory Visit | Attending: Thoracic Surgery (Cardiothoracic Vascular Surgery)

## 2024-06-27 ENCOUNTER — Other Ambulatory Visit: Payer: Self-pay

## 2024-06-27 ENCOUNTER — Ambulatory Visit (HOSPITAL_COMMUNITY)
Admission: RE | Admit: 2024-06-27 | Discharge: 2024-06-27 | Disposition: A | Discharge status: Home / Self Care | Source: Ambulatory Visit | Attending: Thoracic Surgery (Cardiothoracic Vascular Surgery) | Admitting: Thoracic Surgery (Cardiothoracic Vascular Surgery)

## 2024-06-27 ENCOUNTER — Encounter (HOSPITAL_COMMUNITY): Payer: Self-pay | Admitting: Physician Assistant

## 2024-06-27 ENCOUNTER — Encounter (HOSPITAL_COMMUNITY): Payer: Self-pay

## 2024-06-27 VITALS — BP 112/91 | HR 50 | Temp 97.9°F | Resp 17 | Ht 66.0 in | Wt 118.0 lb

## 2024-06-27 DIAGNOSIS — R918 Other nonspecific abnormal finding of lung field: Secondary | ICD-10-CM

## 2024-06-27 DIAGNOSIS — Z79899 Other long term (current) drug therapy: Secondary | ICD-10-CM | POA: Insufficient documentation

## 2024-06-27 DIAGNOSIS — Z01818 Encounter for other preprocedural examination: Secondary | ICD-10-CM | POA: Insufficient documentation

## 2024-06-27 DIAGNOSIS — Z85118 Personal history of other malignant neoplasm of bronchus and lung: Secondary | ICD-10-CM | POA: Insufficient documentation

## 2024-06-27 DIAGNOSIS — I10 Essential (primary) hypertension: Secondary | ICD-10-CM | POA: Diagnosis not present

## 2024-06-27 DIAGNOSIS — Z7901 Long term (current) use of anticoagulants: Secondary | ICD-10-CM | POA: Insufficient documentation

## 2024-06-27 DIAGNOSIS — Z853 Personal history of malignant neoplasm of breast: Secondary | ICD-10-CM | POA: Insufficient documentation

## 2024-06-27 DIAGNOSIS — I77810 Thoracic aortic ectasia: Secondary | ICD-10-CM | POA: Diagnosis not present

## 2024-06-27 HISTORY — DX: Cardiac arrhythmia, unspecified: I49.9

## 2024-06-27 LAB — COMPREHENSIVE METABOLIC PANEL WITH GFR
ALT: 20 U/L (ref 0–44)
AST: 29 U/L (ref 15–41)
Albumin: 4 g/dL (ref 3.5–5.0)
Alkaline Phosphatase: 64 U/L (ref 38–126)
Anion gap: 6 (ref 5–15)
BUN: 28 mg/dL — ABNORMAL HIGH (ref 8–23)
CO2: 28 mmol/L (ref 22–32)
Calcium: 9.4 mg/dL (ref 8.9–10.3)
Chloride: 99 mmol/L (ref 98–111)
Creatinine, Ser: 1.16 mg/dL — ABNORMAL HIGH (ref 0.44–1.00)
GFR, Estimated: 46 mL/min — ABNORMAL LOW (ref >60)
Glucose, Bld: 100 mg/dL — ABNORMAL HIGH (ref 70–99)
Potassium: 4.2 mmol/L (ref 3.5–5.1)
Sodium: 133 mmol/L — ABNORMAL LOW (ref 135–145)
Total Bilirubin: 1.4 mg/dL — ABNORMAL HIGH (ref 0.0–1.2)
Total Protein: 7.1 g/dL (ref 6.5–8.1)

## 2024-06-27 LAB — CBC
HCT: 39.3 % (ref 36.0–46.0)
Hemoglobin: 13 g/dL (ref 12.0–15.0)
MCH: 31.1 pg (ref 26.0–34.0)
MCHC: 33.1 g/dL (ref 30.0–36.0)
MCV: 94 fL (ref 80.0–100.0)
Platelets: 274 K/uL (ref 150–400)
RBC: 4.18 MIL/uL (ref 3.87–5.11)
RDW: 14.2 % (ref 11.5–15.5)
WBC: 6.7 K/uL (ref 4.0–10.5)
nRBC: 0 % (ref 0.0–0.2)

## 2024-06-27 LAB — PROTIME-INR
INR: 1 (ref 0.8–1.2)
Prothrombin Time: 13.4 s (ref 11.4–15.2)

## 2024-06-27 LAB — SURGICAL PCR SCREEN
MRSA, PCR: NEGATIVE
Staphylococcus aureus: POSITIVE — AB

## 2024-06-27 LAB — APTT: aPTT: 30 s (ref 24–36)

## 2024-06-27 NOTE — Telephone Encounter (Signed)
 Late entry -- spoke with patient yesterday afternoon, has not been utilizing the EKG portion of her watch  Per patient she was going to get her son to show her how to do again and start utilizing  Will also have him help her send information via mychart  Per patient really only knows when she sees on her watch

## 2024-06-27 NOTE — Progress Notes (Signed)
 PCP - Dayna Motto, DO Cardiologist - Raford Riggs, MD  PPM/ICD - denies Device Orders - n/a Rep Notified - n/a  Chest x-ray - 06/27/2024 EKG - 06/27/2024 Stress Test - long time ago per pt ECHO - 03/17/2021 Cardiac Cath - denies  Sleep Study - no CPAP - n/a   Fasting Blood Sugar - no DM Checks Blood Sugar _____ times a day  Last dose of GLP1 agonist-  n/a GLP1 instructions: n/a  Blood Thinner Instructions: n/a Aspirin Instructions: n/a  ERAS Protcol -no PRE-SURGERY Ensure or G2- n/a  COVID TEST- n/a   Anesthesia review: yes, new onset of a-fib; no symptoms per pt. Pt wears a smart watch. Per pt her HR was going down to 30ies yesterday and she called her cardiologist. [place], PA is assessing the pt. HR 43-98. Pt's procedure is being postponed per dr. Chrystal office; pt needs to follow up wit A-fib clinic that is scheduled for tomorrow. Pt and her son are educated if there is any symptoms like chest pain, SOB, lightheadedness to go to ED/Urgent care.   Patient denies shortness of breath, fever, cough and chest pain at PAT appointment   All instructions explained to the patient, with a verbal understanding of the material. Patient agrees to go over the instructions while at home for a better understanding. Patient also instructed to self quarantine after being tested for COVID-19. The opportunity to ask questions was provided.

## 2024-06-28 ENCOUNTER — Ambulatory Visit (HOSPITAL_COMMUNITY)
Admission: RE | Admit: 2024-06-28 | Discharge: 2024-06-28 | Disposition: A | Source: Ambulatory Visit | Attending: Internal Medicine | Admitting: Internal Medicine

## 2024-06-28 ENCOUNTER — Encounter (HOSPITAL_COMMUNITY): Payer: Self-pay | Admitting: Internal Medicine

## 2024-06-28 ENCOUNTER — Ambulatory Visit (HOSPITAL_COMMUNITY)
Admission: RE | Admit: 2024-06-28 | Discharge: 2024-06-28 | Disposition: A | Discharge status: Home / Self Care | Source: Ambulatory Visit | Attending: Internal Medicine | Admitting: Internal Medicine

## 2024-06-28 VITALS — BP 114/70 | HR 69 | Ht 66.0 in | Wt 120.4 lb

## 2024-06-28 DIAGNOSIS — I10 Essential (primary) hypertension: Secondary | ICD-10-CM

## 2024-06-28 DIAGNOSIS — I480 Paroxysmal atrial fibrillation: Secondary | ICD-10-CM | POA: Diagnosis not present

## 2024-06-28 DIAGNOSIS — D6859 Other primary thrombophilia: Secondary | ICD-10-CM | POA: Diagnosis not present

## 2024-06-28 DIAGNOSIS — I471 Supraventricular tachycardia, unspecified: Secondary | ICD-10-CM

## 2024-06-28 MED ORDER — APIXABAN 2.5 MG PO TABS: 2.5000 mg | ORAL_TABLET | Freq: Two times a day (BID) | ORAL | 0 refills | Status: DC

## 2024-06-28 MED ORDER — APIXABAN 2.5 MG PO TABS: 2.5000 mg | ORAL_TABLET | Freq: Two times a day (BID) | ORAL | 0 refills | Status: DC | PRN

## 2024-06-28 MED ORDER — METOPROLOL TARTRATE 25 MG PO TABS: 12.5000 mg | ORAL_TABLET | Freq: Two times a day (BID) | ORAL | 0 refills | Status: AC

## 2024-06-28 NOTE — Patient Instructions (Signed)
 Wear monitor for 7 days remove 07/05/24 and send in mail   Start Eliquis 2.5 mg twice a day   May take metoprolol  12.5 mg ( 1/2 tablet) twice a day as needed for palpitations

## 2024-06-28 NOTE — Progress Notes (Signed)
 Primary Care Physician: Dayna Motto, DO Primary Cardiologist: Annabella Scarce, MD Electrophysiologist: Fonda Kitty, MD  Referring Physician: Annabella Scarce, MD   Haley [name] is a 89 y.o. female with a history of atrial tach, HTN, HLD, lung CA s/p wedge resection, breast CA aortic atherosclerosis, thoracic aortic dilation, who presents for consultation in the [facility] Atrial Fibrillation [facility].  The patient was initially diagnosed with yesterday by EKG during preadmission testing with atrial fibrillation with RVR. She had contacted our office with a complaint of elevated HR and smart watch indicating AF. She was scheduled to undergo a bronchoscopy procedure on 06/30/2024 that was canceled.She was previously seen by Dr. Kitty on 02/29/2024 for evaluation of abnormal heart monitor noted to have occasional episodes of dizziness with reduction of verapamil  from 180 mg to 120 mg. She also noted rates ranging from 31-141 bpm on her home monitor. ZIO monitor with no observed atrial fibrillation or flutter at that time. Patient presents today for follow up for atrial fibrillation.   Haley [name] presents today with her son for a follow-up in the AF clinic.  On examination today she is in sinus rhythm with controlled heart rate and notes no significant symptoms yesterday when she was in A-fib.  During today's visit we reviewed the pathophysiology of atrial fibrillation and discussed possible treatments if atrial fibrillation is persistent.  We discussed initiating anticoagulant based on her CHA2DS2-VASc score of 5 and she was in favor of beginning therapy.  We also discussed adding as needed dose of metoprolol  to her current medication regimen for elevated heart rate.  We will also repeat a ZIO monitor for 1 week to evaluate her AF burden.  She had all questions answered to her satisfaction and is in favor of the above mentioned plan.  Today, she denies symptoms of palpitations, chest pain, shortness  of breath, orthopnea, PND, lower extremity edema, dizziness, presyncope, syncope, snoring, daytime somnolence, bleeding, or neurologic sequela. The patient is tolerating medications without difficulties and is otherwise without complaint today.    Atrial Fibrillation Risk Factors:  she does not have symptoms or diagnosis of sleep apnea. she does not have a history of rheumatic fever. she does not have a history of alcohol use. The patient does have a history of early familial atrial fibrillation or other arrhythmias.  Atrial Fibrillation Management history:  Previous antiarrhythmic drugs: None Previous cardioversions: None Previous ablations: None Anticoagulation history: None  ROS- All systems are reviewed and negative except as per the HPI above.  Past Medical History:  Diagnosis Date   Arthritis    knees and left hip   Atrophic gastritis without mention of hemorrhage    Breast cancer (HCC)    Bronchitis, not specified as acute or chronic    Cancer (HCC)    Lung cancer   Cardiac dysrhythmia, unspecified    Cataract    Chronic pancreatitis (HCC)    Complication of anesthesia    Cough    Dysrhythmia    Esophageal reflux    Family history of malignant neoplasm of gastrointestinal tract    Fatty liver    Flatulence, eructation, and gas pain    Gastric erosions    [place]   Hiatal hernia    Hoarseness    since virus 2 months agp   Irritable bowel syndrome    Irritable bowel syndrome    LGSIL (low grade squamous intraepithelial dysplasia) 11/15/2014   with biopsy of  benign vaginal cyst   Malignant neoplasm  of breast (female), unspecified site    Skin Cancer legs, face, shoulder, some basal some squamous   Other chest pain    Personal history of radiation therapy    Pneumonia, organism unspecified(486)    PONV (postoperative nausea and vomiting)    with hemorrhoid surgery years ago, no n/v with breast surgery   Pure hypercholesterolemia    Shortness of breath     with Exertion   Sorethroat    since virus 2 months ago   Symptomatic menopausal or female climacteric states    Thoracic aortic aneurysm without rupture 08/29/2012   08/29/12 discovered on PET/CT   Tubular adenoma of colon    Unspecified essential hypertension    Urinary tract infection, site not specified     Current Outpatient Medications  Medication Sig Dispense Refill   atorvastatin  (LIPITOR) 40 MG tablet TAKE 1 TABLET EVERY DAY 90 tablet 3   b complex vitamins tablet Take 1 tablet by mouth daily.     cholecalciferol (VITAMIN D3) 25 MCG (1000 UNIT) tablet Take 1,000 Units by mouth 2 (two) times daily.     Coenzyme Q10 (COQ10 PO) Take 10 mLs by mouth daily.     losartan  (COZAAR ) 50 MG tablet TAKE 1 TABLET EVERY MORNING AND 1/2 TABLET EVERY EVENING 135 tablet 3   metoprolol  tartrate (LOPRESSOR ) 25 MG tablet Take 0.5 tablets (12.5 mg total) by mouth 2 (two) times daily. As needed for palpitations 30 tablet 0   OVER THE COUNTER MEDICATION Take 2 capsules by mouth daily. Emma digestive supplement     OVER THE COUNTER MEDICATION Take 1 capsule by mouth at bedtime. Omega XL     Probiotic Product (PROBIOTIC DAILY PO) Take 1 capsule by mouth every evening.     verapamil  (CALAN -SR) 120 MG CR tablet Take 1 tablet (120 mg total) by mouth at bedtime. 90 tablet 3   apixaban (ELIQUIS) 2.5 MG TABS tablet Take 1 tablet (2.5 mg total) by mouth 2 (two) times daily. 30 tablet 0   No current facility-administered medications for this encounter.    Physical Exam: BP 114/70   Pulse 69   Ht 5' 6 (1.676 m)   Wt 54.6 kg   BMI 19.43 kg/m   GEN: Well nourished, well developed in no acute distress NECK: No JVD; No carotid bruits CARDIAC: Regular rate and rhythm, no murmurs, rubs, gallops RESPIRATORY:  Clear to auscultation without rales, wheezing or rhonchi  ABDOMEN: Soft, non-tender, non-distended EXTREMITIES:  No edema; No deformity   Wt Readings from Last 3 Encounters:  06/28/24 54.6 kg   06/27/24 53.5 kg  06/06/24 54.9 kg     EKG today demonstrates:   EKG Interpretation Date/Time:  Wednesday June 28 2024 14:36:44 EST Ventricular Rate:  69 PR Interval:  224 QRS Duration:  90 QT Interval:  394 QTC Calculation: 422 R Axis:   25  Text Interpretation: Sinus rhythm with 1st degree A-V block with Blocked Premature atrial complexes Septal infarct (cited on or before 29-Feb-2024) Abnormal ECG When compared with ECG of 27-Jun-2024 12:53, Sinus rhythm has replaced Atrial fibrillation Vent. rate has decreased BY  60 BPM Nonspecific T wave abnormality, improved in Inferior leads Confirmed by Wyn Manus [phone]) on 06/28/2024 3:21:35 PM        Echo Scheduled for 07/05/2024  CHA2DS2-VASc Score = 5  The patient's score is based upon: CHF History: 0 HTN History: 1 Diabetes History: 0 Stroke History: 0 Vascular Disease History: 1 Age Score: 2 Gender Score: 1  ASSESSMENT AND PLAN: Paroxysmal Atrial Fibrillation (ICD10:  I48.0) The patient's CHA2DS2-VASc score is 5, indicating a 7.2% annual risk of stroke.   -Patient was in AF with RVR yesterday and today presents with sinus rhythm. - We will initiate Eliquis 2.5 mg with reduced dose due to her age and current weight -continue Verapamil  120 mg and will add Toprol  XL 12.5 mg prn for heart rate greater than 110 -1 week Zio to quantify AF burden.  Essential HTN: BP well controlled. Continue current antihypertensive regimen.   Hypercoagulable state: - Initiate Eliquis 2.5 mg twice daily  Signed,  Wyn Raddle, Jackee Shove, NP    06/28/2024 3:38 PM    Follow up in AF clinic in one month   Jackee Wyn, NP-C [facility] [address] [place], [place] [address] [phone]

## 2024-06-28 NOTE — Telephone Encounter (Signed)
 Notified by Lynwood Hope, PA that patient was in a fib RVR at preop appointment. Bronchoscopy scheduled for 06/30/24 has been postponed and patient is scheduled to see Fairy Heinrich, PA on 06/28/24.

## 2024-06-29 ENCOUNTER — Encounter: Payer: Self-pay | Admitting: *Deleted

## 2024-06-29 ENCOUNTER — Other Ambulatory Visit: Payer: Self-pay | Admitting: *Deleted

## 2024-06-29 DIAGNOSIS — R918 Other nonspecific abnormal finding of lung field: Secondary | ICD-10-CM

## 2024-06-30 ENCOUNTER — Encounter (HOSPITAL_COMMUNITY): Payer: Self-pay | Admitting: Physician Assistant

## 2024-06-30 ENCOUNTER — Ambulatory Visit (HOSPITAL_COMMUNITY)
Admission: RE | Admit: 2024-06-30 | Source: Home / Self Care | Admitting: Thoracic Surgery (Cardiothoracic Vascular Surgery)

## 2024-06-30 ENCOUNTER — Encounter (HOSPITAL_COMMUNITY): Admission: RE | Payer: Self-pay | Source: Home / Self Care

## 2024-06-30 SURGERY — VIDEO BRONCHOSCOPY WITH ENDOBRONCHIAL NAVIGATION
Anesthesia: General

## 2024-07-04 NOTE — Progress Notes (Signed)
 Surgical Instructions   Your procedure is scheduled on Friday, December 12th, 2025. Report to [facility] Main Entrance A at 11:00 A.M., then check in with the Admitting office. Any questions or running late day of surgery: call [phone]  Questions prior to your surgery date: call [phone], Monday-Friday, 8am-4pm. If you experience any cold or flu symptoms such as cough, fever, chills, shortness of breath, etc. between now and your scheduled surgery, please notify us  at the above number.     Remember:  Do not eat after midnight the night before your surgery   You may drink clear liquids until 10:00 the morning of your surgery.   Clear liquids allowed are: Water, Non-Citrus Juices (without pulp), Carbonated Beverages, Clear Tea (no milk, honey, etc.), Black Coffee Only (NO MILK, CREAM OR POWDERED CREAMER of any kind), and Gatorade.    Take these medicines the morning of surgery with A SIP OF WATER: Atorvastatin  (Lipitor) Metoprolol  Tartrate (Lopressor )   May take these medicines IF NEEDED: None.     One week prior to surgery, STOP taking any Aspirin (unless otherwise instructed by your surgeon) Aleve, Naproxen, Ibuprofen, Motrin, Advil, Goody's, BC's, all herbal medications, fish oil, and non-prescription vitamins.   Per your surgeon, Eliquis  should be stopped 2 days prior to your surgery.  Your last dose of Eliquis  should be on Tuesday, December 9th.                      Do NOT Smoke (Tobacco/Vaping) for 24 hours prior to your procedure.  If you use a CPAP at night, you may bring your mask/headgear for your overnight stay.   You will be asked to remove any contacts, glasses, piercing's, hearing aid's, dentures/partials prior to surgery. Please bring cases for these items if needed.    Patients discharged the day of surgery will not be allowed to drive home, and someone needs to stay with them for 24 hours.  SURGICAL WAITING ROOM VISITATION Patients may have no more  than 2 support people in the waiting area - these visitors may rotate.   Pre-op nurse will coordinate an appropriate time for 1 ADULT support person, who may not rotate, to accompany patient in pre-op.  Children under the age of 65 must have an adult with them who is not the patient and must remain in the main waiting area with an adult.  If the patient needs to stay at the hospital during part of their recovery, the visitor guidelines for inpatient rooms apply.  Please refer to the [facility] website for the visitor guidelines for any additional information.   If you received a COVID test during your pre-op visit  it is requested that you wear a mask when out in public, stay away from anyone that may not be feeling well and notify your surgeon if you develop symptoms. If you have been in contact with anyone that has tested positive in the last 10 days please notify you surgeon.      Pre-operative CHG Bathing Instructions   You can play a key role in reducing the risk of infection after surgery. Your skin needs to be as free of germs as possible. You can reduce the number of germs on your skin by washing with CHG (chlorhexidine  gluconate) soap before surgery. CHG is an antiseptic soap that kills germs and continues to kill germs even after washing.   DO NOT use if you have an allergy to chlorhexidine /CHG or antibacterial soaps. If  your skin becomes reddened or irritated, stop using the CHG and notify one of our RNs at [phone].              TAKE A SHOWER THE NIGHT BEFORE SURGERY   Please keep in mind the following:  DO NOT shave, including legs and underarms, 48 hours prior to surgery.   You may shave your face before/day of surgery.  Place clean sheets on your bed the night before surgery Use a clean washcloth (not used since being washed) for shower. DO NOT sleep with pet's night before surgery.  CHG Shower Instructions:  Wash your face and private area with normal soap. If you  choose to wash your hair, wash first with your normal shampoo.  After you use shampoo/soap, rinse your hair and body thoroughly to remove shampoo/soap residue.  Turn the water OFF and apply half the bottle of CHG soap to a CLEAN washcloth.  Apply CHG soap ONLY FROM YOUR NECK DOWN TO YOUR TOES (washing for 3-5 minutes)  DO NOT use CHG soap on face, private areas, open wounds, or sores.  Pay special attention to the area where your surgery is being performed.  If you are having back surgery, having someone wash your back for you may be helpful. Wait 2 minutes after CHG soap is applied, then you may rinse off the CHG soap.  Pat dry with a clean towel  Put on clean pajamas    Additional instructions for the day of surgery: If you choose, you may shower the morning of surgery with an antibacterial soap.  DO NOT APPLY any lotions, deodorants, cologne, or perfumes.   Do not wear jewelry or makeup Do not wear nail polish, gel polish, artificial nails, or any other type of covering on natural nails (fingers and toes) Do not bring valuables to the hospital. [facility] is not responsible for valuables/personal belongings. Put on clean/comfortable clothes.  Please brush your teeth.  Ask your nurse before applying any prescription medications to the skin.

## 2024-07-04 NOTE — Progress Notes (Signed)
 Per Bernardino, patient does not need to come back for a second PAT appointment and CXR and labs can be canceled.

## 2024-07-05 ENCOUNTER — Ambulatory Visit (HOSPITAL_COMMUNITY): Admission: RE | Admit: 2024-07-05 | Discharge: 2024-07-05 | Attending: Nurse Practitioner

## 2024-07-05 ENCOUNTER — Ambulatory Visit (HOSPITAL_COMMUNITY)
Admission: RE | Admit: 2024-07-05 | Discharge: 2024-07-05 | Disposition: A | Discharge status: Home / Self Care | Source: Ambulatory Visit | Attending: Nurse Practitioner | Admitting: Nurse Practitioner

## 2024-07-05 ENCOUNTER — Encounter (INDEPENDENT_AMBULATORY_CARE_PROVIDER_SITE_OTHER): Payer: Self-pay

## 2024-07-05 DIAGNOSIS — I351 Nonrheumatic aortic (valve) insufficiency: Secondary | ICD-10-CM | POA: Diagnosis present

## 2024-07-06 ENCOUNTER — Other Ambulatory Visit: Payer: Self-pay

## 2024-07-06 ENCOUNTER — Encounter (HOSPITAL_COMMUNITY): Payer: Self-pay | Admitting: Thoracic Surgery (Cardiothoracic Vascular Surgery)

## 2024-07-06 LAB — ECHOCARDIOGRAM COMPLETE
Area-P 1/2: 4.68 cm2
P 1/2 time: 436 ms
S' Lateral: 2.9 cm

## 2024-07-06 NOTE — Progress Notes (Signed)
 Anesthesia Chart Review: Same day workup  89 year old female with pertinent history including PONV, PSVT, hiatal hernia, ascending aortic aneurysm (stable 4.6 cm on PET imaging 11/25), lung cancer s/p left upper lobe wedge resection 2014, right breast cancer s/p lumpectomy 2004.  Patient seen in preadmission testing clinic on 06/27/2024 and noted to be in new onset A-fib with RVR.  She was asymptomatic and her vitals were stable. Cardiology was consulted and she was worked in to be seen in the A-fib clinic.  She was seen by Fairy Heinrich, PA-C on 06/28/2024.  She was noted to be in sinus rhythm at that time.  With CHA2DS2-VASc score of 5 she was started on Eliquis  and Toprol -XL 12.5 mg as needed for heart rate greater than 110.  She was continued on verapamil  120 mg daily.  Event monitor and echocardiogram were also ordered.  Echo 07/05/2024 showed LVEF 55 to 60%, normal wall motion, mild asymmetric LVH of the basal septal segment, normal RV, mild mitral regurgitation, moderate aortic regurgitation, mild dilation of ascending aorta 40 mm.  Results of the event monitor are still pending.  Patient was instructed by surgeon's office to stop Eliquis  2 days prior to surgery.  CMP and CBC 06/27/2024 reviewed, mild hyponatremia sodium 133, creatinine mildly elevated 1.16, otherwise unremarkable.  She will need day of surgery evaluation.  EKG 06/28/2024: Sinus rhythm with first-degree AV block with blocked PACs.  Rate 69.  TTE 07/05/2024:  1. Left ventricular ejection fraction, by estimation, is 55 to 60%. The  left ventricle has normal function. The left ventricle has no regional  wall motion abnormalities. There is mild asymmetric left ventricular  hypertrophy of the basal-septal segment.  Left ventricular diastolic parameters were normal. The average left  ventricular global longitudinal strain is -18.7 %. The global longitudinal  strain is normal.   2. Right ventricular systolic function is normal.  The right ventricular  size is normal. There is normal pulmonary artery systolic pressure.   3. Left atrial size was mildly dilated.   4. Right atrial size was mildly dilated.   5. The mitral valve is normal in structure. Mild mitral valve  regurgitation. No evidence of mitral stenosis.   6. The aortic valve is tricuspid. Aortic valve regurgitation is moderate.  No aortic stenosis is present. Aortic regurgitation PHT measures 436 msec.   7. Aortic dilatation noted. There is mild dilatation of the ascending  aorta, measuring 40 mm.   8. The inferior vena cava is normal in size with greater than 50%  respiratory variability, suggesting right atrial pressure of 3 mmHg.     Lynwood Geofm RIGGERS [facility] Short Stay Center/Anesthesiology Phone [phone] 07/06/2024 3:23 PM

## 2024-07-06 NOTE — Anesthesia Preprocedure Evaluation (Signed)
 Anesthesia Evaluation  Patient identified by MRN, date of birth, ID band Patient awake    Reviewed: Allergy & Precautions, NPO status , Patient's Chart, lab work & pertinent test results, reviewed documented beta blocker date and time   History of Anesthesia Complications (+) PONV and history of anesthetic complications  Airway Mallampati: II  TM Distance: >3 FB     Dental no notable dental hx.    Pulmonary shortness of breath and with exertion   breath sounds clear to auscultation       Cardiovascular hypertension, (-) CAD, (-) Past MI, (-) Cardiac Stents and (-) CABG + dysrhythmias Supra Ventricular Tachycardia  Rhythm:Regular Rate:Normal  IMPRESSIONS     1. Left ventricular ejection fraction, by estimation, is 55 to 60%. The  left ventricle has normal function. The left ventricle has no regional  wall motion abnormalities. There is mild asymmetric left ventricular  hypertrophy of the basal-septal segment.  Left ventricular diastolic parameters were normal. The average left  ventricular global longitudinal strain is -18.7 %. The global longitudinal  strain is normal.   2. Right ventricular systolic function is normal. The right ventricular  size is normal. There is normal pulmonary artery systolic pressure.   3. Left atrial size was mildly dilated.   4. Right atrial size was mildly dilated.   5. The mitral valve is normal in structure. Mild mitral valve  regurgitation. No evidence of mitral stenosis.   6. The aortic valve is tricuspid. Aortic valve regurgitation is moderate.  No aortic stenosis is present. Aortic regurgitation PHT measures 436 msec.   7. Aortic dilatation noted. There is mild dilatation of the ascending  aorta, measuring 40 mm.   8. The inferior vena cava is normal in size with greater than 50%  respiratory variability, suggesting right atrial pressure of 3 mmHg.      Neuro/Psych neg Seizures   Neuromuscular disease    GI/Hepatic hiatal hernia, PUD,GERD  Medicated and Controlled,,(+) neg Cirrhosis        Endo/Other    Renal/GU Renal disease     Musculoskeletal  (+) Arthritis ,    Abdominal   Peds  Hematology   Anesthesia Other Findings   Reproductive/Obstetrics                              Anesthesia Physical Anesthesia Plan  ASA: 3  Anesthesia Plan: General   Post-op Pain Management:    Induction: Intravenous  PONV Risk Score and Plan: 2 and Ondansetron , Dexamethasone and Propofol  infusion  Airway Management Planned: Oral ETT  Additional Equipment:   Intra-op Plan:   Post-operative Plan: Extubation in OR  Informed Consent: I have reviewed the patients History and Physical, chart, labs and discussed the procedure including the risks, benefits and alternatives for the proposed anesthesia with the patient or authorized representative who has indicated his/her understanding and acceptance.     Dental advisory given  Plan Discussed with: CRNA  Anesthesia Plan Comments: (PAT note by Lynwood Hope, PA-C: 89 year old female with pertinent history including PONV, PSVT, hiatal hernia, ascending aortic aneurysm (stable 4.6 cm on PET imaging 11/25), lung cancer s/p left upper lobe wedge resection 2014, right breast cancer s/p lumpectomy 2004.  Patient seen in preadmission testing clinic on 06/27/2024 and noted to be in new onset A-fib with RVR.  She was asymptomatic and her vitals were stable. Cardiology was consulted and she was worked in to be seen in the  A-fib clinic.  She was seen by Fairy Heinrich, PA-C on 06/28/2024.  She was noted to be in sinus rhythm at that time.  With CHA2DS2-VASc score of 5 she was started on Eliquis  and Toprol -XL 12.5 mg as needed for heart rate greater than 110.  She was continued on verapamil  120 mg daily.  Event monitor and echocardiogram were also ordered.  Echo 07/05/2024 showed LVEF 55 to 60%, normal wall  motion, mild asymmetric LVH of the basal septal segment, normal RV, mild mitral regurgitation, moderate aortic regurgitation, mild dilation of ascending aorta 40 mm.  Results of the event monitor are still pending.  Patient was instructed by surgeon's office to stop Eliquis  2 days prior to surgery.  CMP and CBC 06/27/2024 reviewed, mild hyponatremia sodium 133, creatinine mildly elevated 1.16, otherwise unremarkable.  She will need day of surgery evaluation.  EKG 06/28/2024: Sinus rhythm with first-degree AV block with blocked PACs.  Rate 69.  TTE 07/05/2024:  1. Left ventricular ejection fraction, by estimation, is 55 to 60%. The  left ventricle has normal function. The left ventricle has no regional  wall motion abnormalities. There is mild asymmetric left ventricular  hypertrophy of the basal-septal segment.  Left ventricular diastolic parameters were normal. The average left  ventricular global longitudinal strain is -18.7 %. The global longitudinal  strain is normal.   2. Right ventricular systolic function is normal. The right ventricular  size is normal. There is normal pulmonary artery systolic pressure.   3. Left atrial size was mildly dilated.   4. Right atrial size was mildly dilated.   5. The mitral valve is normal in structure. Mild mitral valve  regurgitation. No evidence of mitral stenosis.   6. The aortic valve is tricuspid. Aortic valve regurgitation is moderate.  No aortic stenosis is present. Aortic regurgitation PHT measures 436 msec.   7. Aortic dilatation noted. There is mild dilatation of the ascending  aorta, measuring 40 mm.   8. The inferior vena cava is normal in size with greater than 50%  respiratory variability, suggesting right atrial pressure of 3 mmHg.    )         Anesthesia Quick Evaluation

## 2024-07-06 NOTE — Progress Notes (Signed)
 Instructions reviewed with pt for surgery tomorrow. All questions answered for pt.

## 2024-07-07 ENCOUNTER — Encounter (HOSPITAL_COMMUNITY): Admission: RE | Disposition: A | Payer: Self-pay | Attending: Thoracic Surgery (Cardiothoracic Vascular Surgery)

## 2024-07-07 ENCOUNTER — Ambulatory Visit (HOSPITAL_COMMUNITY): Payer: Self-pay | Admitting: Physician Assistant

## 2024-07-07 ENCOUNTER — Ambulatory Visit (HOSPITAL_COMMUNITY)

## 2024-07-07 ENCOUNTER — Other Ambulatory Visit: Payer: Self-pay

## 2024-07-07 ENCOUNTER — Ambulatory Visit (HOSPITAL_BASED_OUTPATIENT_CLINIC_OR_DEPARTMENT_OTHER): Payer: Self-pay | Admitting: Nurse Practitioner

## 2024-07-07 ENCOUNTER — Ambulatory Visit (HOSPITAL_COMMUNITY)
Admission: RE | Admit: 2024-07-07 | Discharge: 2024-07-07 | Disposition: A | Attending: Thoracic Surgery (Cardiothoracic Vascular Surgery) | Admitting: Thoracic Surgery (Cardiothoracic Vascular Surgery)

## 2024-07-07 ENCOUNTER — Encounter (HOSPITAL_COMMUNITY): Payer: Self-pay | Admitting: Thoracic Surgery (Cardiothoracic Vascular Surgery)

## 2024-07-07 DIAGNOSIS — K861 Other chronic pancreatitis: Secondary | ICD-10-CM | POA: Diagnosis not present

## 2024-07-07 DIAGNOSIS — Z923 Personal history of irradiation: Secondary | ICD-10-CM | POA: Diagnosis not present

## 2024-07-07 DIAGNOSIS — E7800 Pure hypercholesterolemia, unspecified: Secondary | ICD-10-CM | POA: Diagnosis not present

## 2024-07-07 DIAGNOSIS — K449 Diaphragmatic hernia without obstruction or gangrene: Secondary | ICD-10-CM | POA: Diagnosis not present

## 2024-07-07 DIAGNOSIS — K219 Gastro-esophageal reflux disease without esophagitis: Secondary | ICD-10-CM | POA: Diagnosis not present

## 2024-07-07 DIAGNOSIS — C342 Malignant neoplasm of middle lobe, bronchus or lung: Secondary | ICD-10-CM | POA: Diagnosis not present

## 2024-07-07 DIAGNOSIS — M1990 Unspecified osteoarthritis, unspecified site: Secondary | ICD-10-CM | POA: Diagnosis not present

## 2024-07-07 DIAGNOSIS — C3411 Malignant neoplasm of upper lobe, right bronchus or lung: Secondary | ICD-10-CM | POA: Diagnosis not present

## 2024-07-07 DIAGNOSIS — I080 Rheumatic disorders of both mitral and aortic valves: Secondary | ICD-10-CM | POA: Diagnosis not present

## 2024-07-07 DIAGNOSIS — Z79899 Other long term (current) drug therapy: Secondary | ICD-10-CM | POA: Diagnosis not present

## 2024-07-07 DIAGNOSIS — K279 Peptic ulcer, site unspecified, unspecified as acute or chronic, without hemorrhage or perforation: Secondary | ICD-10-CM | POA: Diagnosis not present

## 2024-07-07 DIAGNOSIS — R911 Solitary pulmonary nodule: Secondary | ICD-10-CM | POA: Diagnosis present

## 2024-07-07 DIAGNOSIS — E785 Hyperlipidemia, unspecified: Secondary | ICD-10-CM | POA: Diagnosis not present

## 2024-07-07 DIAGNOSIS — I10 Essential (primary) hypertension: Secondary | ICD-10-CM | POA: Diagnosis not present

## 2024-07-07 DIAGNOSIS — I471 Supraventricular tachycardia, unspecified: Secondary | ICD-10-CM | POA: Diagnosis not present

## 2024-07-07 DIAGNOSIS — Z853 Personal history of malignant neoplasm of breast: Secondary | ICD-10-CM | POA: Diagnosis not present

## 2024-07-07 DIAGNOSIS — K589 Irritable bowel syndrome without diarrhea: Secondary | ICD-10-CM | POA: Diagnosis not present

## 2024-07-07 DIAGNOSIS — I7121 Aneurysm of the ascending aorta, without rupture: Secondary | ICD-10-CM | POA: Diagnosis not present

## 2024-07-07 DIAGNOSIS — R0602 Shortness of breath: Secondary | ICD-10-CM | POA: Diagnosis not present

## 2024-07-07 DIAGNOSIS — R918 Other nonspecific abnormal finding of lung field: Secondary | ICD-10-CM

## 2024-07-07 HISTORY — PX: BRONCHIAL BIOPSY: SHX5109

## 2024-07-07 HISTORY — PX: BRONCHIAL BRUSHINGS: SHX5108

## 2024-07-07 HISTORY — PX: BRONCHIAL NEEDLE ASPIRATION BIOPSY: SHX5106

## 2024-07-07 HISTORY — PX: VIDEO BRONCHOSCOPY WITH ENDOBRONCHIAL NAVIGATION: SHX6175

## 2024-07-07 LAB — BASIC METABOLIC PANEL WITH GFR
Anion gap: 11 (ref 5–15)
BUN: 15 mg/dL (ref 8–23)
CO2: 25 mmol/L (ref 22–32)
Calcium: 9.5 mg/dL (ref 8.9–10.3)
Chloride: 103 mmol/L (ref 98–111)
Creatinine, Ser: 0.94 mg/dL (ref 0.44–1.00)
GFR, Estimated: 59 mL/min — ABNORMAL LOW (ref >60)
Glucose, Bld: 90 mg/dL (ref 70–99)
Potassium: 4.2 mmol/L (ref 3.5–5.1)
Sodium: 139 mmol/L (ref 135–145)

## 2024-07-07 LAB — CBC
HCT: 37.1 % (ref 36.0–46.0)
Hemoglobin: 12.2 g/dL (ref 12.0–15.0)
MCH: 31.4 pg (ref 26.0–34.0)
MCHC: 32.9 g/dL (ref 30.0–36.0)
MCV: 95.6 fL (ref 80.0–100.0)
Platelets: 260 K/uL (ref 150–400)
RBC: 3.88 MIL/uL (ref 3.87–5.11)
RDW: 14.5 % (ref 11.5–15.5)
WBC: 4.1 K/uL (ref 4.0–10.5)
nRBC: 0 % (ref 0.0–0.2)

## 2024-07-07 SURGERY — VIDEO BRONCHOSCOPY WITH ENDOBRONCHIAL NAVIGATION
Anesthesia: General

## 2024-07-07 MED ORDER — ROCURONIUM BROMIDE 10 MG/ML (PF) SYRINGE
PREFILLED_SYRINGE | INTRAVENOUS | Status: DC | PRN
  Administered 2024-07-07: 40 mg via INTRAVENOUS
  Administered 2024-07-07: 20 mg via INTRAVENOUS

## 2024-07-07 MED ORDER — SUGAMMADEX SODIUM 200 MG/2ML IV SOLN
INTRAVENOUS | Status: DC | PRN
  Administered 2024-07-07: 200 mg via INTRAVENOUS

## 2024-07-07 MED ORDER — LIDOCAINE 2% (20 MG/ML) 5 ML SYRINGE
INTRAMUSCULAR | Status: DC | PRN
  Administered 2024-07-07: 60 mg via INTRAVENOUS

## 2024-07-07 MED ORDER — CHLORHEXIDINE GLUCONATE 0.12 % MT SOLN
15.0000 mL | Freq: Once | OROMUCOSAL | Status: AC
  Administered 2024-07-07: 15 mL via OROMUCOSAL
  Filled 2024-07-07: qty 15

## 2024-07-07 MED ORDER — LACTATED RINGERS IV SOLN: INTRAVENOUS | Status: DC

## 2024-07-07 MED ORDER — ONDANSETRON HCL 4 MG/2ML IJ SOLN
INTRAMUSCULAR | Status: DC | PRN
  Administered 2024-07-07: 4 mg via INTRAVENOUS

## 2024-07-07 MED ORDER — PROPOFOL 500 MG/50ML IV EMUL
INTRAVENOUS | Status: DC | PRN
  Administered 2024-07-07: 125 ug/kg/min via INTRAVENOUS

## 2024-07-07 MED ORDER — PROPOFOL 10 MG/ML IV BOLUS
INTRAVENOUS | Status: DC | PRN
  Administered 2024-07-07: 80 mg via INTRAVENOUS

## 2024-07-07 NOTE — Discharge Instructions (Addendum)
 Do not drive or engage in heavy physical activity for 24 hours  You may resume normal activities tomorrow  You may cough up small amounts of blood over the next few days  You may use acetaminophen  (Tylenol ) if needed for discomfort.  You may use an over-the-counter cough medication and/ or throat lozenges if needed  Call [phone] if you develop chest pain, shortness of breath, fever > 101 F or cough up more than a tablespoon of blood.  My office will contact you with a follow up appointment  Resume Eliquis  on 12/14

## 2024-07-07 NOTE — Anesthesia Procedure Notes (Signed)
 Procedure Name: Intubation Date/Time: 07/07/2024 1:03 PM  Performed by: Marva Lonni PARAS, CRNAPre-anesthesia Checklist: Patient identified, Emergency Drugs available, Suction available and Patient being monitored Patient Re-evaluated:Patient Re-evaluated prior to induction Oxygen Delivery Method: Circle System Utilized Preoxygenation: Pre-oxygenation with 100% oxygen Induction Type: IV induction Ventilation: Mask ventilation without difficulty Laryngoscope Size: Mac and 3 Grade View: Grade II Tube type: Oral Tube size: 8.5 mm Number of attempts: 1 Airway Equipment and Method: Stylet Placement Confirmation: ETT inserted through vocal cords under direct vision, positive ETCO2 and breath sounds checked- equal and bilateral Secured at: 21 cm Tube secured with: Tape Dental Injury: Teeth and Oropharynx as per pre-operative assessment

## 2024-07-07 NOTE — Brief Op Note (Signed)
 07/07/2024  2:24 PM  PATIENT:  Haley [name]  89 y.o. 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  PROCEDURE:  Procedures with comments: VIDEO BRONCHOSCOPY WITH ENDOBRONCHIAL NAVIGATION (N/A) - ROBOTIC BRONCHOSCOPY BRONCHOSCOPY, WITH BIOPSY BRONCHOSCOPY, WITH NEEDLE ASPIRATION BIOPSY BRONCHOSCOPY, WITH BRUSH BIOPSY  SURGEON:  Surgeons and Role:    * Kerrin Elspeth BROCKS, MD - Primary  PHYSICIAN ASSISTANT:   ASSISTANTS: none   ANESTHESIA:   general  EBL:  5 mL   BLOOD ADMINISTERED:none  DRAINS: none   LOCAL MEDICATIONS USED:  NONE  SPECIMEN:  Source of Specimen:  RUL nodule, RML nodule  DISPOSITION OF SPECIMEN:  PATHOLOGY  COUNTS:  NO endo  TOURNIQUET:  * No tourniquets in log *  DICTATION: .Other Dictation: Dictation Number -  PLAN OF CARE: Discharge to home after PACU  PATIENT DISPOSITION:  PACU - hemodynamically stable.   Delay start of Pharmacological VTE agent (>24hrs) due to surgical blood loss or risk of bleeding: not applicable

## 2024-07-07 NOTE — Op Note (Signed)
 Video Bronchoscopy with Robotic Assisted Bronchoscopic Navigation   Date of Operation: 07/07/2024   Pre-op Diagnosis: Right upper and middle lobe nodules  Post-op Diagnosis: Probable non-small cell carcinoma right upper and middle lobe  Surgeon: Dr. Kerrin  Assistants: none  Anesthesia: General endotracheal anesthesia  Operation: Flexible video fiberoptic bronchoscopy with robotic assistance and biopsies.  Estimated Blood Loss: Minimal  Complications: None  Indications and History: Haley [name] is a 89 y.o. female with right upper and middle lobe lung nodules .  Recommendation made to achieve a tissue diagnosis via robotic assisted navigational bronchoscopy.  The risks, benefits, complications, treatment options and expected outcomes were discussed with the patient.  The possibilities of pneumothorax, pneumonia, reaction to medication, pulmonary aspiration, perforation of a viscus, bleeding, failure to diagnose a condition and creating a complication requiring transfusion or operation were discussed with the patient who freely signed the consent.    Description of Procedure: The patient was seen in the Preoperative Area, was examined and was deemed appropriate to proceed.  The patient was taken to [facility] Endoscopy room 3, identified as [name] and the procedure verified as Flexible Video Fiberoptic Bronchoscopy.  A Time Out was held and the above information confirmed.   Prior to the date of the procedure a high-resolution CT scan of the chest was performed. Utilizing ION software program a virtual tracheobronchial tree was generated to allow the creation of distinct navigation pathways to the patient's parenchymal abnormalities. After being taken to the operating room general anesthesia was initiated and the patient  was orally intubated. The video fiberoptic bronchoscope was introduced via the endotracheal tube and a general inspection was performed which showed normal right  and left lung anatomy. Aspiration of the bilateral mainstems was completed to remove any remaining secretions. Robotic catheter inserted into patient's endotracheal tube.   Target #1 Right upper lobe: The distinct navigation pathways prepared prior to this procedure were then utilized to navigate to patient's lesion identified on CT scan. The robotic catheter was secured into place and the vision probe was withdrawn.  Lesion location was approximated using fluoroscopy.  Local registration and targeting was performed using [employer] Cios mobile C-arm three-dimensional imaging. Under fluoroscopic guidance transbronchial needle brushings, transbronchial needle biopsies, and transbronchial forceps biopsies were performed to be sent for cytology and pathology.  Position relative to the nodule was confirmed using Cios mobile C-arm.   Target #2 Right middle lobe: The distinct navigation pathways prepared prior to this procedure were then utilized to navigate to patient's lesion identified on CT scan. The robotic catheter was secured into place and the vision probe was withdrawn.  Lesion location was approximated using fluoroscopy.  Local registration and targeting was performed using [employer] Cios mobile C-arm three-dimensional imaging. Under fluoroscopic guidance transbronchial needle brushings, transbronchial needle biopsies, and transbronchial forceps biopsies were performed to be sent for cytology and pathology.    At the end of the procedure a general airway inspection was performed and there was no evidence of active bleeding. The bronchoscope was removed.  The patient tolerated the procedure well. There was no significant blood loss and there were no obvious complications. A post-procedural chest x-ray is pending.  Samples Target #1: 1. Transbronchial needle brushings from RUL 2. Transbronchial Wang needle biopsies from RUL 3. Transbronchial forceps biopsies from RUL   Samples  Target #2: 1. Transbronchial needle brushings from RML 2. Transbronchial Wang needle biopsies from RML 3. Transbronchial forceps biopsies from RML   Plans:  The patient will be discharged from the PACU to home when recovered from anesthesia and after chest x-ray is reviewed. We will review the cytology, pathology and microbiology results with the patient when they become available. Outpatient followup will be with Dr. Kerrin.

## 2024-07-07 NOTE — H&P (Signed)
 [address], [place] [address]             [phone]         HPI: Haley [name] returns to discuss the results of her PET/CT   Haley [name] is an 89 year old woman with a history of non-small cell carcinoma of the lung, ascending aneurysm, hypertension, hyperlipidemia, supraventricular tachycardia, breast cancer, skin cancer, chronic pancreatitis, reflux, irritable bowel syndrome, and lung nodules.   I did a left upper lobe wedge resection for a ground glass opacity in 2014.  It turned out to be a stage Ia adenocarcinoma.  She has been followed since that time for her lung cancer and her ascending aneurysm.   In 2020 she was noted to have a complex ground glass opacity in her right upper lobe.  We had discussed biopsy on multiple occasions but she always opted for radiographic follow-up.   I saw her a couple of weeks ago and her right upper lobe opacity was stable in overall size but there was much more of a solid component on the vessel suppression images and there had been previously.  I recommended a PET/CT.  Of note her aneurysm was again stable.   She now returns to discuss results of her PET/CT.       Past Medical History:  Diagnosis Date   Arthritis      knees and left hip   Atrophic gastritis without mention of hemorrhage     Breast cancer (HCC)     Bronchitis, not specified as acute or chronic     Cancer (HCC)      Lung cancer   Cardiac dysrhythmia, unspecified     Cataract     Chronic pancreatitis (HCC)     Complication of anesthesia     Cough     Esophageal reflux     Family history of malignant neoplasm of gastrointestinal tract     Fatty liver     Flatulence, eructation, and gas pain     Gastric erosions      Cameron   Hiatal hernia     Hoarseness      since virus 2 months agp   Irritable bowel syndrome     Irritable bowel syndrome     LGSIL (low grade squamous intraepithelial dysplasia) 11/15/2014    with biopsy of  benign  vaginal cyst   Malignant neoplasm of breast (female), unspecified site      Skin Cancer legs, face, shoulder, some basal some squamous   Other chest pain     Personal history of radiation therapy     Pneumonia, organism unspecified(486)     PONV (postoperative nausea and vomiting)      with hemorrhoid surgery years ago, no n/v with breast surgery   Pure hypercholesterolemia     Shortness of breath      with Exertion   Sorethroat      since virus 2 months ago   Symptomatic menopausal or female climacteric states     Thoracic aortic aneurysm without rupture 08/29/12    08/29/12 discovered on PET/CT   Tubular adenoma of colon     Unspecified essential hypertension     Urinary tract infection, site not specified               Past Surgical History:  Procedure Laterality Date   BREAST LUMPECTOMY Right 03/23/2003    w/ radiation. no chemo.   BREAST SURGERY  CATARACT EXTRACTION        Bilateral   COLONOSCOPY       EYE SURGERY       HEMORROIDECTOMY       SKIN CANCER EXCISION       VIDEO ASSISTED THORACOSCOPY (VATS)/WEDGE RESECTION Left 09/19/2012    Procedure: LEFT VIDEO ASSISTED THORACOSCOPY (VATS)/LEFT UPPER LOBE WEDGE RESECTION & NODE DISSECTION;  Surgeon: Elspeth JAYSON Millers, MD;  Location: [facility] OR;  Service: Thoracic;  Laterality: Left;                Current Outpatient Medications  Medication Sig Dispense Refill   atorvastatin  (LIPITOR) 40 MG tablet TAKE 1 TABLET EVERY DAY 90 tablet 1   b complex vitamins tablet Take 1 tablet by mouth daily.       Biotin 89999 MCG TABS Take 1 tablet by mouth daily.       co-enzyme Q-10 30 MG capsule Take 300 mg by mouth daily.       losartan  (COZAAR ) 50 MG tablet TAKE 1 TABLET EVERY MORNING AND 1/2 TABLET EVERY EVENING 135 tablet 1   Omega-3 1000 MG CAPS Take by mouth.       OVER THE COUNTER MEDICATION Emme for constipation       Probiotic Product (PROBIOTIC DAILY PO) Take by mouth.       verapamil  (CALAN -SR) 120 MG CR tablet Take 1  tablet (120 mg total) by mouth at bedtime. 90 tablet 3   Vitamin D , Cholecalciferol, 50 MCG (2000 UT) CAPS Take 1 capsule by mouth daily.       azelastine (ASTELIN) 0.1 % nasal spray Place 2 sprays into both nostrils 2 (two) times daily. (Patient not taking: Reported on 06/06/2024)       fluticasone (FLONASE) 50 MCG/ACT nasal spray Place 2 sprays into both nostrils daily. (Patient not taking: Reported on 06/06/2024)       levocetirizine (XYZAL) 5 MG tablet Take 5 mg by mouth daily. (Patient not taking: Reported on 06/06/2024)          No current facility-administered medications for this visit.        Physical Exam BP 130/76 (BP Location: Left Arm)   Pulse 87   Resp 18   Ht 5' 5 (1.651 m)   Wt 121 lb (54.9 kg)   SpO2 96%   BMI 20.75 kg/m  89 year old woman in no acute distress Alert and oriented x 3, no focal motor deficits No cervical or supraclavicular adenopathy Lungs clear bilaterally Cardiac regular rate and rhythm, no murmur No clubbing, cyanosis or edema   Diagnostic Tests: NUCLEAR MEDICINE PET SKULL BASE TO THIGH   TECHNIQUE: 6.07 mCi F-18 FDG was injected intravenously. Full-ring PET imaging was performed from the skull base to thigh after the radiotracer. CT data was obtained and used for attenuation correction and anatomic localization.   Fasting blood glucose: 91 mg/dl   COMPARISON:  Chest CT May 16, 2024, CT angio chest February 28, 2021, multiple priors dated including chest CT September 19, 2013, PET-CT August 29, 2012   FINDINGS: Mediastinal blood pool activity: SUV max 1.9   Liver activity: SUV max 2.7   NECK: No suspicious lymphadenopathy.   CHEST:   Hypermetabolic nodule in medial aspect of middle lobe measuring with max SUV 8.4, previously measured 2.1 x 1 cm.   Right lung apex subsolid nodule measuring 2.1 x 1.5 cm with max SUV 1.4, previously measured 2 x 1.1 cm. A tendril is extending from nodule to the  lateral pleural surface. This  nodule was not seen on prior PET-CT from 2014 and gradually enlarging when compared to 2023 (measured 1.3 cm).   Postsurgical changes from prior resection in left upper lobe. Multiple bronchocentric tree-in-bud micronodules predominantly in right lower lobe below PET resolution, similar to prior CT and new to exams dated back to 2015.   No significant pleural effusion.   Aneurysmal dilation of ascending aorta measuring up to 4.6 cm. Cardiomegaly. Atherosclerotic calcifications of aorta and coronary arteries.   Right breast lumpectomy without suspicious hypermetabolic findings to suggest recurrent malignancy. No FDG avid or enlarged axillary or internal mammary lymphadenopathy.   ABDOMEN/PELVIS: No hypermetabolic lymphadenopathy or suspicious findings to suggest distant metastasis. Atrophic changes of the pancreas.   SKELETON: Multilevel degenerative changes of the spine. No suspicious osseous lesion.   IMPRESSION: Hypermetabolic right middle lobe nodule concerning for malignancy until proven otherwise (primary versus metastatic) and stable to recent chest CT and gradually enlarging to 2023. Recommend tissue sampling.   Subsolid right apical nodule is stable to prior and mildly FDG avid. Adenocarcinoma spectrum (lepidic variant) can not be excluded and may represent as low FDG avid lesions. This finding is gradually enlarging and appearing more solid compared to prior exams. Wedge resection changes in left upper lobe without suspicious metabolic activity. Additional bronchocentric tree-in-bud micronodules predominantly in right lower lobe, below PET resolution may represent infection/inflammation versus metastasis. Follow-up according to oncologic protocol.   Stable aneurysmal dilation of ascending aorta.   No hypermetabolic finding in the abdomen and pelvis to suggest distant metastatic disease.     Electronically Signed   By: Megan  Zare M.D.   On: 05/31/2024  12:54 I personally reviewed the PET/CT images.  There is subsolid right apical nodule that has mild FDG activity.  Medial right middle lobe nodule with significant metabolic activity.  No mediastinal or hilar adenopathy.   Impression: Haley [name] is an 89 year old woman with a history of non-small cell carcinoma of the lung, ascending aneurysm, hypertension, hyperlipidemia, supraventricular tachycardia, breast cancer, skin cancer, chronic pancreatitis, reflux, irritable bowel syndrome, and lung nodules.   Right upper lobe nodule-primarily ground glass but now with more density on the vessel suppression images.  Low-grade activity on PET.  Suspect this is a low-grade adenocarcinoma.   Right middle lobe nodule-previously thought to represent inspissated mucus.  That may still be the case but it does have significant metabolic activity on PET.  Also suspicious for a primary bronchogenic carcinoma.   Given her advanced age and other medical issues she is not a great candidate for surgical resection.  She is a candidate for stereotactic radiation.   I recommended that we proceed with a robotic navigational bronchoscopy for diagnostic purposes.  She understands this would be done under general anesthesia on an outpatient basis.  I informed her of the indications, risks, benefits, and alternatives.  She understands the risks include, but not limited to death, MI, DVT, PE, bleeding, pneumothorax, as well as possibility of other unforeseeable complications.     Plan: Robotic bronchoscopy to sample right upper and middle lobe nodules   Elspeth JAYSON Millers, MD [facility] [phone]                Electronically signed by Millers Elspeth JAYSON, MD at 06/06/2024  5:40 PM  Patient seen and examined, In a regular rhythm today A fib  Resume Eliquis  12/14  Elspeth C. Millers, MD [facility] [phone]

## 2024-07-07 NOTE — Transfer of Care (Signed)
 Immediate Anesthesia Transfer of Care Note  Patient: Haley [name]  Procedure(s) Performed: VIDEO BRONCHOSCOPY WITH ENDOBRONCHIAL NAVIGATION BRONCHOSCOPY, WITH BIOPSY BRONCHOSCOPY, WITH NEEDLE ASPIRATION BIOPSY BRONCHOSCOPY, WITH BRUSH BIOPSY  Patient Location: Endoscopy Unit  Anesthesia Type:General  Level of Consciousness: awake, alert , oriented, and patient cooperative  Airway & Oxygen Therapy: Patient Spontanous Breathing  Post-op Assessment: Report given to RN and Post -op Vital signs reviewed and stable  Post vital signs: Reviewed and stable  Last Vitals:  Vitals Value Taken Time  BP    Temp    Pulse 52 07/07/24 14:19  Resp 19 07/07/24 14:19  SpO2 98 % 07/07/24 14:19  Vitals shown include unfiled device data.  Last Pain:  Vitals:   07/07/24 1144  TempSrc:   PainSc: 0-No pain         Complications: No notable events documented.

## 2024-07-09 ENCOUNTER — Encounter (HOSPITAL_COMMUNITY): Payer: Self-pay | Admitting: Thoracic Surgery (Cardiothoracic Vascular Surgery)

## 2024-07-10 NOTE — Anesthesia Postprocedure Evaluation (Signed)
 Anesthesia Post Note  Patient: Haley [name]  Procedure(s) Performed: VIDEO BRONCHOSCOPY WITH ENDOBRONCHIAL NAVIGATION BRONCHOSCOPY, WITH BIOPSY BRONCHOSCOPY, WITH NEEDLE ASPIRATION BIOPSY BRONCHOSCOPY, WITH BRUSH BIOPSY     Patient location during evaluation: PACU Anesthesia Type: General Level of consciousness: awake and alert Pain management: pain level controlled Vital Signs Assessment: post-procedure vital signs reviewed and stable Respiratory status: spontaneous breathing, nonlabored ventilation, respiratory function stable and patient connected to nasal cannula oxygen Cardiovascular status: blood pressure returned to baseline and stable Postop Assessment: no apparent nausea or vomiting Anesthetic complications: no   No notable events documented.  Last Vitals:  Vitals:   07/07/24 1505 07/07/24 1510  BP:    Pulse: (!) 48 (!) 48  Resp: 18 16  Temp:    SpO2: 98% 100%    Last Pain:  Vitals:   07/07/24 1450  TempSrc:   PainSc: 0-No pain                 Lynwood MARLA Cornea

## 2024-07-11 ENCOUNTER — Other Ambulatory Visit (HOSPITAL_COMMUNITY): Payer: Self-pay | Admitting: *Deleted

## 2024-07-11 MED ORDER — APIXABAN 2.5 MG PO TABS: 2.5000 mg | ORAL_TABLET | Freq: Two times a day (BID) | ORAL | 2 refills | Status: AC

## 2024-07-13 ENCOUNTER — Ambulatory Visit: Admitting: Thoracic Surgery (Cardiothoracic Vascular Surgery)

## 2024-07-13 LAB — CYTOLOGY - NON PAP

## 2024-07-14 ENCOUNTER — Encounter: Payer: Self-pay | Admitting: Thoracic Surgery (Cardiothoracic Vascular Surgery)

## 2024-07-14 ENCOUNTER — Ambulatory Visit
Attending: Thoracic Surgery (Cardiothoracic Vascular Surgery) | Admitting: Thoracic Surgery (Cardiothoracic Vascular Surgery)

## 2024-07-14 VITALS — BP 152/83 | HR 78 | Resp 20 | Ht 66.0 in | Wt 122.5 lb

## 2024-07-14 DIAGNOSIS — R918 Other nonspecific abnormal finding of lung field: Secondary | ICD-10-CM

## 2024-07-14 NOTE — Progress Notes (Signed)
 "  [address], [place] [address]             [phone]    HPI: Haley [name] returns to discuss the results of her bronchoscopy.  Haley [name] is an 89 year old woman who had a left upper lobe wedge resection for a stage Ia adenocarcinoma in 2014.  I have been following her since then for her lung cancer and an ascending aneurysm.  In 2020 she was noted to have a complex ground glass opacity in her right upper lobe.  We have been following that and I suggested multiple times that we biopsy but she wanted to wait.  On her most recent CT there was an increase in the solid component and she agreed to a biopsy.  There was a second nodule in the right middle lobe that was thought to be inspissated mucus.  However that area was also hypermetabolic on PET/CT.  I did a robotic bronchoscopy on 07/07/2024.  She tolerated the procedure well and went home the same day.  No issues related to the procedure.  Past Medical History:  Diagnosis Date   Arthritis    knees and left hip   Atrophic gastritis without mention of hemorrhage    Breast cancer (HCC)    Bronchitis, not specified as acute or chronic    Cancer (HCC)    Lung cancer   Cardiac dysrhythmia, unspecified    Cataract    Chronic pancreatitis (HCC)    Complication of anesthesia    Cough    Dysrhythmia    Esophageal reflux    Family history of malignant neoplasm of gastrointestinal tract    Fatty liver    Flatulence, eructation, and gas pain    Gastric erosions    Cameron   Hiatal hernia    Hoarseness    since virus 2 months agp   Irritable bowel syndrome    Irritable bowel syndrome    LGSIL (low grade squamous intraepithelial dysplasia) 11/15/2014   with biopsy of  benign vaginal cyst   Malignant neoplasm of breast (female), unspecified site    Skin Cancer legs, face, shoulder, some basal some squamous   Other chest pain    Personal history of radiation therapy    Pneumonia, organism  unspecified(486)    PONV (postoperative nausea and vomiting)    with hemorrhoid surgery years ago, no n/v with breast surgery   Pure hypercholesterolemia    Shortness of breath    with Exertion   Sorethroat    since virus 2 months ago   Symptomatic menopausal or female climacteric states    Thoracic aortic aneurysm without rupture 08/29/2012   08/29/12 discovered on PET/CT   Tubular adenoma of colon    Unspecified essential hypertension    Urinary tract infection, site not specified     Current Outpatient Medications  Medication Sig Dispense Refill   apixaban  (ELIQUIS ) 2.5 MG TABS tablet Take 1 tablet (2.5 mg total) by mouth 2 (two) times daily. 180 tablet 2   atorvastatin  (LIPITOR) 40 MG tablet TAKE 1 TABLET EVERY DAY 90 tablet 3   b complex vitamins tablet Take 1 tablet by mouth daily.     cholecalciferol (VITAMIN D3) 25 MCG (1000 UNIT) tablet Take 1,000 Units by mouth 2 (two) times daily.     Coenzyme Q10 (COQ10 PO) Take 10 mLs by mouth daily.     losartan  (COZAAR ) 50 MG tablet TAKE 1 TABLET EVERY MORNING AND 1/2  TABLET EVERY EVENING 135 tablet 3   metoprolol  tartrate (LOPRESSOR ) 25 MG tablet Take 0.5 tablets (12.5 mg total) by mouth 2 (two) times daily. As needed for palpitations 30 tablet 0   OVER THE COUNTER MEDICATION Take 2 capsules by mouth daily. Emma digestive supplement     OVER THE COUNTER MEDICATION Take 1 capsule by mouth at bedtime. Omega XL     Probiotic Product (PROBIOTIC DAILY PO) Take 1 capsule by mouth every evening.     verapamil  (CALAN -SR) 120 MG CR tablet Take 1 tablet (120 mg total) by mouth at bedtime. 90 tablet 3   No current facility-administered medications for this visit.    Physical Exam BP (!) 152/83 (BP Location: Right Arm, Patient Position: Sitting, Cuff Size: Normal)   Pulse 78   Resp 20   Ht 5' 6 (1.676 m)   Wt 122 lb 8 oz (55.6 kg)   SpO2 98% Comment: RA  BMI 19.81 kg/m  89 year old woman anxious but in no acute distress  Diagnostic  Tests: Biopsies of upper and middle lobe nodule showed adenocarcinoma.  Impression: Haley [name] is an 89 year old woman with a history of an ascending aneurysm and stage Ia adenocarcinoma of the left upper lobe status post wedge resection in 2014.  She now has synchronous stage I adenocarcinomas in the right upper and middle lobes.  We discussed the biopsy results.  Given her age and memory issues, I recommended that she consider radiation for the nodules.  Technically she is a surgical candidate but is not sure she would do well with surgery at this point in time.  Her son knows Dr. Shannon of radiation oncology.  Will make a referral to him.  If for any reason he does not think she can be treated with radiation then I would recommend surgery.  Plan: I will plan to see her back in 6 months with a CT to follow-up her 4.9 cm ascending aneurysm Will need to coordinate with Dr. Shannon to prevent duplicate scanning.  I spent over 10 minutes in review of records, results, and in consultation with Haley [name] today. Haley JAYSON Millers, MD [facility] [phone]     "

## 2024-07-17 ENCOUNTER — Ambulatory Visit (HOSPITAL_COMMUNITY): Payer: Self-pay | Admitting: Internal Medicine

## 2024-07-21 ENCOUNTER — Encounter (HOSPITAL_BASED_OUTPATIENT_CLINIC_OR_DEPARTMENT_OTHER): Payer: Self-pay | Admitting: Cardiovascular Disease

## 2024-07-21 ENCOUNTER — Ambulatory Visit (HOSPITAL_BASED_OUTPATIENT_CLINIC_OR_DEPARTMENT_OTHER): Admitting: Cardiovascular Disease

## 2024-07-21 VITALS — BP 130/72 | HR 71 | Ht 66.0 in | Wt 119.0 lb

## 2024-07-21 DIAGNOSIS — I471 Supraventricular tachycardia, unspecified: Secondary | ICD-10-CM | POA: Diagnosis not present

## 2024-07-21 DIAGNOSIS — I10 Essential (primary) hypertension: Secondary | ICD-10-CM | POA: Diagnosis not present

## 2024-07-21 DIAGNOSIS — I7121 Aneurysm of the ascending aorta, without rupture: Secondary | ICD-10-CM | POA: Diagnosis not present

## 2024-07-21 DIAGNOSIS — E7800 Pure hypercholesterolemia, unspecified: Secondary | ICD-10-CM | POA: Diagnosis not present

## 2024-07-21 NOTE — Patient Instructions (Signed)
 Medication Instructions:   Your physician recommends that you continue on your current medications as directed. Please refer to the Current Medication list given to you today.   *If you need a refill on your cardiac medications before your next appointment, please call your pharmacy*  Lab Work:  None ordered.  If you have labs (blood work) drawn today and your tests are completely normal, you will receive your results only by: MyChart Message (if you have MyChart) OR A paper copy in the mail If you have any lab test that is abnormal or we need to change your treatment, we will call you to review the results.  Testing/Procedures:  None ordered.  Follow-Up: At [facility], you and your health needs are our priority.  As part of our continuing mission to provide you with exceptional heart care, our providers are all part of one team.  This team includes your primary Cardiologist (physician) and Advanced Practice Providers or APPs (Physician Assistants and Nurse Practitioners) who all work together to provide you with the care you need, when you need it.  Your next appointment:   6 month(s)  Provider:   Annabella Scarce, MD    We recommend signing up for the patient portal called MyChart.  Sign up information is provided on this After Visit Summary.  MyChart is used to connect with patients for Virtual Visits (Telemedicine).  Patients are able to view lab/test results, encounter notes, upcoming appointments, etc.  Non-urgent messages can be sent to your provider as well.   To learn more about what you can do with MyChart, go to [email or web address].   Other Instructions  Your physician wants you to follow-up in: 6 months.  You will receive a reminder letter in the mail two months in advance. If you don't receive a letter, please call our office to schedule the follow-up appointment.

## 2024-07-21 NOTE — Progress Notes (Signed)
 " Cardiology Office Note:  .   Date:  07/21/2024  ID:  Haley [name], DOB Feb 17, 1934, MRN [id number] PCP: Dayna Motto, DO  [facility] Providers Cardiologist:  Annabella Scarce, MD Electrophysiologist:  Fonda Kitty, MD    History of Present Illness: Haley [name]    Haley [name] is a 89 y.o. female with hypertension, hyperlipidemia, mild carotid stenosis, SVT, aortic atherosclerosis, ascending and descending thoracic aortic aneurysm, splenic artery aneurysm, lung cancer status post wedge resection, remote breast cancer, hiatal hernia, and GERD here for follow-up.  She has been followed by Dr. Burnard for SVT and is now here to be at a closer office.  In the past she has been treated with verapamil  and beta-blockers.  She wore a monitor in 2019 that revealed isolated PACs and PVCs with no significant arrhythmias.  She saw Dr. Mona for an urgent visit 06/2021 after experiencing more palpitations.  She had taken some of her husbands medication which resulted in bradycardia.  She last saw Dr. Burnard 08/2021 and was taking her beta-blocker only on an as-needed basis.  She rarely needed to use it.  She did report both high and low heart rates and was using metoprolol  only on an as-needed basis.  She is taking verapamil  daily.  She had an echo 02/2021 that revealed LVEF 60 to 65% with indeterminate diastolic function.  She had mild aortic valve sclerosis without stenosis.  She has a history of a 4.5 cm descending thoracic aortic aneurysm.  CT of the chest 03/2022 revealed a stable 4.6 cm aneurysm of the ascending aorta.     At her visit 05/2022 she reported increased palpitations.  She started metoprolol  12.5 mg every 12 hours but thought that her heart rate was getting too low.  She was feeling more tired.  Based on her Apple Watch she was concern for atrial fibrillation but no atrial fibrillation was noted.  She wore a 7-day ZIO that showed frequent episodes of SVT.  The longest lasted 18 seconds.  Maximum  heart rate was 218 bpm.  She had rare PVCs.  Her minimum heart rate was 39 bpm.  She followed up with Reche Finder, NP 07/2022 and she was not interested in starting any antiarrhythmic medications.  Blood pressure was slightly elevated at home and she was asked to track it.   At her visit 11/2022 overall she was feeling better.  She participated in the PREP program.  Palpitations were better controlled on verapamil .  She saw Reche Finder, NP 06/2023 and was not exercising regularly after finishing the PREP program.  She saw Dr. Kerrin 10/2023 and was stable.  He recommended 6 month f/u CT.   At her 12/2023 visit she noted dizziness upon standing.  She wore a 30 day monitor that revealed frequent PACs and blocked PACs with pauses up to 4 seconds overnight and in the early morning.  She saw EP and verapamil  was reduced to 120mg  daily.  She saw Rosaline Bane, NP 04/2024 and continued to to have lightheadedness in the AM. She was seen in afib clinic 06/2022 after she was noted to be in atrial fibrillation at pre-surgical testing.  She was started on Eliquis  and metoprolol  was added.  She wore another monitor that revealed 15% atrial fibrillation burden lasting up to 4h 43 min.  She is scheduled to follow up with EP next month to discuss.   She underwent bronch with biopsy for changes noted to a complex ground glass opacity on her chest  CT.  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 Kerrin recommended radiation.  Dr. Kerrin plans to see her 12/2023 for this and surveillance of her 4.9 cm ascending aorta aneurysm.   Discussed the use of AI scribe software for clinical note transcription with the patient, who gave verbal consent to proceed. History of Present Illness [name] is dealing with her cancer reoccurrence, for which radiation therapy has been recommended. She has an upcoming appointment on the 31st to discuss this further. She has been coughing up a sticky  [place] substance for about a year, which has worsened over the past three months. She associates this symptom with her cancer diagnosis and has experienced weight loss and constipation, which she attributes to the cancer.  Additionally, she has a dental issue following a tooth extraction. Despite seeing a dentist and undergoing x-rays, the area remains sensitive and sore. She is concerned about the possibility of cancer affecting this area. She plans to follow up with her dentist for further evaluation.  In terms of her general health, she feels physically good overall, although she experiences some stomach discomfort. Her breathing is okay, and her blood pressure is stable. She uses a watch to monitor her heart rate, which has not been as low as expected but is not excessively high.  ROS:  As per HPI  Studies Reviewed: .       Echo 07/05/24: 1. Left ventricular ejection fraction, by estimation, is 55 to 60%. The  left ventricle has normal function. The left ventricle has no regional  wall motion abnormalities. There is mild asymmetric left ventricular  hypertrophy of the basal-septal segment.  Left ventricular diastolic parameters were normal. The average left  ventricular global longitudinal strain is -18.7 %. The global longitudinal  strain is normal.   2. Right ventricular systolic function is normal. The right ventricular  size is normal. There is normal pulmonary artery systolic pressure.   3. Left atrial size was mildly dilated.   4. Right atrial size was mildly dilated.   5. The mitral valve is normal in structure. Mild mitral valve  regurgitation. No evidence of mitral stenosis.   6. The aortic valve is tricuspid. Aortic valve regurgitation is moderate.  No aortic stenosis is present. Aortic regurgitation PHT measures 436 msec.   7. Aortic dilatation noted. There is mild dilatation of the ascending  aorta, measuring 40 mm.   8. The inferior vena cava is normal in size with  greater than 50%  respiratory variability, suggesting right atrial pressure of 3 mmHg.   01/12/24:  Total cholesterol 156, triglycerides 81, HDL 83, LDL 58  Risk Assessment/Calculations:             Physical Exam:   VS:  BP 130/72 (BP Location: Left Arm, Patient Position: Sitting, Cuff Size: Normal)   Pulse 71   Ht 5' 6 (1.676 m)   Wt 119 lb (54 kg)   SpO2 98%   BMI 19.21 kg/m  , BMI Body mass index is 19.21 kg/m. GENERAL:  Well appearing HEENT: Pupils equal round and reactive, fundi not visualized, oral mucosa unremarkable NECK:  No jugular venous distention, waveform within normal limits, carotid upstroke brisk and symmetric, no bruits, no thyromegaly LUNGS:  Clear to auscultation bilaterally HEART:  RRR.  PMI not displaced or sustained,S1 and S2 within normal limits, no S3, no S4, no clicks, no rubs, no murmurs ABD:  Flat, positive bowel sounds normal in frequency in pitch, no bruits,  no rebound, no guarding, no midline pulsatile mass, no hepatomegaly, no splenomegaly EXT:  2 plus pulses throughout, no edema, no cyanosis no clubbing SKIN:  No rashes no nodules NEURO:  Cranial nerves II through XII grossly intact, motor grossly intact throughout PSYCH:  Cognitively intact, oriented to person place and time   ASSESSMENT AND PLAN: .    Assessment & Plan # Paroxysmal atrial fibrillation # PACs: New onset AFib with heart rate episodes over 120 bpm. Managed with metoprolol  and verapamil . Anticoagulation with Eliquis  initiated. Heart rate stabilizing. - Continue verapamil  at night. - Use metoprolol  as needed for heart rate over 120 bpm. - Continue Eliquis  for anticoagulation.  # Primary hypertension Blood pressure well-controlled. - Continue losartan  and verapamil .   # Pure hypercholesterolemia Cholesterol levels within target range.  Continue atorvastatin .           Dispo: f/u 6 months  Signed, Annabella Scarce, MD    "

## 2024-07-25 NOTE — Progress Notes (Signed)
 Location of tumor and Histology per Pathology Report:  Right upper lobe  Biopsy:    Past/Anticipated interventions by surgeon, if any:    Past/Anticipated interventions by medical oncology/ Pulmonology, if any:     Pain issues, if any:  {:18581} {PAIN DESCRIPTION:21022940}  SAFETY ISSUES: Prior radiation? {:18581} Pacemaker/ICD? {:18581} Possible current pregnancy? no Is the patient on methotrexate? {:18581}  Current Complaints / other details:  ***     ***

## 2024-07-25 NOTE — Progress Notes (Incomplete)
 " Radiation Oncology         [phone] ________________________________  Initial Outpatient Consultation  Name: Haley [name] MRN: [id number]  Date: 07/26/2024  DOB: 1933-08-02  RR:Tzoanmw, Bernardino, DO  Kerrin Elspeth BROCKS, MD   REFERRING PHYSICIAN: Kerrin Elspeth BROCKS, MD  DIAGNOSIS: There were no encounter diagnoses.  Recurrent Adenocarcinoma of right lung   HISTORY OF PRESENT ILLNESS::Haley [name] is a 89 y.o. female who is accompanied by ***. she is seen as a courtesy of Dr. Kerrin for an opinion concerning radiation therapy as part of management for her recurrent lung cancer. The patient has a history of stage Ia adenocarcinoma diagnosed in 2014 s/p left upper lobe wedge resection. She has been under surveillance since then.  During her surveillance scan done in 2020 a complex ground glass opacity was noted in the right upper lobe which she been followed since then. During her most recent chest CT performed on 07/16/24 showed a subsolid right upper lobe spiculated pulmonary nodule is stable from recent prior examinations but slowly growing and increasing in solid component dating back to at least 2019. Nodule currently measures 21 x 1.1 cm with findings are concerning for indolent primary bronchogenic adenocarcinoma. Scan also noted an elongated nodular focus in the right middle lobe measures 2.2 x 1.0 cm previously 2.1 x 1.1 cm, favored nodular atelectasis along with an unchanged 4.9 cm aneurysmal dilation of the ascending thoracic aorta.  Subsequently, a PET scan was performed on 05/29/24 showing hypermetabolic activity in the subsolid right apical nodule is stable to prior and mildly FDG avid. Adenocarcinoma spectrum (lepidic variant) can not be excluded and may represent as low FDG avid lesions. This finding is gradually enlarging and appearing more solid compared to prior exams. Wedge resection changes in left upper lobe without suspicious metabolic activity.  In light  of findings, she was agreeable to undergo a bronchoscopy with biopsy which she underwent on 07/07/24 under the care of Dr. Kerrin. Surgical pathology of FNA and brushing of RML indicated adenocarcinoma. Immunostain for TTF1 is positive in the tumor cells while immunostains for p63 and CK5/6 are negative in the tumor cells.    During her most recent visit with Dr. Kerrin on 07/14/24 they discussed further treatment options including surgical options and radiation oncology. Patient stated that she is not interested in a surgical intervention at this time.   PREVIOUS RADIATION THERAPY: {EXAM; YES/NO:19492::No}  PAST MEDICAL HISTORY:  Past Medical History:  Diagnosis Date   Arthritis    knees and left hip   Atrophic gastritis without mention of hemorrhage    Breast cancer (HCC)    Bronchitis, not specified as acute or chronic    Cancer (HCC)    Lung cancer   Cardiac dysrhythmia, unspecified    Cataract    Chronic pancreatitis (HCC)    Complication of anesthesia    Cough    Dysrhythmia    Esophageal reflux    Family history of malignant neoplasm of gastrointestinal tract    Fatty liver    Flatulence, eructation, and gas pain    Gastric erosions    [place]   Hiatal hernia    Hoarseness    since virus 2 months agp   Irritable bowel syndrome    Irritable bowel syndrome    LGSIL (low grade squamous intraepithelial dysplasia) 11/15/2014   with biopsy of  benign vaginal cyst   Malignant neoplasm of breast (female), unspecified site    Skin Cancer legs, face, shoulder, some  basal some squamous   Other chest pain    Personal history of radiation therapy    Pneumonia, organism unspecified(486)    PONV (postoperative nausea and vomiting)    with hemorrhoid surgery years ago, no n/v with breast surgery   Pure hypercholesterolemia    Shortness of breath    with Exertion   Sorethroat    since virus 2 months ago   Symptomatic menopausal or female climacteric states     Thoracic aortic aneurysm without rupture 08/29/2012   08/29/12 discovered on PET/CT   Tubular adenoma of colon    Unspecified essential hypertension    Urinary tract infection, site not specified     PAST SURGICAL HISTORY: Past Surgical History:  Procedure Laterality Date   BREAST LUMPECTOMY Right 03/23/2003   w/ radiation. no chemo.   BREAST SURGERY     BRONCHIAL BIOPSY  07/07/2024   Procedure: BRONCHOSCOPY, WITH BIOPSY;  Surgeon: Kerrin Elspeth BROCKS, MD;  Location: [facility] ENDOSCOPY;  Service: Thoracic;;   BRONCHIAL BRUSHINGS  07/07/2024   Procedure: BRONCHOSCOPY, WITH BRUSH BIOPSY;  Surgeon: Kerrin Elspeth BROCKS, MD;  Location: [facility] ENDOSCOPY;  Service: Thoracic;;   BRONCHIAL NEEDLE ASPIRATION BIOPSY  07/07/2024   Procedure: BRONCHOSCOPY, WITH NEEDLE ASPIRATION BIOPSY;  Surgeon: Kerrin Elspeth BROCKS, MD;  Location: [facility] ENDOSCOPY;  Service: Thoracic;;   CATARACT EXTRACTION     Bilateral   COLONOSCOPY     EYE SURGERY     HEMORROIDECTOMY     SKIN CANCER EXCISION     VIDEO ASSISTED THORACOSCOPY (VATS)/WEDGE RESECTION Left 09/19/2012   Procedure: LEFT VIDEO ASSISTED THORACOSCOPY (VATS)/LEFT UPPER LOBE WEDGE RESECTION & NODE DISSECTION;  Surgeon: Elspeth BROCKS Kerrin, MD;  Location: [facility] OR;  Service: Thoracic;  Laterality: Left;   VIDEO BRONCHOSCOPY WITH ENDOBRONCHIAL NAVIGATION N/A 07/07/2024   Procedure: VIDEO BRONCHOSCOPY WITH ENDOBRONCHIAL NAVIGATION;  Surgeon: Kerrin Elspeth BROCKS, MD;  Location: [facility] ENDOSCOPY;  Service: Thoracic;  Laterality: N/A;  ROBOTIC BRONCHOSCOPY    FAMILY HISTORY:  Family History  Problem Relation Age of Onset   Heart disease Mother    Colon cancer Father 56   Cancer Father    Stroke Father    Heart disease Brother    Cancer Brother        mass behind lung-died age 59   Multiple myeloma Brother 72       died age 70   Liver cancer Brother    Diabetes Paternal Aunt    Pancreatic cancer Neg Hx    Stomach cancer Neg Hx    Esophageal cancer Neg Hx      SOCIAL HISTORY: Social History[1]  ALLERGIES: Allergies[2]  MEDICATIONS:  Current Outpatient Medications  Medication Sig Dispense Refill   apixaban  (ELIQUIS ) 2.5 MG TABS tablet Take 1 tablet (2.5 mg total) by mouth 2 (two) times daily. 180 tablet 2   atorvastatin  (LIPITOR) 40 MG tablet TAKE 1 TABLET EVERY DAY 90 tablet 3   b complex vitamins tablet Take 1 tablet by mouth daily.     cholecalciferol (VITAMIN D3) 25 MCG (1000 UNIT) tablet Take 1,000 Units by mouth 2 (two) times daily.     Coenzyme Q10 (COQ10 PO) Take 10 mLs by mouth daily.     losartan  (COZAAR ) 50 MG tablet TAKE 1 TABLET EVERY MORNING AND 1/2 TABLET EVERY EVENING 135 tablet 3   metoprolol  tartrate (LOPRESSOR ) 25 MG tablet Take 0.5 tablets (12.5 mg total) by mouth 2 (two) times daily. As needed for palpitations 30 tablet 0   OVER THE  COUNTER MEDICATION Take 2 capsules by mouth daily. Emma digestive supplement     OVER THE COUNTER MEDICATION Take 1 capsule by mouth at bedtime. Omega XL     Probiotic Product (PROBIOTIC DAILY PO) Take 1 capsule by mouth every evening.     verapamil  (CALAN -SR) 120 MG CR tablet Take 1 tablet (120 mg total) by mouth at bedtime. 90 tablet 3   No current facility-administered medications for this visit.    REVIEW OF SYSTEMS:  A 10+ POINT REVIEW OF SYSTEMS WAS OBTAINED including neurology, dermatology, psychiatry, cardiac, respiratory, lymph, extremities, GI, GU, musculoskeletal, constitutional, reproductive, HEENT. ***   PHYSICAL EXAM:  vitals were not taken for this visit.   General: Alert and oriented, in no acute distress HEENT: Head is normocephalic. Extraocular movements are intact. Oropharynx is clear. Neck: Neck is supple, no palpable cervical or supraclavicular lymphadenopathy. Heart: Regular in rate and rhythm with no murmurs, rubs, or gallops. Chest: Clear to auscultation bilaterally, with no rhonchi, wheezes, or rales. Abdomen: Soft, nontender, nondistended, with no rigidity or  guarding. Extremities: No cyanosis or edema. Lymphatics: see Neck Exam Skin: No concerning lesions. Musculoskeletal: symmetric strength and muscle tone throughout. Neurologic: Cranial nerves II through XII are grossly intact. No obvious focalities. Speech is fluent. Coordination is intact. Psychiatric: Judgment and insight are intact. Affect is appropriate. ***  ECOG = ***  0 - Asymptomatic (Fully active, able to carry on all predisease activities without restriction)  1 - Symptomatic but completely ambulatory (Restricted in physically strenuous activity but ambulatory and able to carry out work of a light or sedentary nature. For example, light housework, office work)  2 - Symptomatic, <50% in bed during the day (Ambulatory and capable of all self care but unable to carry out any work activities. Up and about more than 50% of waking hours)  3 - Symptomatic, >50% in bed, but not bedbound (Capable of only limited self-care, confined to bed or chair 50% or more of waking hours)  4 - Bedbound (Completely disabled. Cannot carry on any self-care. Totally confined to bed or chair)  5 - Death   Raylene MM, Creech RH, Tormey DC, et al. [phone]). Toxicity and response criteria of the [facility]. Am. DOROTHA Bridges. Oncol. 5 (6): 649-55  LABORATORY DATA:  Lab Results  Component Value Date   WBC 4.1 07/07/2024   HGB 12.2 07/07/2024   HCT 37.1 07/07/2024   MCV 95.6 07/07/2024   PLT 260 07/07/2024   NEUTROABS 3.4 04/08/2021   Lab Results  Component Value Date   NA 139 07/07/2024   K 4.2 07/07/2024   CL 103 07/07/2024   CO2 25 07/07/2024   GLUCOSE 90 07/07/2024   BUN 15 07/07/2024   CREATININE 0.94 07/07/2024   CALCIUM  9.5 07/07/2024      RADIOGRAPHY: LONG TERM MONITOR (3-14 DAYS) Result Date: 07/16/2024 Patch Wear Time:  6 days and 16 hours (2025-12-03T15:39:03-499 to 2025-12-10T08:04:29-0500) HR 37 - 157, average 74 bpm. Atrial fibrillation detected. Burden 15%,  longest episode 4 hours and 43 minutes. Frequent supraventricular ectopy. Rare ventricular ectopy. No symptom trigger episodes. Fonda Kitty Cardiac Electrophysiology  DG CHEST PORT 1 VIEW Result Date: 07/07/2024 CLINICAL DATA:  Status post bronchoscopy. EXAM: PORTABLE CHEST 1 VIEW COMPARISON:  07/07/2024 FINDINGS: Lungs are hyperexpanded. The lungs are clear without focal pneumonia, edema, pneumothorax or pleural effusion. Cardiopericardial silhouette is at upper limits of normal for size. No acute bony abnormality. Telemetry leads overlie the chest. IMPRESSION: Hyperexpansion without acute  cardiopulmonary findings. Electronically Signed   By: Camellia Candle M.D.   On: 07/07/2024 15:13   DG C-Arm 1-60 Min-No Report Result Date: 07/07/2024 Fluoroscopy was utilized by the requesting physician.  No radiographic interpretation.   DG C-ARM BRONCHOSCOPY Result Date: 07/07/2024 C-ARM BRONCHOSCOPY: Fluoroscopy was utilized by the requesting physician.  No radiographic interpretation.   DG Chest 2 View Result Date: 07/07/2024 CLINICAL DATA:  Preop testing EXAM: CHEST - 2 VIEW COMPARISON:  Ten days ago FINDINGS: The heart size and mediastinal contours are within normal limits. Both lungs are clear. The visualized skeletal structures are unremarkable. IMPRESSION: No active cardiopulmonary disease. Electronically Signed   By: Lynwood Landy Raddle M.D.   On: 07/07/2024 11:59   ECHOCARDIOGRAM COMPLETE Result Date: 07/06/2024    ECHOCARDIOGRAM REPORT   Patient Name:   Haley [name] Date of Exam: 07/05/2024 Medical Rec #:  [id number]       Height:       66.0 in Accession #:    [id number]      Weight:       120.4 lb Date of Birth:  [birth year]      BSA:          1.612 m Patient Age:    89 years        BP:           133/73 mmHg Patient Gender: F               HR:           50 bpm. Exam Location:  [address] Procedure: 2D Echo, 3D Echo, Cardiac Doppler, Color Doppler and Strain Analysis            (Both Spectral  and Color Flow Doppler were utilized during            procedure). Indications:    I35.1 Aortic insufficiency  History:        Patient has prior history of Echocardiogram examinations, most                 recent 03/17/2021. Breast Cancer, Aortic aneurysm; Risk                 Factors:Hypertension and HLD.  Sonographer:    Waldo Guadalajara RCS Referring Phys: [id number] ROSALINE HERO SWINYER IMPRESSIONS  1. Left ventricular ejection fraction, by estimation, is 55 to 60%. The left ventricle has normal function. The left ventricle has no regional wall motion abnormalities. There is mild asymmetric left ventricular hypertrophy of the basal-septal segment. Left ventricular diastolic parameters were normal. The average left ventricular global longitudinal strain is -18.7 %. The global longitudinal strain is normal.  2. Right ventricular systolic function is normal. The right ventricular size is normal. There is normal pulmonary artery systolic pressure.  3. Left atrial size was mildly dilated.  4. Right atrial size was mildly dilated.  5. The mitral valve is normal in structure. Mild mitral valve regurgitation. No evidence of mitral stenosis.  6. The aortic valve is tricuspid. Aortic valve regurgitation is moderate. No aortic stenosis is present. Aortic regurgitation PHT measures 436 msec.  7. Aortic dilatation noted. There is mild dilatation of the ascending aorta, measuring 40 mm.  8. The inferior vena cava is normal in size with greater than 50% respiratory variability, suggesting right atrial pressure of 3 mmHg. FINDINGS  Left Ventricle: Left ventricular ejection fraction, by estimation, is 55 to 60%. The left ventricle has normal function. The left ventricle has no  regional wall motion abnormalities. The average left ventricular global longitudinal strain is -18.7 %. Strain was performed and the global longitudinal strain is normal. 3D ejection fraction reviewed and evaluated as part of the interpretation. Alternate measurement  of EF is felt to be most reflective of LV function. The left ventricular internal cavity size was normal in size. There is mild asymmetric left ventricular hypertrophy of the basal-septal segment. Left ventricular diastolic parameters were normal. Right Ventricle: The right ventricular size is normal. No increase in right ventricular wall thickness. Right ventricular systolic function is normal. There is normal pulmonary artery systolic pressure. The tricuspid regurgitant velocity is 2.55 m/s, and  with an assumed right atrial pressure of 3 mmHg, the estimated right ventricular systolic pressure is 29.0 mmHg. Left Atrium: Left atrial size was mildly dilated. Right Atrium: Right atrial size was mildly dilated. Pericardium: There is no evidence of pericardial effusion. Mitral Valve: The mitral valve is normal in structure. Mild mitral valve regurgitation. No evidence of mitral valve stenosis. Tricuspid Valve: The tricuspid valve is normal in structure. Tricuspid valve regurgitation is mild . No evidence of tricuspid stenosis. Aortic Valve: The aortic valve is tricuspid. Aortic valve regurgitation is moderate. Aortic regurgitation PHT measures 436 msec. No aortic stenosis is present. Pulmonic Valve: The pulmonic valve was normal in structure. Pulmonic valve regurgitation is mild. No evidence of pulmonic stenosis. Aorta: Aortic dilatation noted. There is mild dilatation of the ascending aorta, measuring 40 mm. Venous: The inferior vena cava is normal in size with greater than 50% respiratory variability, suggesting right atrial pressure of 3 mmHg. IAS/Shunts: No atrial level shunt detected by color flow Doppler. Additional Comments: 3D was performed not requiring image post processing on an independent workstation and was normal.  LEFT VENTRICLE PLAX 2D LVIDd:         4.60 cm   Diastology LVIDs:         2.90 cm   LV e' medial:    7.94 cm/s LV PW:         1.10 cm   LV E/e' medial:  11.4 LV IVS:        1.10 cm   LV e'  lateral:   12.10 cm/s LVOT diam:     2.00 cm   LV E/e' lateral: 7.5 LV SV:         99 LV SV Index:   62        2D Longitudinal Strain LVOT Area:     3.14 cm  2D Strain GLS Avg:     -18.7 % LV IVRT:       85 msec                           3D Volume EF:                          3D EF:        53 %                          LV EDV:       112 ml                          LV ESV:       52 ml  LV SV:        59 ml RIGHT VENTRICLE RV Basal diam:  3.40 cm    PULMONARY VEINS RV S prime:     5.00 cm/s  A Reversal Velocity: 23.60 cm/s TAPSE (M-mode): 2.3 cm     Diastolic Velocity:  53.80 cm/s RVSP:           29.0 mmHg  S/D Velocity:        1.00                            Systolic Velocity:   52.80 cm/s LEFT ATRIUM             Index        RIGHT ATRIUM           Index LA diam:        3.40 cm 2.11 cm/m   RA Pressure: 3.00 mmHg LA Vol (A2C):   50.3 ml 31.21 ml/m  RA Area:     19.90 cm LA Vol (A4C):   55.5 ml 34.43 ml/m  RA Volume:   58.50 ml  36.29 ml/m LA Biplane Vol: 57.5 ml 35.67 ml/m  AORTIC VALVE LVOT Vmax:   127.00 cm/s LVOT Vmean:  81.900 cm/s LVOT VTI:    0.316 m AI PHT:      436 msec  AORTA Ao Root diam: 3.60 cm Ao Asc diam:  4.00 cm MITRAL VALVE               TRICUSPID VALVE MV Area (PHT):             TR Peak grad:   26.0 mmHg MV Decel Time:             TR Vmax:        255.00 cm/s MV E velocity: 90.70 cm/s  Estimated RAP:  3.00 mmHg MV A velocity: 64.50 cm/s  RVSP:           29.0 mmHg MV E/A ratio:  1.41                            SHUNTS                            Systemic VTI:  0.32 m                            Systemic Diam: 2.00 cm Morene Brownie Electronically signed by Morene Brownie Signature Date/Time: 07/06/2024/11:45:49 AM    Final    DG Chest 2 View Result Date: 06/27/2024 CLINICAL DATA:  Lung nodules.  Preop chest exam. EXAM: CHEST - 2 VIEW COMPARISON:  Radiograph 10/11/2012, CT 05/16/2024 FINDINGS: The cardiomediastinal contours are normal. The right upper and middle lobe  nodules on CT are not well demonstrated by radiograph. Pulmonary vasculature is normal. No consolidation, pleural effusion, or pneumothorax. No acute osseous abnormalities are seen. IMPRESSION: 1. No acute chest findings. 2. Right upper and middle lobe nodules on CT are not well demonstrated by radiograph. Electronically Signed   By: Andrea Gasman M.D.   On: 06/27/2024 21:42      IMPRESSION: Recurrent Adenocarcinoma of right lung   ***  Today, I talked to the patient and family about the findings and work-up thus far.  We discussed the natural history of *** and general  treatment, highlighting the role of radiotherapy in the management.  We discussed the available radiation techniques, and focused on the details of logistics and delivery.  We reviewed the anticipated acute and late sequelae associated with radiation in this setting.  The patient was encouraged to ask questions that I answered to the best of my ability. *** A patient consent form was discussed and signed.  We retained a copy for our records.  The patient would like to proceed with radiation and will be scheduled for CT simulation.  PLAN: ***    *** minutes of total time was spent for this patient encounter, including preparation, face-to-face counseling with the patient and coordination of care, physical exam, and documentation of the encounter.   ------------------------------------------------  Lynwood CHARM Nasuti, PhD, MD  This document serves as a record of services personally performed by Lynwood Nasuti, MD. It was created on his behalf by Reymundo Cartwright, a trained medical scribe. The creation of this record is based on the scribe's personal observations and the provider's statements to them. This document has been checked and approved by the attending provider.     [1]  Social History Tobacco Use   Smoking status: Never   Smokeless tobacco: Never   Tobacco comments:    Never smoked 06/28/24  Vaping Use   Vaping status:  Never Used  Substance Use Topics   Alcohol use: No    Alcohol/week: 0.0 standard drinks of alcohol   Drug use: No  [2] No Known Allergies  "

## 2024-07-26 ENCOUNTER — Ambulatory Visit
Admission: RE | Admit: 2024-07-26 | Discharge: 2024-07-26 | Disposition: A | Discharge status: Home / Self Care | Source: Ambulatory Visit | Attending: Radiation Oncology | Admitting: Radiation Oncology

## 2024-07-26 ENCOUNTER — Encounter: Payer: Self-pay | Admitting: Radiation Oncology

## 2024-07-26 VITALS — BP 129/83 | HR 57 | Temp 97.5°F | Resp 17 | Ht 65.0 in | Wt 117.0 lb

## 2024-07-26 DIAGNOSIS — R918 Other nonspecific abnormal finding of lung field: Secondary | ICD-10-CM

## 2024-07-26 DIAGNOSIS — C3411 Malignant neoplasm of upper lobe, right bronchus or lung: Secondary | ICD-10-CM

## 2024-07-26 DIAGNOSIS — C342 Malignant neoplasm of middle lobe, bronchus or lung: Secondary | ICD-10-CM

## 2024-07-31 NOTE — Progress Notes (Addendum)
 "  Primary Care Physician: Dayna Motto, DO Primary Cardiologist: Annabella Scarce, MD Electrophysiologist: Fonda Kitty, MD  Referring Physician: Annabella Scarce, MD   Haley [name] is a 89 y.o. female with a history of atrial tach, HTN, HLD, recent recurrent lung CA s/p wedge resection, breast CA aortic atherosclerosis, thoracic aortic dilation, who presents today for 1 month follow-up.  Haley [name] was last seen in clinic on 06/28/2024 for evaluation of atrial fibrillation which was discovered during preadmission testing prior to bronchoscopy on 06/30/2024.  During her follow-up on 06/28/2024 she was maintaining sinus rhythm with controlled heart rate. She was started on Eliquis  2.5 mg and continued on rate control with verapamil  120 mg with Toprol -XL 12.5 mg added for elevated heart rate.  She wore a 1 week ZIO monitor that showed 15% burden of atrial fibrillation with frequent SVT and no triggered patient episodes.  She underwent a robotic bronchoscopy procedure with Dr. Kerrin on 07/07/2024 that unfortunately showed recurrence of lung cancer with recommendation of radiation therapy.  She was seen by Dr. Scarce on 07/21/2024 and was maintaining stable heart rate on verapamil  and as needed metoprolol .  She was tolerating Eliquis  without any adverse reactions at that time.  Haley [name] presents today with her son for her 1 month follow-up. During today's visit she is maintaining sinus rhythm by EKG and reports no missed doses of her verapamil  or Eliquis . She is scheduled to begin radiation therapy for  recurrent lung cancer this month.  She continues use her Apple Watch to monitor for recurrences of atrial fibrillation.  We discussed the possibility of atrial fibrillation occurring due to treatment and discussed medication options to use in the future if this occurs.Today, she denies symptoms of palpitations, chest pain, shortness of breath,orthopnea, PND, lower extremity edema, dizziness,  presyncope, syncope, snoring, daytime somnolence, bleeding, or neurologic sequela. The patient is tolerating medications without difficulties and is otherwise without complaint today.   Atrial Fibrillation Management history:  Previous antiarrhythmic drugs: None Previous cardioversions: None Previous ablations: None Anticoagulation history: Eliquis   ROS- All systems are reviewed and negative except as per the HPI above.  Past Medical History:  Diagnosis Date   Arthritis    knees and left hip   Atrophic gastritis without mention of hemorrhage    Breast cancer (HCC)    Bronchitis, not specified as acute or chronic    Cancer (HCC)    Lung cancer   Cardiac dysrhythmia, unspecified    Cataract    Chronic pancreatitis (HCC)    Complication of anesthesia    Cough    Dysrhythmia    Esophageal reflux    Family history of malignant neoplasm of gastrointestinal tract    Fatty liver    Flatulence, eructation, and gas pain    Gastric erosions    Cameron   Hiatal hernia    Hoarseness    since virus 2 months agp   Irritable bowel syndrome    Irritable bowel syndrome    LGSIL (low grade squamous intraepithelial dysplasia) 11/15/2014   with biopsy of  benign vaginal cyst   Malignant neoplasm of breast (female), unspecified site    Skin Cancer legs, face, shoulder, some basal some squamous   Other chest pain    Personal history of radiation therapy    Pneumonia, organism unspecified(486)    PONV (postoperative nausea and vomiting)    with hemorrhoid surgery years ago, no n/v with breast surgery   Pure hypercholesterolemia    Shortness of breath  with Exertion   Sorethroat    since virus 2 months ago   Symptomatic menopausal or female climacteric states    Thoracic aortic aneurysm without rupture 08/29/2012   08/29/12 discovered on PET/CT   Tubular adenoma of colon    Unspecified essential hypertension    Urinary tract infection, site not specified     Current Outpatient  Medications  Medication Sig Dispense Refill   apixaban  (ELIQUIS ) 2.5 MG TABS tablet Take 1 tablet (2.5 mg total) by mouth 2 (two) times daily. 180 tablet 2   atorvastatin  (LIPITOR) 40 MG tablet TAKE 1 TABLET EVERY DAY 90 tablet 3   b complex vitamins tablet Take 1 tablet by mouth daily.     cholecalciferol (VITAMIN D3) 25 MCG (1000 UNIT) tablet Take 1,000 Units by mouth 2 (two) times daily.     Coenzyme Q10 (COQ10 PO) Take 10 mLs by mouth daily.     losartan  (COZAAR ) 50 MG tablet TAKE 1 TABLET EVERY MORNING AND 1/2 TABLET EVERY EVENING 135 tablet 3   metoprolol  tartrate (LOPRESSOR ) 25 MG tablet Take 0.5 tablets (12.5 mg total) by mouth 2 (two) times daily. As needed for palpitations 30 tablet 0   OVER THE COUNTER MEDICATION Take 2 capsules by mouth daily. Emma digestive supplement     OVER THE COUNTER MEDICATION Take 1 capsule by mouth at bedtime. Omega XL     Probiotic Product (PROBIOTIC DAILY PO) Take 1 capsule by mouth every evening.     verapamil  (CALAN -SR) 120 MG CR tablet Take 1 tablet (120 mg total) by mouth at bedtime. 90 tablet 3   No current facility-administered medications for this encounter.    Physical Exam: BP 118/86   Pulse (!) 59   Ht 5' 5 (1.651 m)   Wt 53.8 kg   BMI 19.74 kg/m   GEN: Well nourished, well developed in no acute distress NECK: No JVD; No carotid bruits CARDIAC: Regular rate and rhythm, no murmurs, rubs, gallops RESPIRATORY:  Clear to auscultation without rales, wheezing or rhonchi  ABDOMEN: Soft, non-tender, non-distended EXTREMITIES:  No edema; No deformity   Wt Readings from Last 3 Encounters:  08/01/24 53.8 kg  07/26/24 53.1 kg  07/21/24 54 kg     EKG today demonstrates:   EKG Interpretation Date/Time:  Tuesday August 01 2024 11:00:31 EST Ventricular Rate:  59 PR Interval:  172 QRS Duration:  86 QT Interval:  434 QTC Calculation: 429 R Axis:   37  Text Interpretation: Sinus bradycardia Confirmed by Wyn Manus [id number]) on  08/01/2024 11:34:39 AM        Echo Completed 07/06/2024: 1. Left ventricular ejection fraction, by estimation, is 55 to 60%. The  left ventricle has normal function. The left ventricle has no regional  wall motion abnormalities. There is mild asymmetric left ventricular  hypertrophy of the basal-septal segment.  Left ventricular diastolic parameters were normal. The average left  ventricular global longitudinal strain is -18.7 %. The global longitudinal  strain is normal.   2. Right ventricular systolic function is normal. The right ventricular  size is normal. There is normal pulmonary artery systolic pressure.   3. Left atrial size was mildly dilated.   4. Right atrial size was mildly dilated.   5. The mitral valve is normal in structure. Mild mitral valve  regurgitation. No evidence of mitral stenosis.   6. The aortic valve is tricuspid. Aortic valve regurgitation is moderate.  No aortic stenosis is present. Aortic regurgitation PHT measures 436 msec.  7. Aortic dilatation noted. There is mild dilatation of the ascending  aorta, measuring 40 mm.   8. The inferior vena cava is normal in size with greater than 50%  respiratory variability, suggesting right atrial pressure of 3 mmHg.   CHA2DS2-VASc Score = 5  The patient's score is based upon: CHF History: 0 HTN History: 1 Diabetes History: 0 Stroke History: 0 Vascular Disease History: 1 Age Score: 2 Gender Score: 1      ASSESSMENT AND PLAN: Paroxysmal Atrial Fibrillation (ICD10:  I48.0) The patient's CHA2DS2-VASc score is 5, indicating a 7.2% annual risk of stroke.   - 1 week ZIO monitor placed showing 15% AF burden and today patient is maintaining sinus rhythm with no recurrence of sustained atrial fibrillation since previous visit -She is scheduled to begin radiation therapy for recurrent lung cancer and we briefly discussed antiarrhythmic therapy and possible titration of verapamil  for rate control. - Continue Eliquis   2.5 mg twice daily - Continue verapamil  120 mg daily  Secondary Hypercoagulable State (ICD10:  D68.69) The patient is at significant risk for stroke/thromboembolism based upon her CHA2DS2-VASc Score of 5.  Continue Apixaban  (Eliquis ).   Essential HTN: BP well controlled. Continue current antihypertensive regimen.   Recurrent lung CA: - Recent bronchoscopy showing recurrent lung cancer with plan of radiation therapy set to begin this month.    Signed,  Wyn Raddle, Jackee Shove, NP    08/01/2024 11:35 AM     Follow up with the AF clinic in 6 months  "

## 2024-08-01 ENCOUNTER — Encounter (HOSPITAL_COMMUNITY): Payer: Self-pay | Admitting: Nurse Practitioner

## 2024-08-01 ENCOUNTER — Ambulatory Visit (HOSPITAL_COMMUNITY)
Admission: RE | Admit: 2024-08-01 | Discharge: 2024-08-01 | Disposition: A | Discharge status: Home / Self Care | Source: Ambulatory Visit | Attending: Nurse Practitioner | Admitting: Nurse Practitioner

## 2024-08-01 VITALS — BP 118/86 | HR 59 | Ht 65.0 in | Wt 118.6 lb

## 2024-08-01 DIAGNOSIS — I480 Paroxysmal atrial fibrillation: Secondary | ICD-10-CM

## 2024-08-01 DIAGNOSIS — D6859 Other primary thrombophilia: Secondary | ICD-10-CM

## 2024-08-01 DIAGNOSIS — I10 Essential (primary) hypertension: Secondary | ICD-10-CM | POA: Diagnosis not present

## 2024-08-02 ENCOUNTER — Ambulatory Visit
Admission: RE | Admit: 2024-08-02 | Discharge: 2024-08-02 | Disposition: A | Discharge status: Home / Self Care | Source: Ambulatory Visit | Attending: Radiation Oncology | Admitting: Radiation Oncology

## 2024-08-02 DIAGNOSIS — C3411 Malignant neoplasm of upper lobe, right bronchus or lung: Secondary | ICD-10-CM | POA: Insufficient documentation

## 2024-08-02 DIAGNOSIS — Z510 Encounter for antineoplastic radiation therapy: Secondary | ICD-10-CM | POA: Insufficient documentation

## 2024-08-14 ENCOUNTER — Ambulatory Visit
Admission: RE | Admit: 2024-08-14 | Discharge: 2024-08-14 | Disposition: A | Source: Ambulatory Visit | Attending: Radiation Oncology | Admitting: Radiation Oncology

## 2024-08-14 ENCOUNTER — Other Ambulatory Visit: Payer: Self-pay

## 2024-08-14 DIAGNOSIS — C3411 Malignant neoplasm of upper lobe, right bronchus or lung: Secondary | ICD-10-CM

## 2024-08-14 LAB — RAD ONC ARIA SESSION SUMMARY
Course Elapsed Days: 0
Plan Fractions Treated to Date: 1
Plan Prescribed Dose Per Fraction: 5 Gy
Plan Total Fractions Prescribed: 10
Plan Total Prescribed Dose: 50 Gy
Reference Point Dosage Given to Date: 5 Gy
Reference Point Session Dosage Given: 5 Gy
Session Number: 1

## 2024-08-15 ENCOUNTER — Ambulatory Visit
Admission: RE | Admit: 2024-08-15 | Discharge: 2024-08-15 | Disposition: A | Source: Ambulatory Visit | Attending: Radiation Oncology | Admitting: Radiation Oncology

## 2024-08-15 ENCOUNTER — Other Ambulatory Visit: Payer: Self-pay

## 2024-08-15 VITALS — BP 143/79 | HR 56 | Temp 97.0°F | Resp 18

## 2024-08-15 DIAGNOSIS — C3411 Malignant neoplasm of upper lobe, right bronchus or lung: Secondary | ICD-10-CM

## 2024-08-15 LAB — RAD ONC ARIA SESSION SUMMARY
Course Elapsed Days: 1
Plan Fractions Treated to Date: 1
Plan Fractions Treated to Date: 2
Plan Prescribed Dose Per Fraction: 18 Gy
Plan Prescribed Dose Per Fraction: 5 Gy
Plan Total Fractions Prescribed: 10
Plan Total Fractions Prescribed: 3
Plan Total Prescribed Dose: 50 Gy
Plan Total Prescribed Dose: 54 Gy
Reference Point Dosage Given to Date: 10 Gy
Reference Point Dosage Given to Date: 18 Gy
Reference Point Session Dosage Given: 18 Gy
Reference Point Session Dosage Given: 5 Gy
Session Number: 2

## 2024-08-16 ENCOUNTER — Ambulatory Visit

## 2024-08-16 ENCOUNTER — Other Ambulatory Visit: Payer: Self-pay

## 2024-08-16 ENCOUNTER — Ambulatory Visit
Admission: RE | Admit: 2024-08-16 | Discharge: 2024-08-16 | Disposition: A | Source: Ambulatory Visit | Attending: Radiation Oncology | Admitting: Radiation Oncology

## 2024-08-16 LAB — RAD ONC ARIA SESSION SUMMARY
Course Elapsed Days: 2
Plan Fractions Treated to Date: 3
Plan Prescribed Dose Per Fraction: 5 Gy
Plan Total Fractions Prescribed: 10
Plan Total Prescribed Dose: 50 Gy
Reference Point Dosage Given to Date: 15 Gy
Reference Point Session Dosage Given: 5 Gy
Session Number: 3

## 2024-08-17 ENCOUNTER — Other Ambulatory Visit: Payer: Self-pay

## 2024-08-17 ENCOUNTER — Telehealth: Payer: Self-pay | Admitting: Internal Medicine

## 2024-08-17 ENCOUNTER — Ambulatory Visit
Admission: RE | Admit: 2024-08-17 | Discharge: 2024-08-17 | Disposition: A | Discharge status: Home / Self Care | Source: Ambulatory Visit | Attending: Radiation Oncology | Admitting: Radiation Oncology

## 2024-08-17 DIAGNOSIS — C3411 Malignant neoplasm of upper lobe, right bronchus or lung: Secondary | ICD-10-CM

## 2024-08-17 LAB — RAD ONC ARIA SESSION SUMMARY
Course Elapsed Days: 3
Plan Fractions Treated to Date: 2
Plan Fractions Treated to Date: 4
Plan Prescribed Dose Per Fraction: 18 Gy
Plan Prescribed Dose Per Fraction: 5 Gy
Plan Total Fractions Prescribed: 10
Plan Total Fractions Prescribed: 3
Plan Total Prescribed Dose: 50 Gy
Plan Total Prescribed Dose: 54 Gy
Reference Point Dosage Given to Date: 20 Gy
Reference Point Dosage Given to Date: 36 Gy
Reference Point Session Dosage Given: 18 Gy
Reference Point Session Dosage Given: 5 Gy
Session Number: 4

## 2024-08-17 NOTE — Telephone Encounter (Signed)
 Rescheduled new patient appointments due to inclement weather on 1/26. The patients appointments have been moved to 1/29 and she is aware.   (An email has been sent to Radiation Oncology to potentially have her appointments moved closer together on 1/29.)

## 2024-08-18 ENCOUNTER — Other Ambulatory Visit: Payer: Self-pay

## 2024-08-18 ENCOUNTER — Ambulatory Visit
Admission: RE | Admit: 2024-08-18 | Discharge: 2024-08-18 | Disposition: A | Source: Ambulatory Visit | Attending: Radiation Oncology | Admitting: Radiation Oncology

## 2024-08-18 LAB — RAD ONC ARIA SESSION SUMMARY
Course Elapsed Days: 4
Plan Fractions Treated to Date: 5
Plan Prescribed Dose Per Fraction: 5 Gy
Plan Total Fractions Prescribed: 10
Plan Total Prescribed Dose: 50 Gy
Reference Point Dosage Given to Date: 25 Gy
Reference Point Session Dosage Given: 5 Gy
Session Number: 5

## 2024-08-21 ENCOUNTER — Ambulatory Visit: Admitting: Radiation Oncology

## 2024-08-21 ENCOUNTER — Inpatient Hospital Stay

## 2024-08-21 ENCOUNTER — Inpatient Hospital Stay: Admitting: Internal Medicine

## 2024-08-22 ENCOUNTER — Ambulatory Visit
Admission: RE | Admit: 2024-08-22 | Discharge: 2024-08-22 | Disposition: A | Source: Ambulatory Visit | Attending: Radiation Oncology | Admitting: Radiation Oncology

## 2024-08-22 ENCOUNTER — Ambulatory Visit
Admission: RE | Admit: 2024-08-22 | Discharge: 2024-08-22 | Attending: Radiation Oncology | Admitting: Radiation Oncology

## 2024-08-22 ENCOUNTER — Other Ambulatory Visit: Payer: Self-pay

## 2024-08-22 LAB — RAD ONC ARIA SESSION SUMMARY
Course Elapsed Days: 8
Plan Fractions Treated to Date: 3
Plan Fractions Treated to Date: 6
Plan Prescribed Dose Per Fraction: 18 Gy
Plan Prescribed Dose Per Fraction: 5 Gy
Plan Total Fractions Prescribed: 10
Plan Total Fractions Prescribed: 3
Plan Total Prescribed Dose: 50 Gy
Plan Total Prescribed Dose: 54 Gy
Reference Point Dosage Given to Date: 30 Gy
Reference Point Dosage Given to Date: 54 Gy
Reference Point Session Dosage Given: 18 Gy
Reference Point Session Dosage Given: 5 Gy
Session Number: 6

## 2024-08-23 ENCOUNTER — Other Ambulatory Visit: Payer: Self-pay

## 2024-08-23 ENCOUNTER — Other Ambulatory Visit: Payer: Self-pay | Admitting: Medical Oncology

## 2024-08-23 ENCOUNTER — Ambulatory Visit
Admission: RE | Admit: 2024-08-23 | Discharge: 2024-08-23 | Disposition: A | Source: Ambulatory Visit | Attending: Radiation Oncology | Admitting: Radiation Oncology

## 2024-08-23 DIAGNOSIS — C3410 Malignant neoplasm of upper lobe, unspecified bronchus or lung: Secondary | ICD-10-CM

## 2024-08-23 LAB — RAD ONC ARIA SESSION SUMMARY
Course Elapsed Days: 9
Plan Fractions Treated to Date: 7
Plan Prescribed Dose Per Fraction: 5 Gy
Plan Total Fractions Prescribed: 10
Plan Total Prescribed Dose: 50 Gy
Reference Point Dosage Given to Date: 35 Gy
Reference Point Session Dosage Given: 5 Gy
Session Number: 7

## 2024-08-24 ENCOUNTER — Inpatient Hospital Stay: Admitting: Internal Medicine

## 2024-08-24 ENCOUNTER — Ambulatory Visit
Admission: RE | Admit: 2024-08-24 | Discharge: 2024-08-24 | Disposition: A | Discharge status: Home / Self Care | Source: Ambulatory Visit | Attending: Radiation Oncology | Admitting: Radiation Oncology

## 2024-08-24 ENCOUNTER — Inpatient Hospital Stay: Attending: Internal Medicine

## 2024-08-24 ENCOUNTER — Other Ambulatory Visit: Payer: Self-pay

## 2024-08-24 VITALS — BP 130/68 | HR 81 | Temp 97.8°F | Resp 17 | Ht 65.0 in | Wt 120.0 lb

## 2024-08-24 DIAGNOSIS — Z823 Family history of stroke: Secondary | ICD-10-CM

## 2024-08-24 DIAGNOSIS — Z808 Family history of malignant neoplasm of other organs or systems: Secondary | ICD-10-CM | POA: Diagnosis not present

## 2024-08-24 DIAGNOSIS — Z833 Family history of diabetes mellitus: Secondary | ICD-10-CM

## 2024-08-24 DIAGNOSIS — Z8249 Family history of ischemic heart disease and other diseases of the circulatory system: Secondary | ICD-10-CM

## 2024-08-24 DIAGNOSIS — R059 Cough, unspecified: Secondary | ICD-10-CM | POA: Diagnosis not present

## 2024-08-24 DIAGNOSIS — R5383 Other fatigue: Secondary | ICD-10-CM | POA: Diagnosis not present

## 2024-08-24 DIAGNOSIS — Z79899 Other long term (current) drug therapy: Secondary | ICD-10-CM

## 2024-08-24 DIAGNOSIS — C3410 Malignant neoplasm of upper lobe, unspecified bronchus or lung: Secondary | ICD-10-CM

## 2024-08-24 DIAGNOSIS — C3490 Malignant neoplasm of unspecified part of unspecified bronchus or lung: Secondary | ICD-10-CM

## 2024-08-24 DIAGNOSIS — Z800 Family history of malignant neoplasm of digestive organs: Secondary | ICD-10-CM

## 2024-08-24 DIAGNOSIS — C3481 Malignant neoplasm of overlapping sites of right bronchus and lung: Secondary | ICD-10-CM | POA: Diagnosis not present

## 2024-08-24 DIAGNOSIS — C3411 Malignant neoplasm of upper lobe, right bronchus or lung: Secondary | ICD-10-CM | POA: Insufficient documentation

## 2024-08-24 DIAGNOSIS — Z809 Family history of malignant neoplasm, unspecified: Secondary | ICD-10-CM | POA: Diagnosis not present

## 2024-08-24 LAB — RAD ONC ARIA SESSION SUMMARY
Course Elapsed Days: 10
Plan Fractions Treated to Date: 8
Plan Prescribed Dose Per Fraction: 5 Gy
Plan Total Fractions Prescribed: 10
Plan Total Prescribed Dose: 50 Gy
Reference Point Dosage Given to Date: 40 Gy
Reference Point Session Dosage Given: 5 Gy
Session Number: 8

## 2024-08-24 LAB — CMP (CANCER CENTER ONLY)
ALT: 28 U/L (ref 0–44)
AST: 38 U/L (ref 15–41)
Albumin: 4.3 g/dL (ref 3.5–5.0)
Alkaline Phosphatase: 74 U/L (ref 38–126)
Anion gap: 12 (ref 5–15)
BUN: 24 mg/dL — ABNORMAL HIGH (ref 8–23)
CO2: 26 mmol/L (ref 22–32)
Calcium: 9.2 mg/dL (ref 8.9–10.3)
Chloride: 101 mmol/L (ref 98–111)
Creatinine: 1.06 mg/dL — ABNORMAL HIGH (ref 0.44–1.00)
GFR, Estimated: 50 mL/min — ABNORMAL LOW
Glucose, Bld: 92 mg/dL (ref 70–99)
Potassium: 4.4 mmol/L (ref 3.5–5.1)
Sodium: 138 mmol/L (ref 135–145)
Total Bilirubin: 1 mg/dL (ref 0.0–1.2)
Total Protein: 7.2 g/dL (ref 6.5–8.1)

## 2024-08-24 LAB — CBC WITH DIFFERENTIAL (CANCER CENTER ONLY)
Abs Immature Granulocytes: 0.01 10*3/uL (ref 0.00–0.07)
Basophils Absolute: 0 10*3/uL (ref 0.0–0.1)
Basophils Relative: 1 %
Eosinophils Absolute: 0 10*3/uL (ref 0.0–0.5)
Eosinophils Relative: 1 %
HCT: 34.9 % — ABNORMAL LOW (ref 36.0–46.0)
Hemoglobin: 11.7 g/dL — ABNORMAL LOW (ref 12.0–15.0)
Immature Granulocytes: 0 %
Lymphocytes Relative: 18 %
Lymphs Abs: 0.8 10*3/uL (ref 0.7–4.0)
MCH: 31.5 pg (ref 26.0–34.0)
MCHC: 33.5 g/dL (ref 30.0–36.0)
MCV: 93.8 fL (ref 80.0–100.0)
Monocytes Absolute: 0.5 10*3/uL (ref 0.1–1.0)
Monocytes Relative: 11 %
Neutro Abs: 3.3 10*3/uL (ref 1.7–7.7)
Neutrophils Relative %: 69 %
Platelet Count: 216 10*3/uL (ref 150–400)
RBC: 3.72 MIL/uL — ABNORMAL LOW (ref 3.87–5.11)
RDW: 14.3 % (ref 11.5–15.5)
WBC Count: 4.8 10*3/uL (ref 4.0–10.5)
nRBC: 0 % (ref 0.0–0.2)

## 2024-08-24 NOTE — Progress Notes (Signed)
 "   Haley [name] [phone]   Fax:([phone]  CONSULT NOTE  REFERRING PHYSICIAN: Dr. Elspeth [name]  REASON FOR CONSULTATION:  89 years old [place] female diagnosed with lung cancer.  HPI Haley [name] is a 89 y.o. female.   HPI  Discussed the use of AI scribe software for clinical note transcription with the patient, who gave verbal consent to proceed.  History of Present Illness Haley [name] is an 89 year old female with stage IIB non-small cell lung adenocarcinoma of the right lung who presents for oncology follow-up during ongoing radiation therapy.  She was diagnosed with recurrent non-small cell lung adenocarcinoma involving the right upper and middle lobes after surveillance imaging for thoracic aortic aneurysm revealed two slowly enlarging nodules. PET scan in November 2025 demonstrated hypermetabolic activity in both nodules, and bronchoscopy with biopsy in December 2025 confirmed adenocarcinoma, histologically similar to her prior lung cancer. She previously underwent wedge resection of the left upper lobe for adenocarcinoma in 2014 and was treated for breast cancer in the early 2000s.  She describes increased fatigue. She has had a chronic cough for over a year, productive of Haley [name] watery mucus and occasionally thicker yellow mucus. She denies hemoptysis, chest pain, or shortness of breath. She denies nausea, vomiting, diarrhea, or headaches. She notes longstanding constipation predating her current cancer diagnosis. No new or worsening symptoms affecting her daily activities. Family history significant for mother with heart disease.  Father had colon cancer and stroke.  1 brother had multiple myeloma second brother had liver cancer in his 57s Brother had lung cancer. The patient is married and has 2 children she was accompanied today by her son Haley [name] and she has a daughter named Haley [name].  She used to work as a runner, broadcasting/film/video.  She has no history of  smoking, alcohol or drug abuse.    Past Medical History:  Diagnosis Date   Arthritis    knees and left hip   Atrophic gastritis without mention of hemorrhage    Breast cancer (HCC)    Bronchitis, not specified as acute or chronic    Cancer (HCC)    Lung cancer   Cardiac dysrhythmia, unspecified    Cataract    Chronic pancreatitis (HCC)    Complication of anesthesia    Cough    Dysrhythmia    Esophageal reflux    Family history of malignant neoplasm of gastrointestinal tract    Fatty liver    Flatulence, eructation, and gas pain    Gastric erosions    Cameron   Hiatal hernia    Hoarseness    since virus 2 months agp   Irritable bowel syndrome    Irritable bowel syndrome    LGSIL (low grade squamous intraepithelial dysplasia) 11/15/2014   with biopsy of  benign vaginal cyst   Malignant neoplasm of breast (female), unspecified site    Skin Cancer legs, face, shoulder, some basal some squamous   Other chest pain    Personal history of radiation therapy    Pneumonia, organism unspecified(486)    PONV (postoperative nausea and vomiting)    with hemorrhoid surgery years ago, no n/v with breast surgery   Pure hypercholesterolemia    Shortness of breath    with Exertion   Sorethroat    since virus 2 months ago   Symptomatic menopausal or female climacteric states    Thoracic aortic aneurysm without rupture 08/29/2012   08/29/12 discovered on PET/CT   Tubular adenoma of colon  Unspecified essential hypertension    Urinary tract infection, site not specified       Past Surgical History:  Procedure Laterality Date   BREAST LUMPECTOMY Right 03/23/2003   w/ radiation. no chemo.   BREAST SURGERY     BRONCHIAL BIOPSY  07/07/2024   Procedure: BRONCHOSCOPY, WITH BIOPSY;  Surgeon: Kerrin Haley BROCKS, MD;  Location: [facility] ENDOSCOPY;  Service: Thoracic;;   BRONCHIAL BRUSHINGS  07/07/2024   Procedure: BRONCHOSCOPY, WITH BRUSH BIOPSY;  Surgeon: Kerrin Haley BROCKS, MD;   Location: [name] ENDOSCOPY;  Service: Thoracic;;   BRONCHIAL NEEDLE ASPIRATION BIOPSY  07/07/2024   Procedure: BRONCHOSCOPY, WITH NEEDLE ASPIRATION BIOPSY;  Surgeon: Kerrin Haley BROCKS, MD;  Location: [facility] ENDOSCOPY;  Service: Thoracic;;   CATARACT EXTRACTION     Bilateral   COLONOSCOPY     EYE SURGERY     HEMORROIDECTOMY     SKIN CANCER EXCISION     VIDEO ASSISTED THORACOSCOPY (VATS)/WEDGE RESECTION Left 09/19/2012   Procedure: LEFT VIDEO ASSISTED THORACOSCOPY (VATS)/LEFT UPPER LOBE WEDGE RESECTION & NODE DISSECTION;  Surgeon: Haley BROCKS Kerrin, MD;  Location: [facility] OR;  Service: Thoracic;  Laterality: Left;   VIDEO BRONCHOSCOPY WITH ENDOBRONCHIAL NAVIGATION N/A 07/07/2024   Procedure: VIDEO BRONCHOSCOPY WITH ENDOBRONCHIAL NAVIGATION;  Surgeon: Kerrin Haley BROCKS, MD;  Location: [facility] ENDOSCOPY;  Service: Thoracic;  Laterality: N/A;  ROBOTIC BRONCHOSCOPY    Family History  Problem Relation Age of Onset   Heart disease Mother    Colon cancer Father 66   Cancer Father    Stroke Father    Heart disease Brother    Cancer Brother        mass behind lung-died age 98   Multiple myeloma Brother 5       died age 41   Liver cancer Brother    Diabetes Paternal Aunt    Pancreatic cancer Neg Hx    Stomach cancer Neg Hx    Esophageal cancer Neg Hx     Social History Social History[1]  Allergies[2]  Current Outpatient Medications  Medication Sig Dispense Refill   apixaban  (ELIQUIS ) 2.5 MG TABS tablet Take 1 tablet (2.5 mg total) by mouth 2 (two) times daily. 180 tablet 2   atorvastatin  (LIPITOR) 40 MG tablet TAKE 1 TABLET EVERY DAY 90 tablet 3   b complex vitamins tablet Take 1 tablet by mouth daily.     cholecalciferol (VITAMIN D3) 25 MCG (1000 UNIT) tablet Take 1,000 Units by mouth 2 (two) times daily.     Coenzyme Q10 (COQ10 PO) Take 10 mLs by mouth daily.     losartan  (COZAAR ) 50 MG tablet TAKE 1 TABLET EVERY MORNING AND 1/2 TABLET EVERY EVENING 135 tablet 3   metoprolol  tartrate  (LOPRESSOR ) 25 MG tablet Take 0.5 tablets (12.5 mg total) by mouth 2 (two) times daily. As needed for palpitations 30 tablet 0   OVER THE COUNTER MEDICATION Take 2 capsules by mouth daily. Emma digestive supplement     OVER THE COUNTER MEDICATION Take 1 capsule by mouth at bedtime. Omega XL     Probiotic Product (PROBIOTIC DAILY PO) Take 1 capsule by mouth every evening.     verapamil  (CALAN -SR) 120 MG CR tablet Take 1 tablet (120 mg total) by mouth at bedtime. 90 tablet 3   No current facility-administered medications for this visit.    Review of Systems  Constitutional: positive for fatigue Eyes: negative Ears, nose, mouth, throat, and face: negative Respiratory: negative Cardiovascular: negative Gastrointestinal: negative Genitourinary:negative Integument/breast: negative Hematologic/lymphatic: negative Musculoskeletal:negative  Neurological: negative Behavioral/Psych: negative Endocrine: negative Allergic/Immunologic: negative  Physical Exam  MJO:jozmu, healthy, no distress, well nourished, and well developed SKIN: skin color, texture, turgor are normal, no rashes or significant lesions HEAD: Normocephalic, No masses, lesions, tenderness or abnormalities EYES: normal, PERRLA, Conjunctiva are pink and non-injected EARS: External ears normal, Canals clear OROPHARYNX:no exudate, no erythema, and lips, buccal mucosa, and tongue normal  NECK: supple, no adenopathy, no JVD LYMPH:  no palpable lymphadenopathy, no hepatosplenomegaly BREAST:not examined LUNGS: clear to auscultation , and palpation HEART: regular rate & rhythm, no murmurs, and no gallops ABDOMEN:abdomen soft, non-tender, normal bowel sounds, and no masses or organomegaly BACK: Back symmetric, no curvature., No CVA tenderness EXTREMITIES:no joint deformities, effusion, or inflammation, no edema  NEURO: alert & oriented x 3 with fluent speech, no focal motor/sensory deficits  PERFORMANCE STATUS: ECOG  1  LABORATORY DATA: Lab Results  Component Value Date   WBC 4.8 08/24/2024   HGB 11.7 (L) 08/24/2024   HCT 34.9 (L) 08/24/2024   MCV 93.8 08/24/2024   PLT 216 08/24/2024      Chemistry      Component Value Date/Time   NA 139 07/07/2024 1105   NA 139 06/16/2022 0947   K 4.2 07/07/2024 1105   CL 103 07/07/2024 1105   CO2 25 07/07/2024 1105   BUN 15 07/07/2024 1105   BUN 21 06/16/2022 0947   CREATININE 0.94 07/07/2024 1105   CREATININE 0.80 09/05/2015 0854      Component Value Date/Time   CALCIUM  9.5 07/07/2024 1105   ALKPHOS 64 06/27/2024 1321   AST 29 06/27/2024 1321   ALT 20 06/27/2024 1321   BILITOT 1.4 (H) 06/27/2024 1321   BILITOT 1.3 (H) 06/16/2022 0947       RADIOGRAPHIC STUDIES: No results found.  ASSESSMENT: This is a very pleasant 89 years old Haley [name] female diagnosed with a stage IIb (T3, N0, M0) non-small cell lung cancer, adenocarcinoma presented with right upper lobe and right middle lobe nodule diagnosed in December 2025.   PLAN: I had a lengthy discussion with the patient and her son today about her current disease stage, prognosis and treatment options.  I personally independently reviewed the imaging studies as well as the pathology report and discussed the result with the patient and her son.  Assessment and Plan Assessment & Plan Stage IIB non-small cell lung cancer (adenocarcinoma), right lung Newly diagnosed right upper and middle lobe adenocarcinoma, histologically similar to prior lung cancer. PET scan and biopsy confirmed diagnosis. Radiation therapy was selected as definitive treatment due to advanced age, prior left upper lobe resection, and tumor proximity to the heart, precluding surgery. Chemotherapy, immunotherapy, and targeted therapy are not indicated given age, comorbidities, and current disease status. She is tolerating radiation with mild fatigue and chronic cough, without new or worsening symptoms. Plan is curative intent with radiation  and close post-treatment surveillance. If progression or recurrence occurs, molecular testing will be considered for actionable mutations and targeted therapy. Risks of more aggressive interventions were discussed in the context of age and comorbidities. - Continue and complete current course of radiation therapy (BRT) for right lung lesions. - Order CT chest four months post-radiation to assess response. - Schedule follow-up in four months to review imaging and clinical status. - If progression or recurrence, send prior biopsy specimen for molecular testing and consider targeted therapy. - Continue surveillance with chest imaging every six months after initial post-radiation scan if no progression. - No current indication for chemotherapy or  immunotherapy.  History of left upper lobe lung adenocarcinoma, status post wedge resection Remote left upper lobe adenocarcinoma treated with wedge resection in 2014. - Continue routine surveillance per lung cancer follow-up guidelines. She was advised to call immediately if she has any other concerning symptoms in the interval.  The patient voices understanding of current disease status and treatment options and is in agreement with the current care plan.  All questions were answered. The patient knows to call the clinic with any problems, questions or concerns. We can certainly see the patient much sooner if necessary.  Thank you so much for allowing me to participate in the care of Haley [name]. I will continue to follow up the patient with you and assist in her care. The total time spent in the appointment was 60 minutes including review of chart and various tests results, discussions about plan of care and coordination of care plan .   Disclaimer: This note was dictated with voice recognition software. Similar sounding words can inadvertently be transcribed and may not be corrected upon review.   Haley [name] Haley August 24, 2024, 2:23  PM        [1]  Social History Tobacco Use   Smoking status: Never   Smokeless tobacco: Never   Tobacco comments:    Never smoked 06/28/24  Vaping Use   Vaping status: Never Used  Substance Use Topics   Alcohol use: No    Alcohol/week: 0.0 standard drinks of alcohol   Drug use: No  [2] No Known Allergies  "

## 2024-08-25 ENCOUNTER — Encounter (HOSPITAL_COMMUNITY): Payer: Self-pay

## 2024-08-25 ENCOUNTER — Ambulatory Visit: Admitting: Radiation Oncology

## 2024-08-28 ENCOUNTER — Ambulatory Visit

## 2024-08-29 ENCOUNTER — Other Ambulatory Visit: Payer: Self-pay

## 2024-08-29 ENCOUNTER — Ambulatory Visit
Admission: RE | Admit: 2024-08-29 | Discharge: 2024-08-29 | Attending: Radiation Oncology | Admitting: Radiation Oncology

## 2024-08-29 LAB — RAD ONC ARIA SESSION SUMMARY
Course Elapsed Days: 15
Plan Fractions Treated to Date: 9
Plan Prescribed Dose Per Fraction: 5 Gy
Plan Total Fractions Prescribed: 10
Plan Total Prescribed Dose: 50 Gy
Reference Point Dosage Given to Date: 45 Gy
Reference Point Session Dosage Given: 5 Gy
Session Number: 9

## 2024-08-30 ENCOUNTER — Ambulatory Visit
Admission: RE | Admit: 2024-08-30 | Discharge: 2024-08-30 | Attending: Radiation Oncology | Admitting: Radiation Oncology

## 2024-08-30 ENCOUNTER — Other Ambulatory Visit: Payer: Self-pay

## 2024-08-30 LAB — RAD ONC ARIA SESSION SUMMARY
Course Elapsed Days: 16
Plan Fractions Treated to Date: 10
Plan Prescribed Dose Per Fraction: 5 Gy
Plan Total Fractions Prescribed: 10
Plan Total Prescribed Dose: 50 Gy
Reference Point Dosage Given to Date: 50 Gy
Reference Point Session Dosage Given: 5 Gy
Session Number: 10

## 2024-08-31 NOTE — Radiation Completion Notes (Signed)
 Patient Name: Haley [name], Haley [name] MRN: [id number] Date of Birth: Oct 18, 1933 Referring Physician: ELSPETH MILLERS, M.D. Date of Service: 2024-08-31 Radiation Oncologist: Lynwood Nasuti, M.D. [facility]                             RADIATION ONCOLOGY END OF TREATMENT NOTE     Diagnosis: C34.11 Malignant neoplasm of upper lobe, right bronchus or lung; C34.2 Malignant neoplasm of middle lobe, bronchus or lung Staging on 2024-08-24: Primary adenocarcinoma of upper lobe of right lung (HCC) T=cT3, N=cN0, M=cM0 Intent: Curative     ==========DELIVERED PLANS==========  First Treatment Date: 2024-08-14 Last Treatment Date: 2024-08-30   Plan Name: Lung_RUL_SBRT Site: Lung, Right Technique: SBRT/SRT-IMRT Mode: Photon Dose Per Fraction: 18 Gy Prescribed Dose (Delivered / Prescribed): 54 Gy / 54 Gy Prescribed Fxs (Delivered / Prescribed): 3 / 3   Plan Name: Lung_R Site: Lung, Right Technique: IMRT Mode: Photon Dose Per Fraction: 5 Gy Prescribed Dose (Delivered / Prescribed): 50 Gy / 50 Gy Prescribed Fxs (Delivered / Prescribed): 10 / 10     ==========ON TREATMENT VISIT DATES========== 2024-08-15, 2024-08-15, 2024-08-17, 2024-08-17, 2024-08-22, 2024-08-22     ==========UPCOMING VISITS========== 09/26/2024 [facility]-RADIATION ONC FOLLOW UP 15 Wyatt Czar M, PA-C        ==========APPENDIX - ON TREATMENT VISIT NOTES==========   See weekly On Treatment Notes in Epic for details in the Media tab (listed as Progress notes on the On Treatment Visit Dates listed above).

## 2024-09-26 ENCOUNTER — Ambulatory Visit: Admitting: Radiology

## 2024-12-13 ENCOUNTER — Inpatient Hospital Stay

## 2024-12-20 ENCOUNTER — Inpatient Hospital Stay: Admitting: Internal Medicine

## 2025-01-30 ENCOUNTER — Ambulatory Visit (HOSPITAL_COMMUNITY): Admitting: Nurse Practitioner
